# Patient Record
Sex: Male | Born: 1935 | Race: White | Hispanic: No | Smoking: Never smoker
Health system: Southern US, Community
[De-identification: ages and names within clinical notes are randomized; demographics above are authoritative.]

## PROBLEM LIST (undated history)

## (undated) DIAGNOSIS — F0390 Unspecified dementia without behavioral disturbance: Secondary | ICD-10-CM

## (undated) DIAGNOSIS — L409 Psoriasis, unspecified: Secondary | ICD-10-CM

## (undated) DIAGNOSIS — E785 Hyperlipidemia, unspecified: Secondary | ICD-10-CM

## (undated) DIAGNOSIS — I10 Essential (primary) hypertension: Secondary | ICD-10-CM

## (undated) DIAGNOSIS — E559 Vitamin D deficiency, unspecified: Secondary | ICD-10-CM

## (undated) DIAGNOSIS — E538 Deficiency of other specified B group vitamins: Secondary | ICD-10-CM

---

## 2016-09-30 ENCOUNTER — Encounter: Payer: Self-pay | Admitting: Emergency Medicine

## 2016-09-30 ENCOUNTER — Emergency Department: Payer: Medicare Other

## 2016-09-30 ENCOUNTER — Emergency Department
Admission: EM | Admit: 2016-09-30 | Discharge: 2016-09-30 | Disposition: A | Discharge status: Home / Self Care | Payer: Medicare Other | Attending: Emergency Medicine | Admitting: Emergency Medicine

## 2016-09-30 DIAGNOSIS — Y999 Unspecified external cause status: Secondary | ICD-10-CM | POA: Insufficient documentation

## 2016-09-30 DIAGNOSIS — S0990XA Unspecified injury of head, initial encounter: Secondary | ICD-10-CM | POA: Diagnosis present

## 2016-09-30 DIAGNOSIS — Y939 Activity, unspecified: Secondary | ICD-10-CM | POA: Insufficient documentation

## 2016-09-30 DIAGNOSIS — X58XXXA Exposure to other specified factors, initial encounter: Secondary | ICD-10-CM | POA: Diagnosis not present

## 2016-09-30 DIAGNOSIS — Y929 Unspecified place or not applicable: Secondary | ICD-10-CM | POA: Insufficient documentation

## 2016-09-30 DIAGNOSIS — S0101XA Laceration without foreign body of scalp, initial encounter: Secondary | ICD-10-CM | POA: Diagnosis not present

## 2016-09-30 HISTORY — DX: Unspecified dementia, unspecified severity, without behavioral disturbance, psychotic disturbance, mood disturbance, and anxiety: F03.90

## 2016-09-30 LAB — ETHANOL: Alcohol, Ethyl (B): 56 mg/dL — ABNORMAL HIGH (ref <5)

## 2016-09-30 MED ORDER — LIDOCAINE HCL (PF) 1 % IJ SOLN
INTRAMUSCULAR | Status: AC
  Administered 2016-09-30: 5 mL
  Filled 2016-09-30: qty 5

## 2016-09-30 NOTE — ED Notes (Signed)
Pt back from CT

## 2016-09-30 NOTE — ED Provider Notes (Signed)
Time Seen: Approximately 1349  I have reviewed the triage notes  Chief Complaint: Head Laceration   History of Present Illness: Kristopher Evans is a 81 y.o. male *who [facility] for some bleeding noticed over the back of his head. Patient had a fall and not sure exactly what time it occurred etc. Patient has a history of dementia. There is very mobile Patient "" snuck out"" last night to get some alcohol and they found himthis morning walking around the dry blood over the back of his head. Patient's not sure how the injury occurred. He denies any other locations of pain such as neck, thoracic, lumbar spine region   No past medical history on file.  There are no active problems to display for this patient.   No past surgical history on file.  No past surgical history on file.    Allergies:  Patient has no allergy information on record.  Family History: No family history on file.  Social History: Social History  Substance Use Topics  . Smoking status: Not on file  . Smokeless tobacco: Not on file  . Alcohol use Not on file     Review of Systems:   10 point review of systems was performed and was otherwise negative: atient has a history of dementia is able to offer some review of systems andfrom the family at the bedside Constitutional: No fever Eyes: No visual disturbances ENT: No sore throat, ear pain Cardiac: No chest pain Respiratory: No shortness of breath, wheezing, or stridor Abdomen: No abdominal pain, no vomiting, No diarrhea Endocrine: No weight loss, No night sweats Extremities: No peripheral edema, cyanosis Skin: No rashes, easy bruising Neurologic: No focal weakness, trouble with speech or swollowing Urologic: No dysuria, Hematuria, or urinary frequency   Physical Exam:  ED Triage Vitals  Enc Vitals Group     BP 09/30/16 1330 131/73     Pulse Rate 09/30/16 1330 (!) 101     Resp 09/30/16 1330 18     Temp 09/30/16 1330 98 F (36.7 C)      Temp src --      SpO2 09/30/16 1330 100 %     Weight 09/30/16 1331 175 lb 12.8 oz (79.7 kg)     Height 09/30/16 1331 5\' 8"  (1.727 m)     Head Circumference --      Peak Flow --      Pain Score --      Pain Loc --      Pain Edu? --      Excl. in GC? --     General: Awake , Alert , and Oriented times 3; GCS 15 Head: Normal cephalic , patient has a flap-like lesion with sharp edges in the shape of a triangle approximately 5 cm in size Minimal active bleeding and no obvious issection to the galea Eyes: Pupils equal , round, reactive to light Nose/Throat: No nasal drainage, patent upper airway without erythema or exudate.  Neck: Supple, Full range of motion, No anterior adenopathy or palpable thyroid masses Lungs: Clear to ascultation without wheezes , rhonchi, or rales Heart: Regular rate, regular rhythm without murmurs , gallops , or rubs Abdomen: Soft, non tender without rebound, guarding , or rigidity; bowel sounds positive and symmetric in all 4 quadrants. No organomegaly .        Extremities: 2 plus symmetric pulses. No edema, clubbing or cyanosis Neurologic: normal ambulation, Motor symmetric without deficits, sensory intact Skin: warm, dry, no rashes  Labs:   All laboratory work was reviewed including any pertinent negatives or positives listed below:  Labs Reviewed  ETHANOL    Radiology:  "Ct Head Wo Contrast  Result Date: 09/30/2016 CLINICAL DATA:  Posterior head injury. EXAM: CT HEAD WITHOUT CONTRAST TECHNIQUE: Contiguous axial images were obtained from the base of the skull through the vertex without intravenous contrast. COMPARISON:  None. FINDINGS: Brain: Diffusely enlarged ventricles and subarachnoid spaces. Patchy white matter low density in both cerebral hemispheres. 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 No intracranial hemorrhage, mass lesion or CT evidence of acute infarction. Vascular: Bilateral vertebral and cavernous carotid artery  atheromatous calcifications. Skull: Normal. Negative for fracture or focal lesion. Sinuses/Orbits: No acute finding. Other: Small posterior head laceration on the right. IMPRESSION: 1. No skull fracture or intracranial hemorrhage. 2. Atrophy and chronic small vessel white matter ischemic changes. 3. 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 Electronically Signed   By: Beckie SaltsSteven  Reid M.D.   On: 09/30/2016 14:17  "  I personally reviewed the radiologic studies   Procedures: * Patient's scalp was prepped and draped in sterile fashion and anesthetized locally with 1% lidocaine without epinephrine. Patient had 6 individual staples established. Patient tolerated procedure well and his wound was cleaned and dressed by the nursing staff.    ED Course:  Patient's stay here was uneventful and the patient remained hemodynamically stable. We are still awaiting his blood alcohol level. He is at his baseline mental status.the patient has no other signs of trauma and I felt that other laboratory work and x-ray evaluation was not necessary. His scalp laceration has very defined edges that he may have struck was sharp in nature.  Clinical Course      Assessment: * acute closed head injury 5 cm single layer closure non-complicated scalp laceration    Plan: Out patient Patient was advised to return immediately if condition worsens. Patient was advised to follow up with their primary care physician or other specialized physicians involved in their outpatient care. The patient and/or family member/power of attorney had laboratory results reviewed at the bedside. All questions and concerns were addressed and appropriate discharge instructions were distributed by the nursing staff.            Jennye MoccasinBrian S Quigley, MD 09/30/16 [phone]

## 2016-09-30 NOTE — Discharge Instructions (Signed)
Please see the current dressing in place for at least 24 hours. Staple removal in 1 week. After 24 hours, he can take the bandage off and clean with hydrogen peroxide and apply Neosporin.  Patient was advised to return immediately if condition worsens. Patient was advised to follow up with their primary care physician or other specialized physicians involved in their outpatient care. The patient and/or family member/power of attorney had laboratory results reviewed at the bedside. All questions and concerns were addressed and appropriate discharge instructions were distributed by the nursing staff.

## 2016-09-30 NOTE — ED Notes (Signed)
Cleaned wound with NS , dressed with gauze and wrapped with kerlex

## 2016-09-30 NOTE — ED Triage Notes (Signed)
[facility] bleeding in the back of his head due to unknown cause. Pt with dementia and snuck out last night for alcohol ; found this morning walking around with dried blood on the back of his head by staff

## 2016-11-29 DIAGNOSIS — F0280 Dementia in other diseases classified elsewhere without behavioral disturbance: Secondary | ICD-10-CM | POA: Diagnosis not present

## 2016-11-29 DIAGNOSIS — I739 Peripheral vascular disease, unspecified: Secondary | ICD-10-CM | POA: Diagnosis not present

## 2016-11-29 DIAGNOSIS — E782 Mixed hyperlipidemia: Secondary | ICD-10-CM | POA: Diagnosis not present

## 2016-11-29 DIAGNOSIS — F1020 Alcohol dependence, uncomplicated: Secondary | ICD-10-CM | POA: Diagnosis not present

## 2016-11-29 DIAGNOSIS — I10 Essential (primary) hypertension: Secondary | ICD-10-CM | POA: Diagnosis not present

## 2016-11-29 DIAGNOSIS — G300 Alzheimer's disease with early onset: Secondary | ICD-10-CM | POA: Diagnosis not present

## 2016-12-07 DIAGNOSIS — Z111 Encounter for screening for respiratory tuberculosis: Secondary | ICD-10-CM | POA: Diagnosis not present

## 2016-12-14 ENCOUNTER — Emergency Department: Payer: PPO

## 2016-12-14 ENCOUNTER — Emergency Department
Admission: EM | Admit: 2016-12-14 | Discharge: 2016-12-15 | Disposition: A | Discharge status: Home / Self Care | Payer: PPO | Attending: Emergency Medicine | Admitting: Emergency Medicine

## 2016-12-14 DIAGNOSIS — R410 Disorientation, unspecified: Secondary | ICD-10-CM | POA: Diagnosis not present

## 2016-12-14 DIAGNOSIS — G3109 Other frontotemporal dementia: Secondary | ICD-10-CM | POA: Diagnosis not present

## 2016-12-14 DIAGNOSIS — F02818 Dementia in other diseases classified elsewhere, unspecified severity, with other behavioral disturbance: Secondary | ICD-10-CM

## 2016-12-14 DIAGNOSIS — F0281 Dementia in other diseases classified elsewhere with behavioral disturbance: Secondary | ICD-10-CM

## 2016-12-14 DIAGNOSIS — R4182 Altered mental status, unspecified: Secondary | ICD-10-CM | POA: Diagnosis not present

## 2016-12-14 LAB — COMPREHENSIVE METABOLIC PANEL
ALBUMIN: 4 g/dL (ref 3.5–5.0)
ALT: 15 U/L — AB (ref 17–63)
AST: 21 U/L (ref 15–41)
Alkaline Phosphatase: 70 U/L (ref 38–126)
Anion gap: 6 (ref 5–15)
BILIRUBIN TOTAL: 0.7 mg/dL (ref 0.3–1.2)
BUN: 18 mg/dL (ref 6–20)
CHLORIDE: 103 mmol/L (ref 101–111)
CO2: 29 mmol/L (ref 22–32)
CREATININE: 1.23 mg/dL (ref 0.61–1.24)
Calcium: 8.8 mg/dL — ABNORMAL LOW (ref 8.9–10.3)
GFR calc Af Amer: >60 mL/min (ref >60)
GFR calc non Af Amer: 54 mL/min — ABNORMAL LOW (ref >60)
GLUCOSE: 104 mg/dL — AB (ref 65–99)
POTASSIUM: 3.5 mmol/L (ref 3.5–5.1)
Sodium: 138 mmol/L (ref 135–145)
Total Protein: 6.8 g/dL (ref 6.5–8.1)

## 2016-12-14 LAB — URINALYSIS, COMPLETE (UACMP) WITH MICROSCOPIC
BACTERIA UA: NONE SEEN
Bilirubin Urine: NEGATIVE
Glucose, UA: NEGATIVE mg/dL
HGB URINE DIPSTICK: NEGATIVE
Ketones, ur: NEGATIVE mg/dL
Leukocytes, UA: NEGATIVE
Nitrite: NEGATIVE
PROTEIN: NEGATIVE mg/dL
RBC / HPF: NONE SEEN RBC/hpf (ref 0–5)
Specific Gravity, Urine: 1.011 (ref 1.005–1.030)
pH: 7 (ref 5.0–8.0)

## 2016-12-14 LAB — CBC WITH DIFFERENTIAL/PLATELET
Basophils Absolute: 0.1 10*3/uL (ref 0–0.1)
Basophils Relative: 1 %
Eosinophils Absolute: 0.1 10*3/uL (ref 0–0.7)
Eosinophils Relative: 1 %
HEMATOCRIT: 40.4 % (ref 40.0–52.0)
HEMOGLOBIN: 13.6 g/dL (ref 13.0–18.0)
LYMPHS ABS: 3.2 10*3/uL (ref 1.0–3.6)
Lymphocytes Relative: 29 %
MCH: 32.1 pg (ref 26.0–34.0)
MCHC: 33.7 g/dL (ref 32.0–36.0)
MCV: 95.3 fL (ref 80.0–100.0)
MONO ABS: 0.9 10*3/uL (ref 0.2–1.0)
MONOS PCT: 8 %
NEUTROS ABS: 6.7 10*3/uL — AB (ref 1.4–6.5)
NEUTROS PCT: 61 %
Platelets: 271 10*3/uL (ref 150–440)
RBC: 4.24 MIL/uL — ABNORMAL LOW (ref 4.40–5.90)
RDW: 14.1 % (ref 11.5–14.5)
WBC: 11 10*3/uL — ABNORMAL HIGH (ref 3.8–10.6)

## 2016-12-14 LAB — SALICYLATE LEVEL: Salicylate Lvl: <7 mg/dL (ref 2.8–30.0)

## 2016-12-14 LAB — ACETAMINOPHEN LEVEL: Acetaminophen (Tylenol), Serum: <10 ug/mL — ABNORMAL LOW (ref 10–30)

## 2016-12-14 NOTE — ED Notes (Signed)
Pt provided with meal tray. Pt is dry, continues to decline offer for toilet and obtain a urine sample.

## 2016-12-14 NOTE — ED Notes (Signed)
Attempted to reach pt's niece at phone number provided. She does not answer. Called lamont, Interior and spatial designerdirector of [facility]. lamont states pt's niece "want's him evaulated and admitted." lamont to attempt to reach niece and have her call ed and speak with md.

## 2016-12-14 NOTE — BH Assessment (Signed)
Assessment Note  Kristopher Evans is an 82 y.o. male, with a prior history of dementia,  presenting to the ED after being found by EMS wandering around trying to get into vehicles.  [place].  Pt still thinks he lives in [place].  [facility].  Pt is oriented x 1.  Pt is not oriented to place and time.  Pt did not know the year and who the current president is [place].  Pt did not know he was in the hospitalization despite being told several times.  Pt thinks he's in a recreational facility.  Pt denies SI/HI.  Diagnosis: Dementia  Past Medical History:  Past Medical History:  Diagnosis Date  . Dementia     No past surgical history on file.  Family History: No family history on file.  Social History:  reports that he drinks alcohol. His tobacco and drug histories are not on file.  Additional Social History:  Alcohol / Drug Use Pain Medications: See PTA Prescriptions: See PTA Over the Counter: See PTA History of alcohol / drug use?: No history of alcohol / drug abuse  CIWA: CIWA-Ar BP: 129/84 Pulse Rate: 61 COWS:    Allergies:  Allergies  Allergen Reactions  . Penicillins     Home Medications:  (Not in a hospital admission)  OB/GYN Status:  No LMP for male patient.  General Assessment Data Location of Assessment: [facility] ED TTS Assessment: In system Is this a Tele or Face-to-Face Assessment?: Face-to-Face Is this an Initial Assessment or a Re-assessment for this encounter?: Initial Assessment Marital status: Single Maiden name: n/a Is patient pregnant?: No Pregnancy Status: No Living Arrangements: Other (Comment) ([facility]) Can pt return to current living arrangement?: Yes Admission Status: Voluntary Is patient capable of signing voluntary admission?: No Referral Source: Other Insurance type: Medicare     Crisis Care Plan Living Arrangements: Other (Comment)  ([facility]) Legal Guardian: Other: (self) Name of Psychiatrist: unknown Name of Therapist: unknown  Education Status Is patient currently in school?: No Current Grade: n/a Highest grade of school patient has completed: n/a Name of school: n/a Contact person: n/a  Risk to self with the past 6 months Suicidal Ideation: No Has patient been a risk to self within the past 6 months prior to admission? : No Suicidal Intent: No Has patient had any suicidal intent within the past 6 months prior to admission? : No Is patient at risk for suicide?: No Suicidal Plan?: No Has patient had any suicidal plan within the past 6 months prior to admission? : No Access to Means: No What has been your use of drugs/alcohol within the last 12 months?: None reported Previous Attempts/Gestures: No How many times?: 0 Other Self Harm Risks: Pt's altered mental state Triggers for Past Attempts: None known Intentional Self Injurious Behavior: None Family Suicide History: Unknown Recent stressful life event(s): Other (Comment) (Pt was moved from his other assisted living facility) Persecutory voices/beliefs?: No Depression: No Substance abuse history and/or treatment for substance abuse?: No Suicide prevention information given to non-admitted patients: Not applicable  Risk to Others within the past 6 months Homicidal Ideation: No Does patient have any lifetime risk of violence toward others beyond the six months prior to admission? : Unknown Thoughts of Harm to Others: No Current Homicidal Intent: No Current Homicidal Plan: No Access to Homicidal Means: No Identified Victim: none identified History of  harm to others?: No Assessment of Violence: None Noted Violent Behavior Description: none identified Does patient have access to weapons?: No Criminal Charges Pending?: No Does patient have a court date: No Is patient on probation?: No  Psychosis Hallucinations: None noted Delusions:  None noted  Mental Status Report Appearance/Hygiene: In scrubs Eye Contact: Good Motor Activity: Freedom of movement Speech: Incoherent Level of Consciousness: Alert Mood: Pleasant Affect: Appropriate to circumstance Anxiety Level: Minimal Thought Processes: Flight of Ideas Judgement: Unimpaired Orientation: Person Obsessive Compulsive Thoughts/Behaviors: None  Cognitive Functioning Concentration: Fair Memory: Recent Impaired IQ: Average Insight: Good Impulse Control: Good Appetite: Good Weight Loss: 0 Weight Gain: 0 Sleep: No Change Vegetative Symptoms: None  ADLScreening [facility] Assessment Services) Patient's cognitive ability adequate to safely complete daily activities?: Yes Patient able to express need for assistance with ADLs?: Yes Independently performs ADLs?: Yes (appropriate for developmental age)  Prior Inpatient Therapy Prior Inpatient Therapy: No Prior Therapy Dates: na Prior Therapy Facilty/Provider(s): na Reason for Treatment: na  Prior Outpatient Therapy Prior Outpatient Therapy: No Prior Therapy Dates: na Prior Therapy Facilty/Provider(s): na Reason for Treatment: na Does patient have an ACCT team?: No Does patient have Intensive In-House Services?  : No Does patient have [facility] services? : No Does patient have P4CC services?: No  ADL Screening (condition at time of admission) Patient's cognitive ability adequate to safely complete daily activities?: Yes Patient able to express need for assistance with ADLs?: Yes Independently performs ADLs?: Yes (appropriate for developmental age)       Abuse/Neglect Assessment (Assessment to be complete while patient is alone) Physical Abuse: Denies Verbal Abuse: Denies Sexual Abuse: Denies Exploitation of patient/patient's resources: Denies Self-Neglect: Denies Values / Beliefs Cultural Requests During Hospitalization: None Spiritual Requests During Hospitalization: None Consults Spiritual Care  Consult Needed: No Social Work Consult Needed: No Merchant navy officer (For Healthcare) Does Patient Have a Medical Advance Directive?: No (Pt has dementia and not able to answer.) Would patient like information on creating a medical advance directive?:  (Pt has dementia and not able to answer)    Additional Information 1:1 In Past 12 Months?: No CIRT Risk: No Elopement Risk: No Does patient have medical clearance?: No     Disposition:  Disposition Initial Assessment Completed for this Encounter: Yes Disposition of Patient: Other dispositions Other disposition(s): Other (Comment) (Pending Psych MD and Social Work consult)  On Site Evaluation by:   Reviewed with Physician:    [place] 12/14/2016 11:34 PM

## 2016-12-14 NOTE — ED Provider Notes (Signed)
[facility] Emergency Department Provider Note   ____________________________________________   None    (approximate)  I have reviewed the triage vital signs and the nursing notes.   HISTORY  Chief Complaint Altered Mental Status   HPI Kristopher Evans is a 82 y.o. male patient who used to live in [place] apparently traveled here recently is in a health care Hospital. He was found wandering around trying to get into cars outside. EMS picked him up he thought at that time he was in bed in [place]. Patient has been reoriented to the fact that he is in a hospital in [place] several times and still thinks he is in [place]. He does not know the day date the month or the year and does not know who the president is. He does not have any complaints of headache chest pain abdominal pain or any other thing. Review of records shows that he has been here at least since 2016 probably 15 as a diagnosis of Alzheimer's dementia without behavioral disturbance and essential hypertension   Past Medical History:  Diagnosis Date  . Dementia     There are no active problems to display for this patient.   No past surgical history on file.  Prior to Admission medications   Not on File    Allergies Penicillins  No family history on file.  Social History Social History  Substance Use Topics  . Smoking status: Unknown If Ever Smoked  . Smokeless tobacco: Not on file  . Alcohol use Yes    Review of Systems Constitutional: No fever/chills Eyes: No visual changes. ENT: No sore throat. Cardiovascular: Denies chest pain. Respiratory: Denies shortness of breath. Gastrointestinal: No abdominal pain.  No nausea, no vomiting.  No diarrhea.  No constipation. Genitourinary: Negative for dysuria. Musculoskeletal: Negative for back pain. Skin: Negative for rash. Neurological: Negative for headaches, focal weakness or numbness.  10-point ROS otherwise  negative.  ____________________________________________   PHYSICAL EXAM:  VITAL SIGNS: ED Triage Vitals [12/14/16 2049]  Enc Vitals Group     BP (!) 173/99     Pulse Rate 72     Resp 16     Temp 98.6 F (37 C)     Temp Source Oral     SpO2 99 %     Weight 134 lb (60.8 kg)     Height      Head Circumference      Peak Flow      Pain Score      Pain Loc      Pain Edu?      Excl. in GC?     Constitutional: Alert and oriented. Well appearing and in no acute distress. Eyes: Conjunctivae are normal. PERRL. EOMI. Head: Atraumatic. Nose: No congestion/rhinnorhea. Mouth/Throat: Mucous membranes are moist.  Oropharynx non-erythematous. Neck: No stridor.  Cardiovascular: Normal rate, regular rhythm. Grossly normal heart sounds.  Good peripheral circulation. Respiratory: Normal respiratory effort.  No retractions. Lungs CTAB. Gastrointestinal: Soft and nontender. No distention. No abdominal bruits. No CVA tenderness. Musculoskeletal: No lower extremity tenderness nor edema.  No joint effusions. Neurologic:  Normal speech and language. No gross focal neurologic deficits are appreciated. No gait instability. Skin:  Skin is warm, dry and intact. No rash noted. Psychiatric: Mood and affect are normal. Speech and behavior are normal.  ____________________________________________   LABS (all labs ordered are listed, but only abnormal results are displayed)  Labs Reviewed  COMPREHENSIVE METABOLIC PANEL - Abnormal; Notable for the following:  Result Value   Glucose, Bld 104 (*)    Calcium 8.8 (*)    ALT 15 (*)    GFR calc non Af Amer 54 (*)    All other components within normal limits  ACETAMINOPHEN LEVEL - Abnormal; Notable for the following:    Acetaminophen (Tylenol), Serum <10 (*)    All other components within normal limits  CBC WITH DIFFERENTIAL/PLATELET - Abnormal; Notable for the following:    WBC 11.0 (*)    RBC 4.24 (*)    Neutro Abs 6.7 (*)    All other  components within normal limits  SALICYLATE LEVEL  URINALYSIS, COMPLETE (UACMP) WITH MICROSCOPIC  CBG MONITORING, ED   ____________________________________________  EKG  EKG shows normal sinus rhythm rate of 79 normal axis no acute ST-T wave changes there is early R wave progression possibly due to lead placement ____________________________________________  RADIOLOGY  Study Result   CLINICAL DATA:  Confusion.  Hypertension.  EXAM: CT HEAD WITHOUT CONTRAST  TECHNIQUE: Contiguous axial images were obtained from the base of the skull through the vertex without intravenous contrast.  COMPARISON:  Head CT 09/30/2016  FINDINGS: Brain: No evidence of acute infarction, hemorrhage, hydrocephalus, extra-axial collection or mass lesion/mass effect. Stable generalized atrophy. Stable chronic small vessel ischemia. Encephalomalacia in the left frontal and parietal lobes and left cerebellum, unchanged from prior exam. Probable remote lacunar infarcts involving right basal ganglia.  Vascular: Atherosclerosis of skullbase vasculature without hyperdense vessel or abnormal calcification.  Skull: Normal. Negative for fracture or focal lesion.  Sinuses/Orbits: Paranasal sinuses and mastoid air cells are clear. The visualized orbits are unremarkable.  Other: None.  IMPRESSION: 1.  No acute intracranial abnormality. 2. Stable atrophy, chronic, and remote ischemia.   Electronically Signed   By: Rubye OaksMelanie  Ehinger M.D.   On: 12/14/2016 21:31   tudy Result   CLINICAL DATA:  Confusion.  Severe hypertension.  EXAM: PORTABLE CHEST 1 VIEW  COMPARISON:  None.  FINDINGS: Normal heart size and mediastinal contours. Atherosclerosis of the aortic arch. Mild interstitial coarsening which is likely chronic. No focal airspace disease, pleural effusion or pneumothorax. No acute osseous abnormality is seen.  IMPRESSION: Mild interstitial coarsening, likely  chronic.  Aortic atherosclerosis.   Electronically Signed   By: Rubye OaksMelanie  Ehinger M.D.   On: 12/14/2016 21:39     ____________________________________________   PROCEDURES  Procedure(s) performed:   Procedures  Critical Care performed:   ____________________________________________   INITIAL IMPRESSION / ASSESSMENT AND PLAN / ED COURSE  Pertinent labs & imaging results that were available during my care of the patient were reviewed by me and considered in my medical decision making (see chart for details).   The patient's niece, Ms Cristal DeerBoyleston (i believe that's the spelling) wishes to be called at the number below when the patient's disposition is made.  [phone]  Patient was in [place] up until 2 weeks ago ____________________________________________   FINAL CLINICAL IMPRESSION(S) / ED DIAGNOSES  Final diagnoses:  Other frontotemporal dementia with behavioral disturbance      NEW MEDICATIONS STARTED DURING THIS VISIT:  New Prescriptions   No medications on file     Note:  This document was prepared using Dragon voice recognition software and may include unintentional dictation errors.    Arnaldo NatalPaul F Ibeth Fahmy, MD 12/14/16 [phone]

## 2016-12-14 NOTE — ED Notes (Signed)
tts in to see pt.

## 2016-12-14 NOTE — ED Notes (Signed)
Pt has yellow metal necklace, yellow metal watch, grey metal glasses, grey metal bracelet, tan jacket, brown shoes, black pants and grey shirt.

## 2016-12-14 NOTE — ED Notes (Signed)
Pt watching tv after returning from ct scan. Pt denies needs.

## 2016-12-14 NOTE — ED Notes (Signed)
Pt declines need to urinate. Pt has depends in place, but states "i pee on my own". Pt states will call rn when he has to urinate.

## 2016-12-14 NOTE — ED Notes (Signed)
Patient transported to CT 

## 2016-12-14 NOTE — ED Notes (Signed)
Per ems on further clarification, pt was at [facility] an [facility]. Per ems pt "went off" on the propriator thus prompting her to call ems.

## 2016-12-14 NOTE — ED Triage Notes (Signed)
Per ems pt was found wandering attempting to get into vehicles. Pt unsure of where he is, thinks he in in [place]. Pt is alert to self only at this  Time. perrl 2mm and brisk. Pt moving all extremities.

## 2016-12-15 ENCOUNTER — Encounter: Payer: Self-pay | Admitting: Emergency Medicine

## 2016-12-15 MED ORDER — LORAZEPAM 0.5 MG PO TABS: 0.5000 mg | ORAL_TABLET | Freq: Three times a day (TID) | ORAL | 0 refills | Status: AC | PRN

## 2016-12-15 NOTE — Progress Notes (Signed)
LCSW met with patient and completed assessment. Patient has memory issues and unable to remember phone numbers and names.  LCSW contacted family members Katharine Look [phone]) She confirmed that patient is currently in an assisted living facility and has become anxious at night then wanders away. Family requested patient  To be prescribed medication to calm him down/assist with sleep/anxiety for next 3 days until they can bring him to Dr Frazier Richards on Monday.  He has recently transitioned and relocated a month ago and now is a bit disoriented. LCSW agreed to create a helpful series of handouts of SNF, Memory Care facilities list ( short term private pay)[facility] way Resource list, Proofreader,  Senior day program resources, blank manual Fl2 indicated pts family doctor to complete.Dementia Services/[employer] handouts.  They will come and pick up patient in an hour or so. Assessment completed.  [employer] LCSW [phone]

## 2016-12-15 NOTE — ED Notes (Signed)
Pt changed into burgandy scrubs. Pt has retained eyeglasses, all other belongings placed in a labeled bag [facility]. Bag given to noel, rn. Report to noel, rn.

## 2016-12-15 NOTE — ED Provider Notes (Signed)
Clinical Course as of Dec 16 643  Sat Dec 15, 2016  [id number] Reviewed telepsych recommendations.  They did not feel that the patient meets inpatient nor IVC criteria.  They felt that the patient can go home and be supervised in a controlled setting.  However, the psychiatrist was unable to reach the family for collateral.  Social work consult is still pending today and disposition will depend upon the interactions between social work and the family as well as the patient.  [CF]    Clinical Course User Index [CF] Loleta Roseory Braydon Kullman, MD      Loleta Roseory Ruthell Feigenbaum, MD 12/15/16 [phone]

## 2016-12-15 NOTE — ED Notes (Addendum)
DR Hermelinda MedicusAlvarado, [facility]   Per conversation with April, RN, and Jaynie CollinsLamont, Interior and spatial designerdirector at group home, [phone],  pt IS welcome to return to [facility] [address], [phone]

## 2016-12-15 NOTE — Clinical Social Work Note (Signed)
Clinical Social Work Assessment  Patient Details  Name: Kristopher Evans MRN: [id number] Date of Birth: December 21, 1934  Date of referral:  12/15/16               Reason for consult:  Family Concerns, [employer]                Permission sought to share information with:  Family Supports Permission granted to share information::  Yes, Verbal Permission Granted  Name::     Katharine Look [phone] [employer])  Agency::     Relationship::     Contact Information:     Housing/Transportation Living arrangements for the past 2 months:  Apartment (Assisted Living) Source of Information:  Patient, Adult Children Patient Interpreter Needed:  None Criminal Activity/Legal Involvement Pertinent to Current Situation/Hospitalization:  No - Comment as needed Significant Relationships:  Other Family Members Lives with:  Self, Facility Resident Do you feel safe going back to the place where you live?    Need for family participation in patient care:  Yes (Comment)  Care giving concerns: Family would like him to obtain medications to assist with anxiety   Social Worker assessment / plan: LCSW met with patient who is oriented to self x1. He gave verbal consent to speak to his niece but could not remember her name or number. He reports he lives alone in an apartment and is able to manage dressing, walking, showering and eating food. LCSW called Niece and she reported he has recently moved to assisted living and with him walking off and being disoriented they would like him in a Memory care locked unit and medications until he can been seen by  His own doctor. Governor Specking Monday March5th. Safety concerns were reviewed with family. Several resources were compiled and explained to niece for her to assist in meeting this patients current needs. She will pick up patient and return him home and assist him.   Employment status:  Retired Forensic scientist:   Agricultural engineer) PT Recommendations:     Information / Referral to community resources:   Set designer, Dementia services, SNF, Memory Care facilities handout)  Patient/Family's Response to care: Will start the process to have him placed in locked unit  Patient/Family's Understanding of and Emotional Response to Diagnosis, Current Treatment, and Prognosis: patient is not aware he has memory issues. His family has safety concerns and will supervise and place him in locked facility.  Emotional Assessment Appearance:  Appears stated age, Well-Groomed Attitude/Demeanor/Rapport:   (Polite calm) Affect (typically observed):  Appropriate, Unable to Assess Orientation:  Oriented to Self Alcohol / Substance use:  Not Applicable Psych involvement (Current and /or in the community):  No (Comment)  Discharge Needs  Concerns to be addressed:  Care Coordination, Home Safety Concerns Readmission within the last 30 days:  No Current discharge risk:  Lives alone Barriers to Discharge:  No Barriers Identified   Joana Reamer, LCSW 12/15/2016, 8:51 AM

## 2016-12-15 NOTE — ED Notes (Signed)
Pt ambulated to restroom without difficulty

## 2016-12-15 NOTE — ED Provider Notes (Signed)
Patient is a 82 year old male with dementia who is deemed to not meet criteria for inpatient treat him and her IVC. Plan is for social work to follow-up with patient's family for disposition planning.  Physical Exam  BP (!) 148/92 (BP Location: Right Arm)   Pulse 70   Temp 98.3 F (36.8 C) (Oral)   Resp 18   Wt 134 lb (60.8 kg)   SpO2 100%   BMI 20.37 kg/m  ----------------------------------------- 8:51 AM on 12/15/2016 -----------------------------------------   Physical Exam Patient awake and alert at this time. No distress noted. ED Course  Procedures  MDM Social worker, Claudine, able to contact the patient's niece who will be taking the patient home for further care and also to discuss further planning with the patient in a skilled nursing Center or memory care unit. Family is also requesting when necessary Ativan for the patient for sleep.  Feel that this medicine may also be necessary for possible agitation.       Myrna Blazeravid Matthew Schaevitz, MD 12/15/16 [phone]

## 2016-12-15 NOTE — ED Notes (Signed)
Attempted to call pt's niece (?guardian?) no answer

## 2016-12-15 NOTE — NC FL2 (Signed)
  [place] MEDICAID FL2 LEVEL OF CARE SCREENING TOOL     IDENTIFICATION  Patient Name: Kristopher JohnsJohn Reicher Birthdate: 12/30/1934 Sex: male Admission Date (Current Location): 12/14/2016  [facility] [place]   Facility and Address:  [facility], [address], [place], [place] [address]      Provider Number: [id number]  Attending Physician Name and Address:  No att. providers found  Relative Name and Phone Number:       Current Level of Care: (S) Other (Comment) (ALF) Recommended Level of Care: Memory Care Prior Approval Number:    Date Approved/Denied:   PASRR Number: [id number]  Discharge Plan: Other (Comment) (ALF)    Current Diagnoses: There are no active problems to display for this patient.   Orientation RESPIRATION BLADDER Height & Weight     Self  Normal Continent Weight: 134 lb (60.8 kg) Height:     BEHAVIORAL SYMPTOMS/MOOD NEUROLOGICAL BOWEL NUTRITION STATUS      Continent Diet (normal)  AMBULATORY STATUS COMMUNICATION OF NEEDS Skin   Independent Verbally Normal                       Personal Care Assistance Level of Assistance  Bathing, Feeding, Dressing, Total care Bathing Assistance: Limited assistance Feeding assistance: Independent Dressing Assistance: Independent Total Care Assistance: Limited assistance   Functional Limitations Info  Sight, Hearing, Speech Sight Info: Adequate Hearing Info: Adequate Speech Info: Adequate    SPECIAL CARE FACTORS FREQUENCY                       Contractures Contractures Info: Not present    Additional Factors Info  Allergies   Allergies Info: Penicillans           Current Medications (12/15/2016):  This is the current hospital active medication list No current facility-administered medications for this encounter.    Current Outpatient Prescriptions  Medication Sig Dispense Refill  . LORazepam (ATIVAN) 0.5 MG tablet Take 1 tablet (0.5 mg total) by  mouth every 8 (eight) hours as needed for anxiety or sleep. 10 tablet 0     Discharge Medications: Please see discharge summary for a list of discharge medications.  Relevant Imaging Results:  Relevant Lab Results:   Additional Information SSN [id number]  Cheron SchaumannBandi, Khylei Wilms M, [place]

## 2016-12-17 DIAGNOSIS — F0280 Dementia in other diseases classified elsewhere without behavioral disturbance: Secondary | ICD-10-CM | POA: Diagnosis not present

## 2016-12-17 DIAGNOSIS — G300 Alzheimer's disease with early onset: Secondary | ICD-10-CM | POA: Diagnosis not present

## 2016-12-17 DIAGNOSIS — F1020 Alcohol dependence, uncomplicated: Secondary | ICD-10-CM | POA: Diagnosis not present

## 2016-12-31 DIAGNOSIS — F0280 Dementia in other diseases classified elsewhere without behavioral disturbance: Secondary | ICD-10-CM | POA: Diagnosis not present

## 2016-12-31 DIAGNOSIS — F1020 Alcohol dependence, uncomplicated: Secondary | ICD-10-CM | POA: Diagnosis not present

## 2016-12-31 DIAGNOSIS — G300 Alzheimer's disease with early onset: Secondary | ICD-10-CM | POA: Diagnosis not present

## 2017-04-23 DIAGNOSIS — G300 Alzheimer's disease with early onset: Secondary | ICD-10-CM | POA: Diagnosis not present

## 2017-04-23 DIAGNOSIS — F0280 Dementia in other diseases classified elsewhere without behavioral disturbance: Secondary | ICD-10-CM | POA: Diagnosis not present

## 2017-04-23 DIAGNOSIS — F1020 Alcohol dependence, uncomplicated: Secondary | ICD-10-CM | POA: Diagnosis not present

## 2017-04-23 DIAGNOSIS — I739 Peripheral vascular disease, unspecified: Secondary | ICD-10-CM | POA: Diagnosis not present

## 2017-04-23 DIAGNOSIS — I10 Essential (primary) hypertension: Secondary | ICD-10-CM | POA: Diagnosis not present

## 2017-09-03 DIAGNOSIS — G300 Alzheimer's disease with early onset: Secondary | ICD-10-CM | POA: Diagnosis not present

## 2017-09-03 DIAGNOSIS — I10 Essential (primary) hypertension: Secondary | ICD-10-CM | POA: Diagnosis not present

## 2017-09-03 DIAGNOSIS — I739 Peripheral vascular disease, unspecified: Secondary | ICD-10-CM | POA: Diagnosis not present

## 2017-09-03 DIAGNOSIS — Z0000 Encounter for general adult medical examination without abnormal findings: Secondary | ICD-10-CM | POA: Diagnosis not present

## 2017-09-03 DIAGNOSIS — F0280 Dementia in other diseases classified elsewhere without behavioral disturbance: Secondary | ICD-10-CM | POA: Diagnosis not present

## 2017-09-03 DIAGNOSIS — F1020 Alcohol dependence, uncomplicated: Secondary | ICD-10-CM | POA: Diagnosis not present

## 2017-10-22 DIAGNOSIS — K4090 Unilateral inguinal hernia, without obstruction or gangrene, not specified as recurrent: Secondary | ICD-10-CM | POA: Diagnosis not present

## 2017-12-21 DIAGNOSIS — K4090 Unilateral inguinal hernia, without obstruction or gangrene, not specified as recurrent: Secondary | ICD-10-CM | POA: Diagnosis not present

## 2017-12-21 DIAGNOSIS — I10 Essential (primary) hypertension: Secondary | ICD-10-CM | POA: Diagnosis not present

## 2017-12-21 DIAGNOSIS — L409 Psoriasis, unspecified: Secondary | ICD-10-CM | POA: Diagnosis not present

## 2017-12-21 DIAGNOSIS — F0280 Dementia in other diseases classified elsewhere without behavioral disturbance: Secondary | ICD-10-CM | POA: Diagnosis not present

## 2017-12-21 DIAGNOSIS — I739 Peripheral vascular disease, unspecified: Secondary | ICD-10-CM | POA: Diagnosis not present

## 2017-12-21 DIAGNOSIS — Z7982 Long term (current) use of aspirin: Secondary | ICD-10-CM | POA: Diagnosis not present

## 2017-12-21 DIAGNOSIS — F419 Anxiety disorder, unspecified: Secondary | ICD-10-CM | POA: Diagnosis not present

## 2017-12-21 DIAGNOSIS — E782 Mixed hyperlipidemia: Secondary | ICD-10-CM | POA: Diagnosis not present

## 2017-12-21 DIAGNOSIS — Z9181 History of falling: Secondary | ICD-10-CM | POA: Diagnosis not present

## 2017-12-21 DIAGNOSIS — G309 Alzheimer's disease, unspecified: Secondary | ICD-10-CM | POA: Diagnosis not present

## 2018-03-04 DIAGNOSIS — L409 Psoriasis, unspecified: Secondary | ICD-10-CM | POA: Diagnosis not present

## 2018-03-04 DIAGNOSIS — I10 Essential (primary) hypertension: Secondary | ICD-10-CM | POA: Diagnosis not present

## 2018-03-26 ENCOUNTER — Emergency Department
Admission: EM | Admit: 2018-03-26 | Discharge: 2018-03-26 | Disposition: A | Discharge status: Skilled Nursing Facility | Payer: PPO | Attending: Emergency Medicine | Admitting: Emergency Medicine

## 2018-03-26 ENCOUNTER — Other Ambulatory Visit: Payer: Self-pay

## 2018-03-26 DIAGNOSIS — F0391 Unspecified dementia with behavioral disturbance: Secondary | ICD-10-CM | POA: Diagnosis not present

## 2018-03-26 DIAGNOSIS — F918 Other conduct disorders: Secondary | ICD-10-CM | POA: Diagnosis present

## 2018-03-26 DIAGNOSIS — Z7982 Long term (current) use of aspirin: Secondary | ICD-10-CM | POA: Diagnosis not present

## 2018-03-26 DIAGNOSIS — R4182 Altered mental status, unspecified: Secondary | ICD-10-CM | POA: Diagnosis not present

## 2018-03-26 DIAGNOSIS — Z79899 Other long term (current) drug therapy: Secondary | ICD-10-CM | POA: Diagnosis not present

## 2018-03-26 DIAGNOSIS — Z743 Need for continuous supervision: Secondary | ICD-10-CM | POA: Diagnosis not present

## 2018-03-26 DIAGNOSIS — R456 Violent behavior: Secondary | ICD-10-CM | POA: Diagnosis not present

## 2018-03-26 LAB — URINALYSIS, COMPLETE (UACMP) WITH MICROSCOPIC
BACTERIA UA: NONE SEEN
Bilirubin Urine: NEGATIVE
Glucose, UA: NEGATIVE mg/dL
Hgb urine dipstick: NEGATIVE
Ketones, ur: NEGATIVE mg/dL
Leukocytes, UA: NEGATIVE
Nitrite: NEGATIVE
PROTEIN: NEGATIVE mg/dL
SPECIFIC GRAVITY, URINE: 1.011 (ref 1.005–1.030)
SQUAMOUS EPITHELIAL / LPF: NONE SEEN (ref 0–5)
pH: 7 (ref 5.0–8.0)

## 2018-03-26 NOTE — ED Notes (Signed)
Pt attempting to leave. Pt states car is outside and "I been here long enough, I'm ready to go". Pt redirected and given food and drink to encourage the patient to provide a urine sample.

## 2018-03-26 NOTE — ED Notes (Signed)
Meal tray and po fluids provided 

## 2018-03-26 NOTE — ED Notes (Signed)
Report to lorrie, rn.  

## 2018-03-26 NOTE — ED Notes (Signed)
Pt currently up and moving around the room. Pt given a urinal and encouraged to provide urine sample.

## 2018-03-26 NOTE — ED Provider Notes (Signed)
 -----------------------------------------   2:37 PM on 03/26/2018 -----------------------------------------  In the ED patient remains calm and cooperative.  He ate a full lunch tray.  He provided a urine sample on request which does not show any sign of infection.  Patient will be discharged back to his nursing home to for continued care and outpatient follow-up.   Sharman CheekStafford, Gavynn Duvall, MD 03/26/18 [phone]

## 2018-03-26 NOTE — ED Provider Notes (Signed)
[facility] Emergency Department Provider Note    First MD Initiated Contact with Patient 03/26/18 0615     (approximate)  I have reviewed the triage vital signs and the nursing notes.   HISTORY  Chief Complaint agressive behavior   HPI Kristopher Evans is a 83 y.o. male with history of dementia presents to the emergency department from [facility] memory care unit secondary to "aggressive behavior per EMS.  EMS was notified by a med tech at [employer] [facility] of the patient was aggressive towards her and as a result she notified EMS services.  Patient presents to the emergency department calm and cooperative.   Past Medical History:  Diagnosis Date  . Dementia     There are no active problems to display for this patient.   Past surgical history None  Prior to Admission medications   Medication Sig Start Date End Date Taking? Authorizing Provider  aspirin EC 81 MG tablet Take 81 mg by mouth daily.   Yes [provider]  cholecalciferol (VITAMIN D) 1000 units tablet Take 2,000 Units by mouth daily.   Yes [provider]  liver oil-zinc oxide (DESITIN) 40 % ointment Apply 1 application topically as needed for irritation.   Yes [provider]  QUEtiapine (SEROQUEL) 25 MG tablet Take 25 mg by mouth every 8 (eight) hours as needed.   Yes [provider]  vitamin B-12 (CYANOCOBALAMIN) 1000 MCG tablet Take 1,000 mcg by mouth daily.   Yes [provider]    Allergies Penicillins  No family history on file.  Social History Social History   Tobacco Use  . Smoking status: Never Smoker  . Smokeless tobacco: Never Used  Substance Use Topics  . Alcohol use: Yes  . Drug use: Not on file    Review of Systems Constitutional: No fever/chills Eyes: No visual changes. ENT: No sore throat. Cardiovascular: Denies chest pain. Respiratory: Denies shortness of breath. Gastrointestinal: No abdominal pain.  No nausea, no  vomiting.  No diarrhea.  No constipation. Genitourinary: Negative for dysuria. Musculoskeletal: Negative for neck pain.  Negative for back pain. Integumentary: Negative for rash. Neurological: Negative for headaches, focal weakness or numbness.   ____________________________________________   PHYSICAL EXAM:  VITAL SIGNS: ED Triage Vitals  Enc Vitals Group     BP 03/26/18 0435 (!) 172/92     Pulse Rate 03/26/18 0435 86     Resp 03/26/18 0435 16     Temp 03/26/18 0435 98 F (36.7 C)     Temp Source 03/26/18 0435 Oral     SpO2 03/26/18 0435 99 %     Weight 03/26/18 0436 77.1 kg (170 lb)     Height 03/26/18 0436 1.676 m (5\' 6" )     Head Circumference --      Peak Flow --      Pain Score 03/26/18 0436 0     Pain Loc --      Pain Edu? --      Excl. in GC? --     Constitutional: Alert and oriented. Well appearing and in no acute distress. Eyes: Conjunctivae are normal. Head: Atraumatic. Mouth/Throat: Mucous membranes are moist.  Oropharynx non-erythematous. Neck: No stridor.   Cardiovascular: Normal rate, regular rhythm. Good peripheral circulation. Grossly normal heart sounds. Respiratory: Normal respiratory effort.  No retractions. Lungs CTAB. Gastrointestinal: Left lower quadrant tenderness to palpation. No distention.  Musculoskeletal: No lower extremity tenderness nor edema. No gross deformities of extremities. Neurologic:  Normal speech  and language. No gross focal neurologic deficits are appreciated.  Skin:  Skin is warm, dry and intact. No rash noted. Psychiatric: Mood and affect are normal. Speech and behavior are normal.     Procedures   ____________________________________________   INITIAL IMPRESSION / ASSESSMENT AND PLAN / ED COURSE  As part of my medical decision making, I reviewed the following data within the electronic MEDICAL RECORD NUMBER   83 year old male with history of dementia presenting with above-stated history secondary to aggressive behavior  [facility].  Patient calm cooperative during entire emergency department stay.  Social work consult pending  ____________________________________________  FINAL CLINICAL IMPRESSION(S) / ED DIAGNOSES  Final diagnoses:  Dementia with behavioral disturbance, unspecified dementia type     MEDICATIONS GIVEN DURING THIS VISIT:  Medications - No data to display   ED Discharge Orders    None       Note:  This document was prepared using Dragon voice recognition software and may include unintentional dictation errors.    Darci CurrentBrown, Watchung N, MD 03/26/18 [phone]

## 2018-03-26 NOTE — Clinical Social Work Note (Addendum)
CSW received a consult for "Patient with dementia from [facility] memory care unit with aggressive behavior. Patient calm and cooperative in the emergency department." CSW staffed with EDP Dr. Scotty CourtStafford. Patient ready for discharge. [facility]. CSW spoke with [facility] Coord[facility] Geralynn RileLisa Perry [phone] who asked about patient's behavior. CSW expressed that patient has been calm and cooperative since he's been at the ED. Ms. Marina Goodellerry asked if patient was given a urinalysis and if a prescription can be provided for agitation, since this is not the first time patient has demonstrated agitated behavior. CSW asked Ms. Marina GoodellPerry if patient's Primary Care Physician (PCP) has been consulted for possible medication management. Ms. Marina Goodellerry stated she is not sure and would have to check patient's records. CSW staffed with EDP Dr. Scotty CourtStafford, who will order a urinalysis, but recommends patient be seen by his PCP for evaluation of medication intervention for agitation. CSW updated EDRN Lorrie. CSW informed Ms. Marina GoodellPerry of EDP's recommendations. Ms. Marina Goodellerry agreeable. EDRN Lorrie will call report to Geralynn RileLisa Perry at [phone] after urinalysis results for patient discharge. Per Ms. Marina GoodellPerry patient to be transported via EMS. CSW signing off as no further Social Work intervention needed.   Corlis HoveJeneya Jazlynn Nemetz, Theresia MajorsLCSWA, [facility] Clinical Social Worker-Emergency Department [phone]

## 2018-03-26 NOTE — ED Triage Notes (Signed)
Pt from [facility] per ems with aggressive behavior. Per ems pt became aggressive with [facility]. Pt with history of dementia. Pt is alert to self and place "hospital" only. Pt denies pain.

## 2018-04-11 DIAGNOSIS — E782 Mixed hyperlipidemia: Secondary | ICD-10-CM | POA: Diagnosis not present

## 2018-04-11 DIAGNOSIS — F0280 Dementia in other diseases classified elsewhere without behavioral disturbance: Secondary | ICD-10-CM | POA: Diagnosis not present

## 2018-04-11 DIAGNOSIS — Z87891 Personal history of nicotine dependence: Secondary | ICD-10-CM | POA: Diagnosis not present

## 2018-04-11 DIAGNOSIS — G309 Alzheimer's disease, unspecified: Secondary | ICD-10-CM | POA: Diagnosis not present

## 2018-04-11 DIAGNOSIS — I10 Essential (primary) hypertension: Secondary | ICD-10-CM | POA: Diagnosis not present

## 2018-04-11 DIAGNOSIS — I739 Peripheral vascular disease, unspecified: Secondary | ICD-10-CM | POA: Diagnosis not present

## 2018-04-11 DIAGNOSIS — L408 Other psoriasis: Secondary | ICD-10-CM | POA: Diagnosis not present

## 2018-04-11 DIAGNOSIS — Z7982 Long term (current) use of aspirin: Secondary | ICD-10-CM | POA: Diagnosis not present

## 2018-04-26 DIAGNOSIS — I739 Peripheral vascular disease, unspecified: Secondary | ICD-10-CM | POA: Diagnosis not present

## 2018-04-26 DIAGNOSIS — Z87891 Personal history of nicotine dependence: Secondary | ICD-10-CM | POA: Diagnosis not present

## 2018-04-26 DIAGNOSIS — F0280 Dementia in other diseases classified elsewhere without behavioral disturbance: Secondary | ICD-10-CM | POA: Diagnosis not present

## 2018-04-26 DIAGNOSIS — E782 Mixed hyperlipidemia: Secondary | ICD-10-CM | POA: Diagnosis not present

## 2018-04-26 DIAGNOSIS — G309 Alzheimer's disease, unspecified: Secondary | ICD-10-CM | POA: Diagnosis not present

## 2018-04-26 DIAGNOSIS — I10 Essential (primary) hypertension: Secondary | ICD-10-CM | POA: Diagnosis not present

## 2018-04-26 DIAGNOSIS — L408 Other psoriasis: Secondary | ICD-10-CM | POA: Diagnosis not present

## 2018-04-26 DIAGNOSIS — Z7982 Long term (current) use of aspirin: Secondary | ICD-10-CM | POA: Diagnosis not present

## 2018-05-14 DIAGNOSIS — G309 Alzheimer's disease, unspecified: Secondary | ICD-10-CM | POA: Diagnosis not present

## 2018-05-14 DIAGNOSIS — E782 Mixed hyperlipidemia: Secondary | ICD-10-CM | POA: Diagnosis not present

## 2018-05-14 DIAGNOSIS — F419 Anxiety disorder, unspecified: Secondary | ICD-10-CM | POA: Diagnosis not present

## 2018-05-14 DIAGNOSIS — I10 Essential (primary) hypertension: Secondary | ICD-10-CM | POA: Diagnosis not present

## 2018-05-14 DIAGNOSIS — Z9181 History of falling: Secondary | ICD-10-CM | POA: Diagnosis not present

## 2018-05-14 DIAGNOSIS — Z87891 Personal history of nicotine dependence: Secondary | ICD-10-CM | POA: Diagnosis not present

## 2018-05-14 DIAGNOSIS — F1010 Alcohol abuse, uncomplicated: Secondary | ICD-10-CM | POA: Diagnosis not present

## 2018-05-14 DIAGNOSIS — Z7982 Long term (current) use of aspirin: Secondary | ICD-10-CM | POA: Diagnosis not present

## 2018-05-14 DIAGNOSIS — K4090 Unilateral inguinal hernia, without obstruction or gangrene, not specified as recurrent: Secondary | ICD-10-CM | POA: Diagnosis not present

## 2018-05-14 DIAGNOSIS — I739 Peripheral vascular disease, unspecified: Secondary | ICD-10-CM | POA: Diagnosis not present

## 2018-05-14 DIAGNOSIS — F0280 Dementia in other diseases classified elsewhere without behavioral disturbance: Secondary | ICD-10-CM | POA: Diagnosis not present

## 2018-05-14 DIAGNOSIS — L409 Psoriasis, unspecified: Secondary | ICD-10-CM | POA: Diagnosis not present

## 2018-06-24 ENCOUNTER — Emergency Department
Admission: EM | Admit: 2018-06-24 | Discharge: 2018-06-24 | Disposition: A | Discharge status: Home / Self Care | Payer: PPO | Attending: Emergency Medicine | Admitting: Emergency Medicine

## 2018-06-24 ENCOUNTER — Emergency Department: Payer: PPO

## 2018-06-24 DIAGNOSIS — H5710 Ocular pain, unspecified eye: Secondary | ICD-10-CM | POA: Diagnosis not present

## 2018-06-24 DIAGNOSIS — Z79899 Other long term (current) drug therapy: Secondary | ICD-10-CM | POA: Insufficient documentation

## 2018-06-24 DIAGNOSIS — H10402 Unspecified chronic conjunctivitis, left eye: Secondary | ICD-10-CM | POA: Insufficient documentation

## 2018-06-24 DIAGNOSIS — R4182 Altered mental status, unspecified: Secondary | ICD-10-CM | POA: Diagnosis not present

## 2018-06-24 DIAGNOSIS — H10401 Unspecified chronic conjunctivitis, right eye: Secondary | ICD-10-CM | POA: Diagnosis not present

## 2018-06-24 DIAGNOSIS — H5789 Other specified disorders of eye and adnexa: Secondary | ICD-10-CM | POA: Diagnosis present

## 2018-06-24 DIAGNOSIS — Z7982 Long term (current) use of aspirin: Secondary | ICD-10-CM | POA: Diagnosis not present

## 2018-06-24 DIAGNOSIS — F0390 Unspecified dementia without behavioral disturbance: Secondary | ICD-10-CM | POA: Insufficient documentation

## 2018-06-24 DIAGNOSIS — H10403 Unspecified chronic conjunctivitis, bilateral: Secondary | ICD-10-CM

## 2018-06-24 DIAGNOSIS — H1033 Unspecified acute conjunctivitis, bilateral: Secondary | ICD-10-CM | POA: Diagnosis not present

## 2018-06-24 DIAGNOSIS — Z743 Need for continuous supervision: Secondary | ICD-10-CM | POA: Diagnosis not present

## 2018-06-24 DIAGNOSIS — L03213 Periorbital cellulitis: Secondary | ICD-10-CM | POA: Diagnosis not present

## 2018-06-24 DIAGNOSIS — H05011 Cellulitis of right orbit: Secondary | ICD-10-CM | POA: Diagnosis not present

## 2018-06-24 LAB — CBC WITH DIFFERENTIAL/PLATELET
Basophils Absolute: 0.1 10*3/uL (ref 0–0.1)
Basophils Relative: 1 %
Eosinophils Absolute: 0.6 10*3/uL (ref 0–0.7)
Eosinophils Relative: 5 %
HEMATOCRIT: 38.1 % — AB (ref 40.0–52.0)
HEMOGLOBIN: 12.9 g/dL — AB (ref 13.0–18.0)
LYMPHS PCT: 41 %
Lymphs Abs: 4.8 10*3/uL — ABNORMAL HIGH (ref 1.0–3.6)
MCH: 31.7 pg (ref 26.0–34.0)
MCHC: 33.8 g/dL (ref 32.0–36.0)
MCV: 93.8 fL (ref 80.0–100.0)
MONO ABS: 1 10*3/uL (ref 0.2–1.0)
MONOS PCT: 9 %
NEUTROS ABS: 5.1 10*3/uL (ref 1.4–6.5)
NEUTROS PCT: 44 %
Platelets: 288 10*3/uL (ref 150–440)
RBC: 4.07 MIL/uL — ABNORMAL LOW (ref 4.40–5.90)
RDW: 14 % (ref 11.5–14.5)
WBC: 11.6 10*3/uL — ABNORMAL HIGH (ref 3.8–10.6)

## 2018-06-24 LAB — BASIC METABOLIC PANEL
ANION GAP: 6 (ref 5–15)
BUN: 16 mg/dL (ref 8–23)
CHLORIDE: 110 mmol/L (ref 98–111)
CO2: 28 mmol/L (ref 22–32)
Calcium: 8.2 mg/dL — ABNORMAL LOW (ref 8.9–10.3)
Creatinine, Ser: 1.05 mg/dL (ref 0.61–1.24)
GFR calc Af Amer: >60 mL/min (ref >60)
GFR calc non Af Amer: >60 mL/min (ref >60)
GLUCOSE: 92 mg/dL (ref 70–99)
Potassium: 3.7 mmol/L (ref 3.5–5.1)
Sodium: 144 mmol/L (ref 135–145)

## 2018-06-24 MED ORDER — CLINDAMYCIN HCL 150 MG PO CAPS: 450.0000 mg | ORAL_CAPSULE | Freq: Three times a day (TID) | ORAL | 0 refills | Status: AC

## 2018-06-24 MED ORDER — ERYTHROMYCIN 5 MG/GM OP OINT: 1.0000 "application " | TOPICAL_OINTMENT | Freq: Every day | OPHTHALMIC | 1 refills | Status: AC

## 2018-06-24 MED ORDER — MOXIFLOXACIN HCL 0.5 % OP SOLN: OPHTHALMIC | 0 refills | Status: AC

## 2018-06-24 MED ORDER — IOHEXOL 300 MG/ML  SOLN
75.0000 mL | Freq: Once | INTRAMUSCULAR | Status: AC | PRN
  Administered 2018-06-24: 75 mL via INTRAVENOUS

## 2018-06-24 MED ORDER — TETRACAINE HCL 0.5 % OP SOLN
OPHTHALMIC | Status: AC
  Administered 2018-06-24: 08:00:00
  Filled 2018-06-24: qty 4

## 2018-06-24 MED ORDER — FLUORESCEIN SODIUM 1 MG OP STRP
ORAL_STRIP | OPHTHALMIC | Status: AC
  Administered 2018-06-24: 08:00:00
  Filled 2018-06-24: qty 1

## 2018-06-24 MED ORDER — SODIUM CHLORIDE 0.9 % IV BOLUS
1000.0000 mL | Freq: Once | INTRAVENOUS | Status: AC
  Administered 2018-06-24: 1000 mL via INTRAVENOUS

## 2018-06-24 MED ORDER — CLINDAMYCIN HCL 150 MG PO CAPS
450.0000 mg | ORAL_CAPSULE | Freq: Once | ORAL | Status: AC
  Administered 2018-06-24: 450 mg via ORAL
  Filled 2018-06-24: qty 3

## 2018-06-24 NOTE — ED Triage Notes (Signed)
Pt brought in from [facility] via EMS for pink eye. Hx of dementia. Pt took entire course of medication for pink eye and facility sent out today for same.

## 2018-06-24 NOTE — ED Notes (Signed)
Unable to answer triage screening questions due to baseline mental status.

## 2018-06-24 NOTE — ED Notes (Signed)
Geralynn Rile, wellness director at [facility] given report.

## 2018-06-24 NOTE — ED Notes (Signed)
[facility]  CALL[facility]  FOR  TRANSPORT 

## 2018-06-24 NOTE — ED Notes (Signed)
Pt was up wandering around room. Pt exited bed from bottom as both side rails were still up. Pt assisted back into bed and covered up. Pt resting well at this time and denies needs. Will continue to monitor.

## 2018-06-24 NOTE — ED Provider Notes (Signed)
[facility] Emergency Department Provider Note  ____________________________________________   First MD Initiated Contact with Patient 06/24/18 [phone]     (approximate)  I have reviewed the triage vital signs and the nursing notes.   HISTORY  Chief Complaint Conjunctivitis  HPI Kristopher Evans is a 83 y.o. male with a history of dementia who is presenting with bilateral right eye.  He was recently on sulfacetamide drops which was stopped 4 days ago after he completed the course of treatment.  There was mild improvement but not complete resolution after the drops stopped the patient had recurrence of red eyes with bilateral discharge of exudates.  Patient without any complaints.  No fever reported.  History reviewed. No pertinent past medical history.  There are no active problems to display for this patient.     Prior to Admission medications   Medication Sig Start Date End Date Taking? Authorizing Provider  aspirin EC 81 MG tablet Take 81 mg by mouth daily.   Yes [provider]  Cholecalciferol (VITAMIN D) 2000 units CAPS Take 2,000 Units by mouth daily.   Yes [provider]  QUEtiapine (SEROQUEL) 25 MG tablet Take 25 mg by mouth every 8 (eight) hours as needed (for agitation).   Yes [provider]  vitamin B-12 (CYANOCOBALAMIN) 1000 MCG tablet Take 1,000 mcg by mouth daily.   Yes [provider]  Zinc Oxide (DESITIN) 13 % CREA Apply topically 2 (two) times daily as needed (for skin breakdown).   Yes [provider]    Allergies Patient has no known allergies.  No family history on file.  Social History Social History   Tobacco Use  . Smoking status: Not on file  Substance Use Topics  . Alcohol use: Not on file  . Drug use: Not on file    Review of Systems  Constitutional: No fever/chills Eyes: As above ENT: No sore throat. Cardiovascular: Denies chest pain. Respiratory: Denies shortness of  breath. Gastrointestinal: No abdominal pain.  No nausea, no vomiting.  No diarrhea.  No constipation. Genitourinary: Negative for dysuria. Musculoskeletal: Negative for back pain. Skin: Negative for rash. Neurological: Negative for headaches, focal weakness or numbness.   ____________________________________________   PHYSICAL EXAM:  VITAL SIGNS: ED Triage Vitals [06/24/18 0728]  Enc Vitals Group     BP (!) 169/77     Pulse Rate 64     Resp 18     Temp 97.6 F (36.4 C)     Temp Source Oral     SpO2 100 %     Weight      Height      Head Circumference      Peak Flow      Pain Score      Pain Loc      Pain Edu?      Excl. in GC?     Constitutional: Alert and oriented. Well appearing and in no acute distress. Eyes: Bilateral conjunctival erythema with mildly edematous conjunctive on the right side with periorbital swelling and edema on the right side as well.  Edema is mild to moderate.  Patient unable to follow commands to look from side to side.  No proptosis.  Bilateral exudate.  Bilateral corneas stained with fluorescein without any uptake, bilaterally. Head: Atraumatic. Nose: No congestion/rhinnorhea. Mouth/Throat: Mucous membranes are moist.  Neck: No stridor.   Cardiovascular: Normal rate, regular rhythm. Grossly normal heart sounds.   Respiratory: Normal respiratory effort.  No retractions. Lungs CTAB.  Gastrointestinal: Soft and nontender. No distention.  Musculoskeletal: No lower extremity tenderness nor edema.  No joint effusions. Neurologic:  Normal speech and language. No gross focal neurologic deficits are appreciated. Skin:  Skin is warm, dry and intact. No rash noted. Psychiatric: Mood and affect are normal. Speech and behavior are normal.  ____________________________________________   LABS (all labs ordered are listed, but only abnormal results are displayed)  Labs Reviewed  CBC WITH DIFFERENTIAL/PLATELET - Abnormal; Notable for the following  components:      Result Value   WBC 11.6 (*)    RBC 4.07 (*)    Hemoglobin 12.9 (*)    HCT 38.1 (*)    Lymphs Abs 4.8 (*)    All other components within normal limits  BASIC METABOLIC PANEL - Abnormal; Notable for the following components:   Calcium 8.2 (*)    All other components within normal limits   ____________________________________________  EKG   ____________________________________________  RADIOLOGY  No evidence of orbital cellulitis on the contrast CT. ____________________________________________   PROCEDURES  Procedure(s) performed:   Procedures  Critical Care performed:   ____________________________________________   INITIAL IMPRESSION / ASSESSMENT AND PLAN / ED COURSE  Pertinent labs & imaging results that were available during my care of the patient were reviewed by me and considered in my medical decision making (see chart for details).  DDX: Bilateral conjunctivitis, viral enteritis, bacterial contact Vitas, preseptal cellulitis, orbital cellulitis No previous files on the EMR for review.  ----------------------------------------- 10:43 AM on 06/24/2018 -----------------------------------------  Discussed the case with Dr. Inez Pilgrim of ophthalmology who recommends Vigamox drops for 7 days as well as erythromycin ointment for 2 weeks.  Patient will also be started on clindamycin for periorbital cellulitis.  Patient recommended for follow-up with outpatient ophthalmology.  Patient to be discharged back to his skilled nursing facility at this time.  Dr. Inez Pilgrim and I discussed the previous treatment with sulfacetamide. ____________________________________________   FINAL CLINICAL IMPRESSION(S) / ED DIAGNOSES  Bilateral conjunctivitis.  Right-sided periorbital cellulitis.  NEW MEDICATIONS STARTED DURING THIS VISIT:  New Prescriptions   No medications on file     Note:  This document was prepared using Dragon voice recognition software  and may include unintentional dictation errors.     Myrna Blazer, MD 06/24/18 1044

## 2018-06-30 DIAGNOSIS — F0280 Dementia in other diseases classified elsewhere without behavioral disturbance: Secondary | ICD-10-CM | POA: Diagnosis not present

## 2018-06-30 DIAGNOSIS — K4090 Unilateral inguinal hernia, without obstruction or gangrene, not specified as recurrent: Secondary | ICD-10-CM | POA: Diagnosis not present

## 2018-06-30 DIAGNOSIS — F419 Anxiety disorder, unspecified: Secondary | ICD-10-CM | POA: Diagnosis not present

## 2018-06-30 DIAGNOSIS — F1010 Alcohol abuse, uncomplicated: Secondary | ICD-10-CM | POA: Diagnosis not present

## 2018-06-30 DIAGNOSIS — G309 Alzheimer's disease, unspecified: Secondary | ICD-10-CM | POA: Diagnosis not present

## 2018-06-30 DIAGNOSIS — E782 Mixed hyperlipidemia: Secondary | ICD-10-CM | POA: Diagnosis not present

## 2018-06-30 DIAGNOSIS — I739 Peripheral vascular disease, unspecified: Secondary | ICD-10-CM | POA: Diagnosis not present

## 2018-06-30 DIAGNOSIS — L409 Psoriasis, unspecified: Secondary | ICD-10-CM | POA: Diagnosis not present

## 2018-06-30 DIAGNOSIS — I10 Essential (primary) hypertension: Secondary | ICD-10-CM | POA: Diagnosis not present

## 2018-07-02 ENCOUNTER — Encounter: Payer: Self-pay | Admitting: Emergency Medicine

## 2018-08-11 DIAGNOSIS — I739 Peripheral vascular disease, unspecified: Secondary | ICD-10-CM | POA: Diagnosis not present

## 2018-08-11 DIAGNOSIS — I10 Essential (primary) hypertension: Secondary | ICD-10-CM | POA: Diagnosis not present

## 2018-08-11 DIAGNOSIS — E785 Hyperlipidemia, unspecified: Secondary | ICD-10-CM | POA: Diagnosis not present

## 2018-08-11 DIAGNOSIS — Z79899 Other long term (current) drug therapy: Secondary | ICD-10-CM | POA: Diagnosis not present

## 2018-08-11 DIAGNOSIS — F0390 Unspecified dementia without behavioral disturbance: Secondary | ICD-10-CM | POA: Diagnosis not present

## 2018-08-18 DIAGNOSIS — Z79899 Other long term (current) drug therapy: Secondary | ICD-10-CM | POA: Diagnosis not present

## 2018-11-03 DIAGNOSIS — I10 Essential (primary) hypertension: Secondary | ICD-10-CM | POA: Diagnosis not present

## 2018-11-03 DIAGNOSIS — H1131 Conjunctival hemorrhage, right eye: Secondary | ICD-10-CM | POA: Diagnosis not present

## 2018-11-03 DIAGNOSIS — I739 Peripheral vascular disease, unspecified: Secondary | ICD-10-CM | POA: Diagnosis not present

## 2018-11-03 DIAGNOSIS — F0390 Unspecified dementia without behavioral disturbance: Secondary | ICD-10-CM | POA: Diagnosis not present

## 2018-11-03 DIAGNOSIS — Z79899 Other long term (current) drug therapy: Secondary | ICD-10-CM | POA: Diagnosis not present

## 2018-11-10 DIAGNOSIS — I739 Peripheral vascular disease, unspecified: Secondary | ICD-10-CM | POA: Diagnosis not present

## 2018-11-10 DIAGNOSIS — F0390 Unspecified dementia without behavioral disturbance: Secondary | ICD-10-CM | POA: Diagnosis not present

## 2018-11-10 DIAGNOSIS — L409 Psoriasis, unspecified: Secondary | ICD-10-CM | POA: Diagnosis not present

## 2018-11-10 DIAGNOSIS — Z79899 Other long term (current) drug therapy: Secondary | ICD-10-CM | POA: Diagnosis not present

## 2018-11-10 DIAGNOSIS — I10 Essential (primary) hypertension: Secondary | ICD-10-CM | POA: Diagnosis not present

## 2018-12-10 DIAGNOSIS — F0391 Unspecified dementia with behavioral disturbance: Secondary | ICD-10-CM | POA: Diagnosis not present

## 2018-12-10 DIAGNOSIS — I10 Essential (primary) hypertension: Secondary | ICD-10-CM | POA: Diagnosis not present

## 2018-12-10 DIAGNOSIS — E559 Vitamin D deficiency, unspecified: Secondary | ICD-10-CM | POA: Diagnosis not present

## 2018-12-10 DIAGNOSIS — D519 Vitamin B12 deficiency anemia, unspecified: Secondary | ICD-10-CM | POA: Diagnosis not present

## 2018-12-10 DIAGNOSIS — I739 Peripheral vascular disease, unspecified: Secondary | ICD-10-CM | POA: Diagnosis not present

## 2018-12-10 DIAGNOSIS — E785 Hyperlipidemia, unspecified: Secondary | ICD-10-CM | POA: Diagnosis not present

## 2018-12-17 DIAGNOSIS — R1909 Other intra-abdominal and pelvic swelling, mass and lump: Secondary | ICD-10-CM | POA: Diagnosis not present

## 2018-12-17 DIAGNOSIS — F0391 Unspecified dementia with behavioral disturbance: Secondary | ICD-10-CM | POA: Diagnosis not present

## 2018-12-17 DIAGNOSIS — L408 Other psoriasis: Secondary | ICD-10-CM | POA: Diagnosis not present

## 2018-12-17 DIAGNOSIS — N492 Inflammatory disorders of scrotum: Secondary | ICD-10-CM | POA: Diagnosis not present

## 2018-12-31 DIAGNOSIS — H01009 Unspecified blepharitis unspecified eye, unspecified eyelid: Secondary | ICD-10-CM | POA: Diagnosis not present

## 2018-12-31 DIAGNOSIS — F0391 Unspecified dementia with behavioral disturbance: Secondary | ICD-10-CM | POA: Diagnosis not present

## 2018-12-31 DIAGNOSIS — H1033 Unspecified acute conjunctivitis, bilateral: Secondary | ICD-10-CM | POA: Diagnosis not present

## 2019-02-04 DIAGNOSIS — G4709 Other insomnia: Secondary | ICD-10-CM | POA: Diagnosis not present

## 2019-02-04 DIAGNOSIS — F0281 Dementia in other diseases classified elsewhere with behavioral disturbance: Secondary | ICD-10-CM | POA: Diagnosis not present

## 2019-04-08 DIAGNOSIS — L4050 Arthropathic psoriasis, unspecified: Secondary | ICD-10-CM | POA: Diagnosis not present

## 2019-04-08 DIAGNOSIS — E785 Hyperlipidemia, unspecified: Secondary | ICD-10-CM | POA: Diagnosis not present

## 2019-04-08 DIAGNOSIS — E559 Vitamin D deficiency, unspecified: Secondary | ICD-10-CM | POA: Diagnosis not present

## 2019-04-08 DIAGNOSIS — G4700 Insomnia, unspecified: Secondary | ICD-10-CM | POA: Diagnosis not present

## 2019-04-08 DIAGNOSIS — F0391 Unspecified dementia with behavioral disturbance: Secondary | ICD-10-CM | POA: Diagnosis not present

## 2019-04-08 DIAGNOSIS — D519 Vitamin B12 deficiency anemia, unspecified: Secondary | ICD-10-CM | POA: Diagnosis not present

## 2019-04-08 DIAGNOSIS — I10 Essential (primary) hypertension: Secondary | ICD-10-CM | POA: Diagnosis not present

## 2019-04-08 DIAGNOSIS — I739 Peripheral vascular disease, unspecified: Secondary | ICD-10-CM | POA: Diagnosis not present

## 2019-04-08 DIAGNOSIS — L03012 Cellulitis of left finger: Secondary | ICD-10-CM | POA: Diagnosis not present

## 2019-04-15 DIAGNOSIS — D519 Vitamin B12 deficiency anemia, unspecified: Secondary | ICD-10-CM | POA: Diagnosis not present

## 2019-04-15 DIAGNOSIS — I10 Essential (primary) hypertension: Secondary | ICD-10-CM | POA: Diagnosis not present

## 2019-04-15 DIAGNOSIS — L4050 Arthropathic psoriasis, unspecified: Secondary | ICD-10-CM | POA: Diagnosis not present

## 2019-04-15 DIAGNOSIS — L03012 Cellulitis of left finger: Secondary | ICD-10-CM | POA: Diagnosis not present

## 2019-04-15 DIAGNOSIS — E785 Hyperlipidemia, unspecified: Secondary | ICD-10-CM | POA: Diagnosis not present

## 2019-04-15 DIAGNOSIS — F0391 Unspecified dementia with behavioral disturbance: Secondary | ICD-10-CM | POA: Diagnosis not present

## 2019-04-15 DIAGNOSIS — I739 Peripheral vascular disease, unspecified: Secondary | ICD-10-CM | POA: Diagnosis not present

## 2019-04-15 DIAGNOSIS — G4709 Other insomnia: Secondary | ICD-10-CM | POA: Diagnosis not present

## 2019-04-15 DIAGNOSIS — E559 Vitamin D deficiency, unspecified: Secondary | ICD-10-CM | POA: Diagnosis not present

## 2019-04-16 DIAGNOSIS — E785 Hyperlipidemia, unspecified: Secondary | ICD-10-CM | POA: Diagnosis not present

## 2019-04-16 DIAGNOSIS — I10 Essential (primary) hypertension: Secondary | ICD-10-CM | POA: Diagnosis not present

## 2019-04-16 DIAGNOSIS — E559 Vitamin D deficiency, unspecified: Secondary | ICD-10-CM | POA: Diagnosis not present

## 2019-04-16 DIAGNOSIS — D519 Vitamin B12 deficiency anemia, unspecified: Secondary | ICD-10-CM | POA: Diagnosis not present

## 2019-06-03 DIAGNOSIS — I739 Peripheral vascular disease, unspecified: Secondary | ICD-10-CM | POA: Diagnosis not present

## 2019-06-03 DIAGNOSIS — F0391 Unspecified dementia with behavioral disturbance: Secondary | ICD-10-CM | POA: Diagnosis not present

## 2019-06-17 DIAGNOSIS — I10 Essential (primary) hypertension: Secondary | ICD-10-CM | POA: Diagnosis not present

## 2019-06-17 DIAGNOSIS — E785 Hyperlipidemia, unspecified: Secondary | ICD-10-CM | POA: Diagnosis not present

## 2019-06-17 DIAGNOSIS — G4709 Other insomnia: Secondary | ICD-10-CM | POA: Diagnosis not present

## 2019-06-17 DIAGNOSIS — F0391 Unspecified dementia with behavioral disturbance: Secondary | ICD-10-CM | POA: Diagnosis not present

## 2019-06-17 DIAGNOSIS — L03012 Cellulitis of left finger: Secondary | ICD-10-CM | POA: Diagnosis not present

## 2019-06-17 DIAGNOSIS — I739 Peripheral vascular disease, unspecified: Secondary | ICD-10-CM | POA: Diagnosis not present

## 2019-06-17 DIAGNOSIS — L408 Other psoriasis: Secondary | ICD-10-CM | POA: Diagnosis not present

## 2019-06-17 DIAGNOSIS — D519 Vitamin B12 deficiency anemia, unspecified: Secondary | ICD-10-CM | POA: Diagnosis not present

## 2019-06-17 DIAGNOSIS — E559 Vitamin D deficiency, unspecified: Secondary | ICD-10-CM | POA: Diagnosis not present

## 2019-07-16 DIAGNOSIS — B351 Tinea unguium: Secondary | ICD-10-CM | POA: Diagnosis not present

## 2019-07-16 DIAGNOSIS — I739 Peripheral vascular disease, unspecified: Secondary | ICD-10-CM | POA: Diagnosis not present

## 2019-07-16 DIAGNOSIS — Q845 Enlarged and hypertrophic nails: Secondary | ICD-10-CM | POA: Diagnosis not present

## 2019-07-16 DIAGNOSIS — L603 Nail dystrophy: Secondary | ICD-10-CM | POA: Diagnosis not present

## 2019-09-02 ENCOUNTER — Encounter (HOSPITAL_COMMUNITY): Payer: Self-pay | Admitting: Emergency Medicine

## 2019-09-02 ENCOUNTER — Emergency Department (HOSPITAL_COMMUNITY): Payer: PPO

## 2019-09-02 ENCOUNTER — Other Ambulatory Visit: Payer: Self-pay

## 2019-09-02 ENCOUNTER — Inpatient Hospital Stay (HOSPITAL_COMMUNITY)
Admission: EM | Admit: 2019-09-02 | Discharge: 2019-09-15 | DRG: 579 | Disposition: A | Discharge status: Skilled Nursing Facility | Payer: PPO | Source: Skilled Nursing Facility | Attending: Family Medicine | Admitting: Family Medicine

## 2019-09-02 DIAGNOSIS — Z7982 Long term (current) use of aspirin: Secondary | ICD-10-CM

## 2019-09-02 DIAGNOSIS — J984 Other disorders of lung: Secondary | ICD-10-CM | POA: Diagnosis not present

## 2019-09-02 DIAGNOSIS — D649 Anemia, unspecified: Secondary | ICD-10-CM | POA: Diagnosis present

## 2019-09-02 DIAGNOSIS — R627 Adult failure to thrive: Secondary | ICD-10-CM | POA: Diagnosis not present

## 2019-09-02 DIAGNOSIS — J0410 Acute tracheitis without obstruction: Secondary | ICD-10-CM | POA: Diagnosis not present

## 2019-09-02 DIAGNOSIS — B354 Tinea corporis: Secondary | ICD-10-CM | POA: Diagnosis not present

## 2019-09-02 DIAGNOSIS — D72829 Elevated white blood cell count, unspecified: Secondary | ICD-10-CM | POA: Diagnosis not present

## 2019-09-02 DIAGNOSIS — R0989 Other specified symptoms and signs involving the circulatory and respiratory systems: Secondary | ICD-10-CM

## 2019-09-02 DIAGNOSIS — M6281 Muscle weakness (generalized): Secondary | ICD-10-CM | POA: Diagnosis not present

## 2019-09-02 DIAGNOSIS — F0391 Unspecified dementia with behavioral disturbance: Secondary | ICD-10-CM | POA: Diagnosis not present

## 2019-09-02 DIAGNOSIS — F0390 Unspecified dementia without behavioral disturbance: Secondary | ICD-10-CM | POA: Diagnosis present

## 2019-09-02 DIAGNOSIS — Z6820 Body mass index (BMI) 20.0-20.9, adult: Secondary | ICD-10-CM

## 2019-09-02 DIAGNOSIS — G4700 Insomnia, unspecified: Secondary | ICD-10-CM | POA: Diagnosis not present

## 2019-09-02 DIAGNOSIS — J9601 Acute respiratory failure with hypoxia: Secondary | ICD-10-CM | POA: Diagnosis not present

## 2019-09-02 DIAGNOSIS — L0211 Cutaneous abscess of neck: Secondary | ICD-10-CM | POA: Diagnosis not present

## 2019-09-02 DIAGNOSIS — I771 Stricture of artery: Secondary | ICD-10-CM | POA: Diagnosis not present

## 2019-09-02 DIAGNOSIS — R739 Hyperglycemia, unspecified: Secondary | ICD-10-CM | POA: Diagnosis not present

## 2019-09-02 DIAGNOSIS — B9562 Methicillin resistant Staphylococcus aureus infection as the cause of diseases classified elsewhere: Secondary | ICD-10-CM | POA: Diagnosis not present

## 2019-09-02 DIAGNOSIS — E559 Vitamin D deficiency, unspecified: Secondary | ICD-10-CM | POA: Diagnosis not present

## 2019-09-02 DIAGNOSIS — E785 Hyperlipidemia, unspecified: Secondary | ICD-10-CM | POA: Diagnosis not present

## 2019-09-02 DIAGNOSIS — K760 Fatty (change of) liver, not elsewhere classified: Secondary | ICD-10-CM | POA: Diagnosis not present

## 2019-09-02 DIAGNOSIS — I6523 Occlusion and stenosis of bilateral carotid arteries: Secondary | ICD-10-CM | POA: Diagnosis not present

## 2019-09-02 DIAGNOSIS — L0291 Cutaneous abscess, unspecified: Secondary | ICD-10-CM | POA: Diagnosis not present

## 2019-09-02 DIAGNOSIS — L03221 Cellulitis of neck: Secondary | ICD-10-CM | POA: Diagnosis not present

## 2019-09-02 DIAGNOSIS — Z20828 Contact with and (suspected) exposure to other viral communicable diseases: Secondary | ICD-10-CM | POA: Diagnosis not present

## 2019-09-02 DIAGNOSIS — L308 Other specified dermatitis: Secondary | ICD-10-CM | POA: Diagnosis not present

## 2019-09-02 DIAGNOSIS — I739 Peripheral vascular disease, unspecified: Secondary | ICD-10-CM | POA: Diagnosis not present

## 2019-09-02 DIAGNOSIS — L89311 Pressure ulcer of right buttock, stage 1: Secondary | ICD-10-CM | POA: Diagnosis not present

## 2019-09-02 DIAGNOSIS — K4090 Unilateral inguinal hernia, without obstruction or gangrene, not specified as recurrent: Secondary | ICD-10-CM | POA: Diagnosis not present

## 2019-09-02 DIAGNOSIS — R5381 Other malaise: Secondary | ICD-10-CM | POA: Diagnosis not present

## 2019-09-02 DIAGNOSIS — L8990 Pressure ulcer of unspecified site, unspecified stage: Secondary | ICD-10-CM | POA: Insufficient documentation

## 2019-09-02 DIAGNOSIS — Z880 Allergy status to penicillin: Secondary | ICD-10-CM | POA: Diagnosis not present

## 2019-09-02 DIAGNOSIS — I700 Atherosclerosis of aorta: Secondary | ICD-10-CM | POA: Diagnosis present

## 2019-09-02 DIAGNOSIS — J690 Pneumonitis due to inhalation of food and vomit: Secondary | ICD-10-CM | POA: Diagnosis not present

## 2019-09-02 DIAGNOSIS — Z03818 Encounter for observation for suspected exposure to other biological agents ruled out: Secondary | ICD-10-CM | POA: Diagnosis not present

## 2019-09-02 DIAGNOSIS — A4902 Methicillin resistant Staphylococcus aureus infection, unspecified site: Secondary | ICD-10-CM | POA: Diagnosis not present

## 2019-09-02 DIAGNOSIS — D519 Vitamin B12 deficiency anemia, unspecified: Secondary | ICD-10-CM | POA: Diagnosis not present

## 2019-09-02 DIAGNOSIS — E871 Hypo-osmolality and hyponatremia: Secondary | ICD-10-CM | POA: Diagnosis present

## 2019-09-02 DIAGNOSIS — E876 Hypokalemia: Secondary | ICD-10-CM | POA: Diagnosis not present

## 2019-09-02 DIAGNOSIS — R1310 Dysphagia, unspecified: Secondary | ICD-10-CM | POA: Diagnosis not present

## 2019-09-02 DIAGNOSIS — F0150 Vascular dementia without behavioral disturbance: Secondary | ICD-10-CM | POA: Diagnosis not present

## 2019-09-02 DIAGNOSIS — R404 Transient alteration of awareness: Secondary | ICD-10-CM | POA: Diagnosis not present

## 2019-09-02 DIAGNOSIS — I10 Essential (primary) hypertension: Secondary | ICD-10-CM | POA: Diagnosis present

## 2019-09-02 DIAGNOSIS — R7989 Other specified abnormal findings of blood chemistry: Secondary | ICD-10-CM

## 2019-09-02 DIAGNOSIS — L98499 Non-pressure chronic ulcer of skin of other sites with unspecified severity: Secondary | ICD-10-CM | POA: Diagnosis not present

## 2019-09-02 DIAGNOSIS — M2550 Pain in unspecified joint: Secondary | ICD-10-CM | POA: Diagnosis not present

## 2019-09-02 DIAGNOSIS — Z7401 Bed confinement status: Secondary | ICD-10-CM | POA: Diagnosis not present

## 2019-09-02 DIAGNOSIS — I159 Secondary hypertension, unspecified: Secondary | ICD-10-CM | POA: Diagnosis not present

## 2019-09-02 DIAGNOSIS — Z66 Do not resuscitate: Secondary | ICD-10-CM | POA: Diagnosis not present

## 2019-09-02 DIAGNOSIS — R0602 Shortness of breath: Secondary | ICD-10-CM

## 2019-09-02 DIAGNOSIS — R1312 Dysphagia, oropharyngeal phase: Secondary | ICD-10-CM | POA: Diagnosis not present

## 2019-09-02 LAB — BASIC METABOLIC PANEL
Anion gap: 12 (ref 5–15)
BUN: 20 mg/dL (ref 8–23)
CO2: 25 mmol/L (ref 22–32)
Calcium: 8.7 mg/dL — ABNORMAL LOW (ref 8.9–10.3)
Chloride: 103 mmol/L (ref 98–111)
Creatinine, Ser: 1 mg/dL (ref 0.61–1.24)
GFR calc Af Amer: >60 mL/min (ref >60)
GFR calc non Af Amer: >60 mL/min (ref >60)
Glucose, Bld: 106 mg/dL — ABNORMAL HIGH (ref 70–99)
Potassium: 3.9 mmol/L (ref 3.5–5.1)
Sodium: 140 mmol/L (ref 135–145)

## 2019-09-02 LAB — CBC WITH DIFFERENTIAL/PLATELET
Abs Immature Granulocytes: 0.09 10*3/uL — ABNORMAL HIGH (ref 0.00–0.07)
Basophils Absolute: 0.1 10*3/uL (ref 0.0–0.1)
Basophils Relative: 0 %
Eosinophils Absolute: 0.2 10*3/uL (ref 0.0–0.5)
Eosinophils Relative: 1 %
HCT: 44 % (ref 39.0–52.0)
Hemoglobin: 13.7 g/dL (ref 13.0–17.0)
Immature Granulocytes: 1 %
Lymphocytes Relative: 25 %
Lymphs Abs: 4.7 10*3/uL — ABNORMAL HIGH (ref 0.7–4.0)
MCH: 30.6 pg (ref 26.0–34.0)
MCHC: 31.1 g/dL (ref 30.0–36.0)
MCV: 98.2 fL (ref 80.0–100.0)
Monocytes Absolute: 1.4 10*3/uL — ABNORMAL HIGH (ref 0.1–1.0)
Monocytes Relative: 8 %
Neutro Abs: 12.2 10*3/uL — ABNORMAL HIGH (ref 1.7–7.7)
Neutrophils Relative %: 65 %
Platelets: 319 10*3/uL (ref 150–400)
RBC: 4.48 MIL/uL (ref 4.22–5.81)
RDW: 14.5 % (ref 11.5–15.5)
WBC: 18.7 10*3/uL — ABNORMAL HIGH (ref 4.0–10.5)
nRBC: 0 % (ref 0.0–0.2)

## 2019-09-02 MED ORDER — HALOPERIDOL LACTATE 5 MG/ML IJ SOLN
INTRAMUSCULAR | Status: AC
  Filled 2019-09-02: qty 1

## 2019-09-02 MED ORDER — VITAMIN B-12 1000 MCG PO TABS
1000.0000 ug | ORAL_TABLET | Freq: Every day | ORAL | Status: DC
  Administered 2019-09-03 – 2019-09-14 (×10): 1000 ug via ORAL
  Filled 2019-09-02 (×10): qty 1

## 2019-09-02 MED ORDER — ONDANSETRON HCL 4 MG/2ML IJ SOLN: 4.0000 mg | Freq: Four times a day (QID) | INTRAMUSCULAR | Status: DC | PRN

## 2019-09-02 MED ORDER — ACETAMINOPHEN 325 MG PO TABS
650.0000 mg | ORAL_TABLET | Freq: Four times a day (QID) | ORAL | Status: DC | PRN
  Administered 2019-09-04 – 2019-09-05 (×2): 650 mg via ORAL
  Filled 2019-09-02 (×3): qty 2

## 2019-09-02 MED ORDER — ZINC OXIDE 40 % EX OINT
1.0000 "application " | TOPICAL_OINTMENT | CUTANEOUS | Status: DC | PRN
  Filled 2019-09-02: qty 57

## 2019-09-02 MED ORDER — SODIUM CHLORIDE 0.9 % IV SOLN
INTRAVENOUS | Status: DC
  Administered 2019-09-02 – 2019-09-05 (×3): via INTRAVENOUS

## 2019-09-02 MED ORDER — TRAZODONE HCL 50 MG PO TABS
50.0000 mg | ORAL_TABLET | Freq: Every day | ORAL | Status: DC
  Administered 2019-09-02 – 2019-09-06 (×5): 50 mg via ORAL
  Filled 2019-09-02 (×5): qty 1

## 2019-09-02 MED ORDER — VITAMIN D 25 MCG (1000 UNIT) PO TABS
2000.0000 [IU] | ORAL_TABLET | Freq: Every day | ORAL | Status: DC
  Administered 2019-09-03 – 2019-09-14 (×10): 2000 [IU] via ORAL
  Filled 2019-09-02 (×11): qty 2

## 2019-09-02 MED ORDER — ONDANSETRON HCL 4 MG PO TABS: 4.0000 mg | ORAL_TABLET | Freq: Four times a day (QID) | ORAL | Status: DC | PRN

## 2019-09-02 MED ORDER — SODIUM CHLORIDE 0.9 % IV SOLN
2.0000 g | Freq: Once | INTRAVENOUS | Status: AC
  Administered 2019-09-03: 2 g via INTRAVENOUS
  Filled 2019-09-02: qty 2

## 2019-09-02 MED ORDER — IOHEXOL 300 MG/ML  SOLN
75.0000 mL | Freq: Once | INTRAMUSCULAR | Status: AC | PRN
  Administered 2019-09-02: 75 mL via INTRAVENOUS

## 2019-09-02 MED ORDER — ENOXAPARIN SODIUM 40 MG/0.4ML ~~LOC~~ SOLN
40.0000 mg | Freq: Every day | SUBCUTANEOUS | Status: DC
  Administered 2019-09-02 – 2019-09-14 (×13): 40 mg via SUBCUTANEOUS
  Filled 2019-09-02 (×13): qty 0.4

## 2019-09-02 MED ORDER — ASPIRIN EC 81 MG PO TBEC
81.0000 mg | DELAYED_RELEASE_TABLET | Freq: Every day | ORAL | Status: DC
  Administered 2019-09-03 – 2019-09-14 (×10): 81 mg via ORAL
  Filled 2019-09-02 (×10): qty 1

## 2019-09-02 MED ORDER — TRAMADOL HCL 50 MG PO TABS
50.0000 mg | ORAL_TABLET | Freq: Four times a day (QID) | ORAL | Status: DC | PRN
  Administered 2019-09-04 – 2019-09-13 (×9): 50 mg via ORAL
  Filled 2019-09-02 (×9): qty 1

## 2019-09-02 MED ORDER — HALOPERIDOL LACTATE 5 MG/ML IJ SOLN
2.5000 mg | Freq: Once | INTRAMUSCULAR | Status: AC
  Administered 2019-09-02: 2.5 mg via INTRAVENOUS

## 2019-09-02 MED ORDER — ACETAMINOPHEN 650 MG RE SUPP: 650.0000 mg | Freq: Four times a day (QID) | RECTAL | Status: DC | PRN

## 2019-09-02 MED ORDER — VANCOMYCIN HCL IN DEXTROSE 1-5 GM/200ML-% IV SOLN
1000.0000 mg | Freq: Once | INTRAVENOUS | Status: AC
  Administered 2019-09-02: 1000 mg via INTRAVENOUS
  Filled 2019-09-02: qty 200

## 2019-09-02 MED ORDER — SODIUM CHLORIDE (PF) 0.9 % IJ SOLN
INTRAMUSCULAR | Status: AC
  Administered 2019-09-02: 19:00:00
  Filled 2019-09-02: qty 50

## 2019-09-02 MED ORDER — LIDOCAINE HCL (PF) 1 % IJ SOLN
5.0000 mL | Freq: Once | INTRAMUSCULAR | Status: AC
  Administered 2019-09-02: 17:00:00 5 mL via INTRADERMAL
  Filled 2019-09-02: qty 30

## 2019-09-02 NOTE — ED Provider Notes (Signed)
[facility]-EMERGENCY DEPT Provider Note   CSN: [id number] Arrival date & time: 09/02/19  1458     History   Chief Complaint Chief Complaint  Patient presents with  . Abscess    HPI Kristopher Evans is a 84 y.o. male.  Presents emerged department with chief complaint of left neck abscess.  Time of my interview, patient has no acute complaints.  Patient is unable to provide history due to his dementia.  Facility reported that they noticed an abscess near his left ear for the past 3 days and started draining today.  Level 5 history caveat limited due to patient's dementia.     HPI  Past Medical History:  Diagnosis Date  . Dementia (HCC)     There are no active problems to display for this patient.   History reviewed. No pertinent surgical history.      Home Medications    Prior to Admission medications   Medication Sig Start Date End Date Taking? Authorizing Provider  aspirin EC 81 MG tablet Take 81 mg by mouth daily.   Yes [provider]  Cholecalciferol (VITAMIN D) 2000 units CAPS Take 2,000 Units by mouth daily.   Yes [provider]  liver oil-zinc oxide (DESITIN) 40 % ointment Apply 1 application topically as needed for irritation.   Yes [provider]  traZODone (DESYREL) 50 MG tablet Take 50 mg by mouth at bedtime.   Yes [provider]  triamcinolone cream (KENALOG) 0.1 % Apply 1 application topically 3 (three) times daily.   Yes [provider]  vitamin B-12 (CYANOCOBALAMIN) 1000 MCG tablet Take 1,000 mcg by mouth daily.   Yes [provider]  moxifloxacin (VIGAMOX) 0.5 % ophthalmic solution 1gtt to eyes, bilaterally, q2hours while awake for 2 days, THEN q6hrs for 5 days. Patient not taking: Reported on 09/02/2019 06/24/18   Myrna Blazer, MD    Family History No family history on file.  Social History Social History   Tobacco Use  . Smoking status: Never Smoker   Substance Use Topics  . Alcohol use: Yes  . Drug use: Not on file     Allergies   Penicillins   Review of Systems Review of Systems  Unable to perform ROS: Dementia  Musculoskeletal: Positive for neck pain.     Physical Exam Updated Vital Signs BP (!) 162/81   Pulse 79   Temp 98.2 F (36.8 C) (Oral)   Resp 16   SpO2 100%   Physical Exam Vitals signs and nursing note reviewed.  Constitutional:      Appearance: He is well-developed.  HENT:     Head: Normocephalic and atraumatic.  Eyes:     Conjunctiva/sclera: Conjunctivae normal.  Neck:     Musculoskeletal: Neck supple.     Comments: 4 cm diameter area of induration over left lateral neck, just inferior to left ear, small purulent active drainage noted, mild overlying erythema, ear unaffected, no swelling or tenderness over mastoid process Cardiovascular:     Rate and Rhythm: Normal rate and regular rhythm.     Heart sounds: No murmur.  Pulmonary:     Effort: Pulmonary effort is normal. No respiratory distress.     Breath sounds: Normal breath sounds.  Abdominal:     Palpations: Abdomen is soft.     Tenderness: There is no abdominal tenderness.  Musculoskeletal: Normal range of motion.        General: No swelling or tenderness.  Skin:  General: Skin is warm and dry.  Neurological:     Mental Status: He is alert.      ED Treatments / Results  Labs (all labs ordered are listed, but only abnormal results are displayed) Labs Reviewed  CBC WITH DIFFERENTIAL/PLATELET  BASIC METABOLIC PANEL    EKG None  Radiology No results found.  Procedures .Marland KitchenIncision and Drainage  Date/Time: 09/02/2019 11:30 PM Performed by: Lucrezia Starch, MD Authorized by: Lucrezia Starch, MD   Consent:    Consent obtained:  Verbal   Consent given by:  Patient   Alternatives discussed:  No treatment Location:    Type:  Abscess   Size:  3   Location:  Neck   Neck location: left lateral. Pre-procedure details:     Skin preparation:  Betadine Anesthesia (see MAR for exact dosages):    Anesthesia method:  Local infiltration   Local anesthetic:  Lidocaine 1% w/o epi Procedure type:    Complexity:  Simple Procedure details:    Incision types:  Single straight   Incision depth:  Dermal   Scalpel blade:  11   Drainage:  Purulent   Drainage amount:  Moderate   Wound treatment:  Wound left open   Packing materials:  None Post-procedure details:    Patient tolerance of procedure:  Tolerated well, no immediate complications   (including critical care time)  Medications Ordered in ED Medications  lidocaine (PF) (XYLOCAINE) 1 % injection 5 mL (5 mLs Intradermal Given by Other 09/02/19 1642)     Initial Impression / Assessment and Plan / ED Course  I have reviewed the triage vital signs and the nursing notes.  Pertinent labs & imaging results that were available during my care of the patient were reviewed by me and considered in my medical decision making (see chart for details).  Clinical Course as of Sep 02 2327  Wed Sep 02, 2019  1655 Complete initial assessment, fairly impressive left lateral neck abscess, will check labs and CT scan to evaluate for deeper soft tissue infection, otherwise will plan bedside I&D   [RD]  2032 Attempted bedside I and D, small purulence obtained, but do not feel I was able to drain entirety of abscess, will discuss with ENT and likely admit hospitalist, will start abx and obtain cultures   [RD]    Clinical Course User Index [RD] Lucrezia Starch, MD       84 year old male presents to ER with left neck abscess.  Noted overlying cellulitis.  Labs concerning for leukocytosis.  CT scan demonstrated 3 cm diameter abscess, no deeper tissue involvement.  Attempted bedside I&D, had very modest purulent drainage.  Given did not have significant drainage and patient's overlying cellulitis, discussed case with ENT.  Recommends trial of IV antibiotics, hospitalist  admission.  If not improving, they can be consulted to evaluate for formal I&D.  Discussed case with Dr. Jonelle Sidle who will admit patient.  Started vanco.  Final Clinical Impressions(s) / ED Diagnoses   Final diagnoses:  Neck abscess  Cellulitis of neck    ED Discharge Orders    None       Lucrezia Starch, MD 09/02/19 [phone]

## 2019-09-02 NOTE — ED Notes (Signed)
Patient transported to CT 

## 2019-09-02 NOTE — ED Notes (Signed)
When pt rolled for EDP to attempt draining the abscess, it was noted the pt had urinated on himself. When cleaning the pt discovered pt had a BM.  Pt and bedding were cleaned with assistance of Joy, EMT.  Condom cath was placed on the pt, as well as a brief.

## 2019-09-02 NOTE — ED Notes (Signed)
ED Provider at bedside. 

## 2019-09-02 NOTE — ED Triage Notes (Signed)
Per EMS, patient from [facility], c/o abscess behind left ear x3 days and started draining today. A&Ox1 to baseline. Hx dementia. Ambulatory.

## 2019-09-02 NOTE — ED Notes (Signed)
Pt returned from CT °

## 2019-09-02 NOTE — ED Notes (Signed)
ED TO INPATIENT HANDOFF REPORT  Name/Age/Gender Kristopher Evans 84 y.o. male  Code Status Advance Directive Documentation     Most Recent Value  Type of Advance Directive  Out of facility DNR (pink MOST or yellow form)  Pre-existing out of facility DNR order (yellow form or pink MOST form)  -  "MOST" Form in Place?  -      Home/SNF/Other SNF  Chief Complaint Abscess on neck  Level of Care/Admitting Diagnosis ED Disposition    ED Disposition Condition Comment   Admit  Hospital Area: [facility] [[id number]]  Level of Care: Med-Surg [16]  Covid Evaluation: Asymptomatic Screening Protocol (No Symptoms)  Diagnosis: Neck abscess [027253]  Admitting Physician: Rometta Emery [[id number]]  Attending Physician: Rometta Emery [[id number]]  Estimated length of stay: past midnight tomorrow  Certification:: I certify this patient will need inpatient services for at least 2 midnights  PT Class (Do Not Modify): Inpatient [101]  PT Acc Code (Do Not Modify): Private [1]       Medical History Past Medical History:  Diagnosis Date  . Dementia (HCC)     Allergies Allergies  Allergen Reactions  . Penicillins     Not listed on MAR    IV Location/Drains/Wounds Patient Lines/Drains/Airways Status   Active Line/Drains/Airways    Name:   Placement date:   Placement time:   Site:   Days:   Peripheral IV 09/02/19 Right Antecubital   09/02/19    1641    Antecubital   less than 1          Labs/Imaging Results for orders placed or performed during the hospital encounter of 09/02/19 (from the past 48 hour(s))  CBC with Differential     Status: Abnormal   Collection Time: 09/02/19  4:42 PM  Result Value Ref Range   WBC 18.7 (H) 4.0 - 10.5 K/uL   RBC 4.48 4.22 - 5.81 MIL/uL   Hemoglobin 13.7 13.0 - 17.0 g/dL   HCT 66.4 40.3 - 47.4 %   MCV 98.2 80.0 - 100.0 fL   MCH 30.6 26.0 - 34.0 pg   MCHC 31.1 30.0 - 36.0 g/dL   RDW 25.9 56.3 - 87.5 %   Platelets 319 150 - 400  K/uL   nRBC 0.0 0.0 - 0.2 %   Neutrophils Relative % 65 %   Neutro Abs 12.2 (H) 1.7 - 7.7 K/uL   Lymphocytes Relative 25 %   Lymphs Abs 4.7 (H) 0.7 - 4.0 K/uL   Monocytes Relative 8 %   Monocytes Absolute 1.4 (H) 0.1 - 1.0 K/uL   Eosinophils Relative 1 %   Eosinophils Absolute 0.2 0.0 - 0.5 K/uL   Basophils Relative 0 %   Basophils Absolute 0.1 0.0 - 0.1 K/uL   Immature Granulocytes 1 %   Abs Immature Granulocytes 0.09 (H) 0.00 - 0.07 K/uL    Comment: Performed at [facility], [address] [place], [place] [address]  Basic metabolic panel     Status: Abnormal   Collection Time: 09/02/19  4:42 PM  Result Value Ref Range   Sodium 140 135 - 145 mmol/L   Potassium 3.9 3.5 - 5.1 mmol/L   Chloride 103 98 - 111 mmol/L   CO2 25 22 - 32 mmol/L   Glucose, Bld 106 (H) 70 - 99 mg/dL   BUN 20 8 - 23 mg/dL   Creatinine, Ser 9.51 0.61 - 1.24 mg/dL   Calcium 8.7 (L) 8.9 - 10.3  mg/dL   GFR calc non Af Amer >60 >60 mL/min   GFR calc Af Amer >60 >60 mL/min   Anion gap 12 5 - 15    Comment: Performed at [facility] [address], [place], [place] [address]   Ct Soft Tissue Neck W Contrast  Result Date: 09/02/2019 CLINICAL DATA:  Abscess behind the left ear with drainage. Left lateral neck swelling. EXAM: CT NECK WITH CONTRAST TECHNIQUE: Multidetector CT imaging of the neck was performed using the standard protocol following the bolus administration of intravenous contrast. CONTRAST:  51mL OMNIPAQUE IOHEXOL 300 MG/ML  SOLN COMPARISON:  None. FINDINGS: Pharynx and larynx: No evidence of mass or swelling within limitations of motion artifact. Patent airway. No retropharyngeal fluid collection. Salivary glands: No inflammation, mass, or stone. Thyroid: Unremarkable. Lymph nodes: Mildly enlarged left supraclavicular and level V lymph nodes measuring 9-11 mm in short axis, likely reactive. Vascular: Aortic and mild carotid artery atherosclerosis. Limited  intracranial: Unremarkable. Visualized orbits: Bilateral cataract extraction. Mastoids and visualized paranasal sinuses: Clear. Skeleton: Advanced disc space narrowing at C5-6, C6-7, and C7-T1. Severe neural foraminal stenosis due to uncovertebral spurring on the right at C5-6 and on the left at C6-7. Upper chest: Clear lung apices. Other: 2.5 x 2.7 x 3.0 cm rim enhancing fluid collection in the left posterolateral neck centered in the subcutaneous/cutaneous tissues with overlying skin bulging, skin thickening, and subcutaneous inflammation. Inflammation extends into the underlying left posterior neck musculature without evidence of a separate deep cervical abscess. IMPRESSION: 1. 3 cm left neck abscess and surrounding cellulitis. 2. Mildly enlarged left neck lymph nodes, likely reactive. 3. Aortic Atherosclerosis (ICD10-I70.0). Electronically Signed   By: Logan Bores M.D.   On: 09/02/2019 19:22    Pending Labs Unresulted Labs (From admission, onward)    Start     Ordered   09/02/19 2039  SARS CORONAVIRUS 2 (TAT 6-24 HRS) Nasopharyngeal Nasopharyngeal Swab  (Asymptomatic/Tier 2 Patients Labs)  Once,   STAT    Question Answer Comment  Is this test for diagnosis or screening Screening   Symptomatic for COVID-19 as defined by CDC No   Hospitalized for COVID-19 No   Admitted to ICU for COVID-19 No   Previously tested for COVID-19 No   Resident in a congregate (group) care setting No   Employed in healthcare setting No      09/02/19 2038   09/02/19 2031  Blood culture (routine x 2)  BLOOD CULTURE X 2,   STAT     09/02/19 2030   Signed and Held  CBC  (enoxaparin (LOVENOX)    CrCl >/= 30 ml/min)  Once,   R    Comments: Baseline for enoxaparin therapy IF NOT ALREADY DRAWN.  Notify MD if PLT < 100 K.    Signed and Held   Signed and Held  Creatinine, serum  (enoxaparin (LOVENOX)    CrCl >/= 30 ml/min)  Once,   R    Comments: Baseline for enoxaparin therapy IF NOT ALREADY DRAWN.    Signed and Held    Signed and Held  Creatinine, serum  (enoxaparin (LOVENOX)    CrCl >/= 30 ml/min)  Weekly,   R    Comments: while on enoxaparin therapy    Signed and Held   Signed and Held  Comprehensive metabolic panel  Tomorrow morning,   R     Signed and Held   Signed and Held  CBC  Tomorrow morning,   R  Signed and Held          Vitals/Pain Today's Vitals   09/02/19 1656 09/02/19 1804 09/02/19 1950 09/02/19 2129  BP: (!) 174/80  (!) 158/67 (!) 196/84  Pulse: 87 85 83 82  Resp: 18  16 16   Temp:      TempSrc:      SpO2: 100% 100% 100% 98%  PainSc:        Isolation Precautions No active isolations  Medications Medications  traZODone (DESYREL) tablet 50 mg (50 mg Oral Given 09/02/19 2111)  vancomycin (VANCOCIN) IVPB 1000 mg/200 mL premix (has no administration in time range)  lidocaine (PF) (XYLOCAINE) 1 % injection 5 mL (5 mLs Intradermal Given by Other 09/02/19 1642)  sodium chloride (PF) 0.9 % injection (  Given 09/02/19 1929)  iohexol (OMNIPAQUE) 300 MG/ML solution 75 mL (75 mLs Intravenous Contrast Given 09/02/19 1850)    Mobility walks

## 2019-09-02 NOTE — ED Notes (Signed)
Pt removed oxygen sensor from finger.

## 2019-09-02 NOTE — H&P (Signed)
History and Physical   Kristopher Evans:[id number] DOB: 06/18/1935 DOA: 09/02/2019  Referring MD/NP/PA: Dr. Lenna Gilfordysktra  PCP: Patient, No Pcp Per   Outpatient Specialists: None  Patient coming from: Skilled nursing facility  Chief Complaint: Left neck swelling  HPI: Kristopher Evans is a 84 y.o. male with medical history significant of dementia who lives in a skilled facility brought in due to left neck swelling and mild fever.  This has been noted to be present for the last 3 days.  It is behind the left ear.  Patient was seen in the ER and evaluated.  Appears to have an abscess.  Incision and drainage was attempted in the ER.  Initial attempt was not very successful.  ENT was consulted and recommends admission with IV antibiotics.  Dr. Annalee GentaShoemaker is willing to see the patient if not better for any further surgical intervention.  Patient is confused not able to give any adequate history due to his dementia.  History is therefore obtained mainly from nursing home records..  ED Course: Temperature is 99.2 blood pressure 196/84 pulse 108 respiratory of 18 oxygen sat 98% room air.  White count is 18.7 otherwise the rest of CBC and chemistry are within normal.  COVID-19 screen is currently pending.  CT neck showed 3 cm left neck abscess and surrounding cellulitis.  Also mildly enlarged left neck lymph nodes likely reactive.  I&D was done and patient is being admitted for further work-up  Review of Systems: As per HPI otherwise 10 point review of systems negative.    Past Medical History:  Diagnosis Date   Dementia (HCC)     History reviewed. No pertinent surgical history.   reports that he has never smoked. He does not have any smokeless tobacco history on file. He reports current alcohol use. No history on file for drug.  Allergies  Allergen Reactions   Penicillins     Not listed on MAR    No family history on file.   Prior to Admission medications   Medication Sig Start Date End Date  Taking? Authorizing Provider  aspirin EC 81 MG tablet Take 81 mg by mouth daily.   Yes [provider]  Cholecalciferol (VITAMIN D) 2000 units CAPS Take 2,000 Units by mouth daily.   Yes [provider]  liver oil-zinc oxide (DESITIN) 40 % ointment Apply 1 application topically as needed for irritation.   Yes [provider]  traZODone (DESYREL) 50 MG tablet Take 50 mg by mouth at bedtime.   Yes [provider]  triamcinolone cream (KENALOG) 0.1 % Apply 1 application topically 3 (three) times daily.   Yes [provider]  vitamin B-12 (CYANOCOBALAMIN) 1000 MCG tablet Take 1,000 mcg by mouth daily.   Yes [provider]  moxifloxacin (VIGAMOX) 0.5 % ophthalmic solution 1gtt to eyes, bilaterally, q2hours while awake for 2 days, THEN q6hrs for 5 days. Patient not taking: Reported on 09/02/2019 06/24/18   Myrna BlazerSchaevitz, David Matthew, MD    Physical Exam: Vitals:   09/02/19 1515 09/02/19 1656 09/02/19 1804 09/02/19 1950  BP: (!) 162/81 (!) 174/80  (!) 158/67  Pulse: 79 87 85 83  Resp: 16 18  16   Temp: 98.2 F (36.8 C)     TempSrc: Oral     SpO2: 100% 100% 100% 100%      Constitutional: Confused, agitated Vitals:   09/02/19 1515 09/02/19 1656 09/02/19 1804 09/02/19 1950  BP: (!) 162/81 (!) 174/80  (!) 158/67  Pulse: 79  87 85 83  Resp: 16 18  16   Temp: 98.2 F (36.8 C)     TempSrc: Oral     SpO2: 100% 100% 100% 100%   Eyes: PERRL, lids and conjunctivae normal ENMT: Mucous membranes are moist. Posterior pharynx clear of any exudate or lesions.Normal dentition.  Neck: Swollen area on the left upper neck area behind the left ear fluctuant about 4 x 4 cm, normal, supple, no masses, no thyromegaly Respiratory: clear to auscultation bilaterally, no wheezing, no crackles. Normal respiratory effort. No accessory muscle use.  Cardiovascular: Regular rate and rhythm, no murmurs / rubs / gallops. No extremity edema. 2+ pedal pulses. No carotid  bruits.  Abdomen: no tenderness, no masses palpated. No hepatosplenomegaly. Bowel sounds positive.  Musculoskeletal: no clubbing / cyanosis. No joint deformity upper and lower extremities. Good ROM, no contractures. Normal muscle tone.  Skin: no rashes, lesions, ulcers. No induration Neurologic: CN 2-12 grossly intact. Sensation intact, DTR normal. Strength 5/5 in all 4.  Psychiatric: Confused and agitated    Labs on Admission: I have personally reviewed following labs and imaging studies  CBC: Recent Labs  Lab 09/02/19 1642  WBC 18.7*  NEUTROABS 12.2*  HGB 13.7  HCT 44.0  MCV 98.2  PLT 174   Basic Metabolic Panel: Recent Labs  Lab 09/02/19 1642  NA 140  K 3.9  CL 103  CO2 25  GLUCOSE 106*  BUN 20  CREATININE 1.00  CALCIUM 8.7*   GFR: CrCl cannot be calculated (Unknown ideal weight.). Liver Function Tests: No results for input(s): AST, ALT, ALKPHOS, BILITOT, PROT, ALBUMIN in the last 168 hours. No results for input(s): LIPASE, AMYLASE in the last 168 hours. No results for input(s): AMMONIA in the last 168 hours. Coagulation Profile: No results for input(s): INR, PROTIME in the last 168 hours. Cardiac Enzymes: No results for input(s): CKTOTAL, CKMB, CKMBINDEX, TROPONINI in the last 168 hours. BNP (last 3 results) No results for input(s): PROBNP in the last 8760 hours. HbA1C: No results for input(s): HGBA1C in the last 72 hours. CBG: No results for input(s): GLUCAP in the last 168 hours. Lipid Profile: No results for input(s): CHOL, HDL, LDLCALC, TRIG, CHOLHDL, LDLDIRECT in the last 72 hours. Thyroid Function Tests: No results for input(s): TSH, T4TOTAL, FREET4, T3FREE, THYROIDAB in the last 72 hours. Anemia Panel: No results for input(s): VITAMINB12, FOLATE, FERRITIN, TIBC, IRON, RETICCTPCT in the last 72 hours. Urine analysis:    Component Value Date/Time   COLORURINE YELLOW (A) 03/26/2018 1312   APPEARANCEUR CLEAR (A) 03/26/2018 1312   LABSPEC 1.011  03/26/2018 1312   PHURINE 7.0 03/26/2018 1312   GLUCOSEU NEGATIVE 03/26/2018 1312   HGBUR NEGATIVE 03/26/2018 1312   BILIRUBINUR NEGATIVE 03/26/2018 1312   KETONESUR NEGATIVE 03/26/2018 1312   PROTEINUR NEGATIVE 03/26/2018 1312   NITRITE NEGATIVE 03/26/2018 1312   LEUKOCYTESUR NEGATIVE 03/26/2018 1312   Sepsis Labs: @LABRCNTIP (procalcitonin:4,lacticidven:4) )No results found for this or any previous visit (from the past 240 hour(s)).   Radiological Exams on Admission: Ct Soft Tissue Neck W Contrast  Result Date: 09/02/2019 CLINICAL DATA:  Abscess behind the left ear with drainage. Left lateral neck swelling. EXAM: CT NECK WITH CONTRAST TECHNIQUE: Multidetector CT imaging of the neck was performed using the standard protocol following the bolus administration of intravenous contrast. CONTRAST:  35mL OMNIPAQUE IOHEXOL 300 MG/ML  SOLN COMPARISON:  None. FINDINGS: Pharynx and larynx: No evidence of mass or swelling within limitations of motion artifact. Patent airway. No retropharyngeal fluid collection. Salivary  glands: No inflammation, mass, or stone. Thyroid: Unremarkable. Lymph nodes: Mildly enlarged left supraclavicular and level V lymph nodes measuring 9-11 mm in short axis, likely reactive. Vascular: Aortic and mild carotid artery atherosclerosis. Limited intracranial: Unremarkable. Visualized orbits: Bilateral cataract extraction. Mastoids and visualized paranasal sinuses: Clear. Skeleton: Advanced disc space narrowing at C5-6, C6-7, and C7-T1. Severe neural foraminal stenosis due to uncovertebral spurring on the right at C5-6 and on the left at C6-7. Upper chest: Clear lung apices. Other: 2.5 x 2.7 x 3.0 cm rim enhancing fluid collection in the left posterolateral neck centered in the subcutaneous/cutaneous tissues with overlying skin bulging, skin thickening, and subcutaneous inflammation. Inflammation extends into the underlying left posterior neck musculature without evidence of a separate  deep cervical abscess. IMPRESSION: 1. 3 cm left neck abscess and surrounding cellulitis. 2. Mildly enlarged left neck lymph nodes, likely reactive. 3. Aortic Atherosclerosis (ICD10-I70.0). Electronically Signed   By: Sebastian Ache M.D.   On: 09/02/2019 19:22      Assessment/Plan Principal Problem:   Neck abscess Active Problems:   Dementia (HCC)   HTN (hypertension)   Leucocytosis     #1 left neck abscess: Status post I&D.  Admit the patient.  IV Vanco and cefepime.  Cultures have been obtained both blood cultures and mild wound cultures.  Follow-up closely.  If not better reconsult Dr. Annalee Genta for further evaluation  #2 dementia: Patient has agitation.  At this point has been given trazodone but appears to have more reaction to it.  We will monitor with a sitter  #3 hypertension: Blood pressure is markedly elevated but no has no documented history of hypertension.  Will monitor and initiate treatment if indicated.  #4 leukocytosis: Secondary to abscess.  Monitor closely.   DVT prophylaxis: Lovenox Code Status: Full code Family Communication: No family at bedside Disposition Plan: Back to skilled facility Consults called: Dr. Annalee Genta consulted from the ER Admission status: Inpatient  Severity of Illness: The appropriate patient status for this patient is INPATIENT. Inpatient status is judged to be reasonable and necessary in order to provide the required intensity of service to ensure the patient's safety. The patient's presenting symptoms, physical exam findings, and initial radiographic and laboratory data in the context of their chronic comorbidities is felt to place them at high risk for further clinical deterioration. Furthermore, it is not anticipated that the patient will be medically stable for discharge from the hospital within 2 midnights of admission. The following factors support the patient status of inpatient.   " The patient's presenting symptoms include left neck  swelling. " The worrisome physical exam findings include swollen left side of the neck fluctuant. " The initial radiographic and laboratory data are worrisome because of 3 cm abscess. " The chronic co-morbidities include dementia.   * I certify that at the point of admission it is my clinical judgment that the patient will require inpatient hospital care spanning beyond 2 midnights from the point of admission due to high intensity of service, high risk for further deterioration and high frequency of surveillance required.Lonia Blood MD [facility] Pager [phone]  If 7PM-7AM, please contact night-coverage [email or web address] Password [facility]  09/02/2019, 8:57 PM

## 2019-09-02 NOTE — ED Notes (Signed)
Requested supplies at bedside 

## 2019-09-02 NOTE — ED Notes (Signed)
Sitter at bedside.

## 2019-09-03 ENCOUNTER — Other Ambulatory Visit: Payer: Self-pay

## 2019-09-03 DIAGNOSIS — F0391 Unspecified dementia with behavioral disturbance: Secondary | ICD-10-CM

## 2019-09-03 DIAGNOSIS — L03221 Cellulitis of neck: Secondary | ICD-10-CM

## 2019-09-03 DIAGNOSIS — I159 Secondary hypertension, unspecified: Secondary | ICD-10-CM

## 2019-09-03 DIAGNOSIS — D72829 Elevated white blood cell count, unspecified: Secondary | ICD-10-CM

## 2019-09-03 LAB — CBC
HCT: 40.6 % (ref 39.0–52.0)
Hemoglobin: 13 g/dL (ref 13.0–17.0)
MCH: 30.4 pg (ref 26.0–34.0)
MCHC: 32 g/dL (ref 30.0–36.0)
MCV: 95.1 fL (ref 80.0–100.0)
Platelets: 271 10*3/uL (ref 150–400)
RBC: 4.27 MIL/uL (ref 4.22–5.81)
RDW: 14.2 % (ref 11.5–15.5)
WBC: 18.2 10*3/uL — ABNORMAL HIGH (ref 4.0–10.5)
nRBC: 0 % (ref 0.0–0.2)

## 2019-09-03 LAB — COMPREHENSIVE METABOLIC PANEL
ALT: 25 U/L (ref 0–44)
AST: 33 U/L (ref 15–41)
Albumin: 3.2 g/dL — ABNORMAL LOW (ref 3.5–5.0)
Alkaline Phosphatase: 99 U/L (ref 38–126)
Anion gap: 11 (ref 5–15)
BUN: 16 mg/dL (ref 8–23)
CO2: 24 mmol/L (ref 22–32)
Calcium: 8.2 mg/dL — ABNORMAL LOW (ref 8.9–10.3)
Chloride: 99 mmol/L (ref 98–111)
Creatinine, Ser: 0.85 mg/dL (ref 0.61–1.24)
GFR calc Af Amer: >60 mL/min (ref >60)
GFR calc non Af Amer: >60 mL/min (ref >60)
Glucose, Bld: 125 mg/dL — ABNORMAL HIGH (ref 70–99)
Potassium: 3.6 mmol/L (ref 3.5–5.1)
Sodium: 134 mmol/L — ABNORMAL LOW (ref 135–145)
Total Bilirubin: 1.2 mg/dL (ref 0.3–1.2)
Total Protein: 6.6 g/dL (ref 6.5–8.1)

## 2019-09-03 LAB — MRSA PCR SCREENING: MRSA by PCR: POSITIVE — AB

## 2019-09-03 LAB — SARS CORONAVIRUS 2 (TAT 6-24 HRS): SARS Coronavirus 2: NEGATIVE

## 2019-09-03 MED ORDER — MUPIROCIN 2 % EX OINT
1.0000 "application " | TOPICAL_OINTMENT | Freq: Two times a day (BID) | CUTANEOUS | Status: AC
  Administered 2019-09-03 – 2019-09-07 (×9): 1 via NASAL
  Filled 2019-09-03 (×2): qty 22

## 2019-09-03 MED ORDER — CHLORHEXIDINE GLUCONATE CLOTH 2 % EX PADS
6.0000 | MEDICATED_PAD | Freq: Every day | CUTANEOUS | Status: AC
  Administered 2019-09-03 – 2019-09-06 (×3): 6 via TOPICAL

## 2019-09-03 MED ORDER — SODIUM CHLORIDE 0.9 % IV SOLN
2.0000 g | Freq: Two times a day (BID) | INTRAVENOUS | Status: DC
  Administered 2019-09-03: 2 g via INTRAVENOUS
  Filled 2019-09-03 (×2): qty 2

## 2019-09-03 MED ORDER — SODIUM CHLORIDE 0.9 % IV SOLN
2.0000 g | INTRAVENOUS | Status: DC
  Administered 2019-09-03 – 2019-09-05 (×3): 2 g via INTRAVENOUS
  Filled 2019-09-03 (×3): qty 2
  Filled 2019-09-03: qty 20

## 2019-09-03 MED ORDER — VANCOMYCIN HCL 10 G IV SOLR
1250.0000 mg | INTRAVENOUS | Status: DC
  Administered 2019-09-03: 1250 mg via INTRAVENOUS
  Filled 2019-09-03: qty 1250

## 2019-09-03 MED ORDER — HYDRALAZINE HCL 25 MG PO TABS
25.0000 mg | ORAL_TABLET | Freq: Four times a day (QID) | ORAL | Status: DC | PRN
  Administered 2019-09-04 – 2019-09-11 (×7): 25 mg via ORAL
  Filled 2019-09-03 (×8): qty 1

## 2019-09-03 NOTE — Progress Notes (Signed)
PROGRESS NOTE    Clyda HurdleJohn F Wombles  WUJ:[id number] DOB: 02/09/1935 DOA: 09/02/2019 PCP: Patient, No Pcp Per   Brief Narrative:  HPI per Dr. Earlie LouMohammad Garba on 09/02/2019 Clyda HurdleJohn F Stegner is a 84 y.o. male with medical history significant of dementia who lives in a skilled facility brought in due to left neck swelling and mild fever.  This has been noted to be present for the last 3 days.  It is behind the left ear.  Patient was seen in the ER and evaluated.  Appears to have an abscess.  Incision and drainage was attempted in the ER.  Initial attempt was not very successful.  ENT was consulted and recommends admission with IV antibiotics.  Dr. Annalee GentaShoemaker is willing to see the patient if not better for any further surgical intervention.  Patient is confused not able to give any adequate history due to his dementia.  History is therefore obtained mainly from nursing home records..  ED Course: Temperature is 99.2 blood pressure 196/84 pulse 108 respiratory of 18 oxygen sat 98% room air.  White count is 18.7 otherwise the rest of CBC and chemistry are within normal.  COVID-19 screen is currently pending.  CT neck showed 3 cm left neck abscess and surrounding cellulitis.  Also mildly enlarged left neck lymph nodes likely reactive.  I&D was done and patient is being admitted for further work-up  **Interim History Continue to have some induration and minimal drainage from his abscess site.  Will call ENT for further evaluation recommendations.  We will continue IV fluids at this time and patient remains severely demented and confused.  Assessment & Plan:   Principal Problem:   Neck abscess Active Problems:   Dementia (HCC)   HTN (hypertension)   Leucocytosis  Left Neck Abscess and Cellulitis -CT Neck showed "3 cm left neck abscess and surrounding cellulitis.  Mildly enlarged left neck lymph nodes, likely reactive. Aortic Atherosclerosis." -Status post I&D by EDP with minimal drainage -Admitted the  patient.   -Started on IV Vancomycin and IV Cefepime but will continue Vancomycin and De-escalate Cefepime to IV Ceftriaxone.   -Cultures have been obtained both blood cultures and mild wound cultures but I do not see that wound Cx's have been sent  -There is area still indurated and had minimal purulent drainage we will consult ENT I spoke with Dr. Jenne PaneBates and will formally see the patient -Leukocytosis is still elevated patient WBC went from 18.7 18.2 -Of note patient is MRSA positive via PCR and SARS-CoV-2 was negative -C/w Pain control with Tramadol 50 mg po q6hprn and Supportive Care with Zofran 4 mg po/IV q6hprn   Dementia -Patient has agitation this morning he was wearing soft mitten restraints.   -Given trazodone but appears to have more reaction to it -Given Haldol 2.5 mg IV yesterday  -Continue to monitor and may need a sitter  Hypertension -Blood pressure is markedly elevated but no has no documented history of hypertension.   -Likely in the setting of Infection -Will monitor and initiate treatment with prn 25 mg po q6hprn Hydralazine for SBP>160 or DBP>90 -BP this AM was 156/98   Leukocytosis -Secondary to abscess -Mild and is trending down  -WBC went from 18.7 -> 18.2 -C/w Abx as Above -Continue to Monitor and Trend and Repeat CBC in AM   Hyponatremia -Mild as Na+ is 134 -C/w NS at 75 mL/hr for now -Continue to Monitor and Trend -Repeat CMP in AM   Hyperglycemia -Patient's Blood Sugar on Admission  was elevated at 106 and repeat this AM was 125 -Check HbA1c in the AM  -Continue to Monitor Blood Sugars Carefully and if necessary will place on Sensitive Novolog SSI  DVT prophylaxis: Enoxaparin 40 mg sq q24h Code Status: FULL CODE Family Communication: No family at bedside  Disposition Plan: Anticipate D/C Back to SNF when improved and Abscess and Cellulitis is resolved   Consultants:   ENT Dr. Jenne Pane    Procedures: I and D Done by EDP    Antimicrobials:    Anti-infectives (From admission, onward)   Start     Dose/Rate Route Frequency Ordered Stop   09/03/19 2200  cefTRIAXone (ROCEPHIN) 2 g in sodium chloride 0.9 % 100 mL IVPB     2 g 200 mL/hr over 30 Minutes Intravenous Every 24 hours 09/03/19 1125     09/03/19 1800  vancomycin (VANCOCIN) 1,250 mg in sodium chloride 0.9 % 250 mL IVPB     1,250 mg 166.7 mL/hr over 90 Minutes Intravenous Every 24 hours 09/03/19 0546     09/03/19 1000  ceFEPIme (MAXIPIME) 2 g in sodium chloride 0.9 % 100 mL IVPB  Status:  Discontinued     2 g 200 mL/hr over 30 Minutes Intravenous Every 12 hours 09/03/19 0546 09/03/19 1125   09/02/19 2145  ceFEPIme (MAXIPIME) 2 g in sodium chloride 0.9 % 100 mL IVPB     2 g 200 mL/hr over 30 Minutes Intravenous  Once 09/02/19 2143 09/03/19 0239   09/02/19 2130  vancomycin (VANCOCIN) IVPB 1000 mg/200 mL premix     1,000 mg 200 mL/hr over 60 Minutes Intravenous  Once 09/02/19 2122 09/03/19 0004     Subjective: Seen and examined at bedside and patient was wearing his soft mitten restraints and was pleasantly confused and demented.  No nausea or vomiting.  Had some minimal drainage.  No chest pain, lightheadedness or dizziness.  ENT will be called to evaluate for further possible I&D.  No other concerns or complaints at this time  Objective: Vitals:   09/02/19 2129 09/02/19 2216 09/03/19 0528 09/03/19 0609  BP: (!) 196/84 (!) 188/90  (!) 156/98  Pulse: 82 (!) 108  95  Resp: Temp:  99.2 F (37.3 C)  98.3 F (36.8 C)  TempSrc:  Oral  Oral  SpO2: 98% 100%  97%  Weight:   58.9 kg   Height:    (1.676 m)     Intake/Output Summary (Last 24 hours) at 09/03/2019 1237 Last data filed at 09/03/2019 1000 Gross per 24 hour  Intake 843.51 ml  Output 1300 ml  Net -456.49 ml   Filed Weights   09/03/19 0528  Weight: 58.9 kg   Examination: Physical Exam:  Constitutional: Thin Caucasian elderly pleasantly demented Caucasian male in NAD and appears calm and  comfortable Eyes: Lids and conjunctivae normal, sclerae anicteric  ENMT: External Ears, Nose appear normal. Grossly normal hearing.  Neck: Has a large abscess and cellulitic area on the left side of his neck with some induration Respiratory: Diminished to auscultation bilaterally, no wheezing, rales, rhonchi or crackles. Normal respiratory effort and patient is not tachypenic. No accessory muscle use.   Cardiovascular: RRR, no murmurs / rubs / gallops. S1 and S2 auscultated. No extremity edema.  Abdomen: Soft, non-tender, non-distended. Has a large inguinal hernia on the Right. Bowel sounds positive x4.  GU: Deferred. Musculoskeletal: No clubbing / cyanosis of digits/nails. No joint deformity upper and lower extremities; Wearing safety mittens  Skin: Cellulitic and indurated area on the Left neck with a quarter sized lesion that is raised with minimal purulent drainage. Has plaque like lesions on Right Leg and around the umbilicus. No induration; Warm and dry.  Neurologic: CN 2-12 grossly intact with no focal deficits. Romberg sign and cerebellar reflexes not assessed.  Psychiatric: Impaired judgment and insight. Alert and oriented x 3. Calm mood and appropriate affect.   Data Reviewed: I have personally reviewed following labs and imaging studies  CBC: Recent Labs  Lab 09/02/19 1642 09/03/19 0346  WBC 18.7* 18.2*  NEUTROABS 12.2*  --   HGB 13.7 13.0  HCT 44.0 40.6  MCV 98.2 95.1  PLT 319 271   Basic Metabolic Panel: Recent Labs  Lab 09/02/19 1642 09/03/19 0346  NA 140 134*  K 3.9 3.6  CL 103 99  CO2 25 24  GLUCOSE 106* 125*  BUN 20 16  CREATININE 1.00 0.85  CALCIUM 8.7* 8.2*   GFR: Estimated Creatinine Clearance: 55.8 mL/min (by C-G formula based on SCr of 0.85 mg/dL). Liver Function Tests: Recent Labs  Lab 09/03/19 0346  AST 33  ALT 25  ALKPHOS 99  BILITOT 1.2  PROT 6.6  ALBUMIN 3.2*   No results for input(s): LIPASE, AMYLASE in the last 168 hours. No results  for input(s): AMMONIA in the last 168 hours. Coagulation Profile: No results for input(s): INR, PROTIME in the last 168 hours. Cardiac Enzymes: No results for input(s): CKTOTAL, CKMB, CKMBINDEX, TROPONINI in the last 168 hours. BNP (last 3 results) No results for input(s): PROBNP in the last 8760 hours. HbA1C: No results for input(s): HGBA1C in the last 72 hours. CBG: No results for input(s): GLUCAP in the last 168 hours. Lipid Profile: No results for input(s): CHOL, HDL, LDLCALC, TRIG, CHOLHDL, LDLDIRECT in the last 72 hours. Thyroid Function Tests: No results for input(s): TSH, T4TOTAL, FREET4, T3FREE, THYROIDAB in the last 72 hours. Anemia Panel: No results for input(s): VITAMINB12, FOLATE, FERRITIN, TIBC, IRON, RETICCTPCT in the last 72 hours. Sepsis Labs: No results for input(s): PROCALCITON, LATICACIDVEN in the last 168 hours.  Recent Results (from the past 240 hour(s))  Blood culture (routine x 2)     Status: None (Preliminary result)   Collection Time: 09/02/19  9:13 PM   Specimen: BLOOD  Result Value Ref Range Status   Specimen Description   Final    BLOOD LEFT ANTECUBITAL Performed at [facility], [address] [place], [place] [address]    Special Requests   Final    BOTTLES DRAWN AEROBIC AND ANAEROBIC Blood Culture results may not be optimal due to an inadequate volume of blood received in culture bottles Performed at [facility], [address] [place], [place] [address]    Culture   Final    NO GROWTH < 12 HOURS Performed at [facility], [address], [place], [place] [address]    Report Status PENDING  Incomplete  Blood culture (routine x 2)     Status: None (Preliminary result)   Collection Time: 09/02/19  9:13 PM   Specimen: BLOOD  Result Value Ref Range Status   Specimen Description   Final    BLOOD RIGHT ANTECUBITAL Performed at [facility], [address], [place], [place]  [address]    Special Requests   Final    BOTTLES DRAWN AEROBIC AND ANAEROBIC Blood Culture adequate volume Performed at [facility], [address] [place], [place]  [address]    Culture   Final    NO GROWTH < 12 HOURS Performed at [facility], [address], [place], [place] [address]    Report Status PENDING  Incomplete  SARS CORONAVIRUS 2 (TAT 6-24 HRS) Nasopharyngeal Nasopharyngeal Swab     Status: None   Collection Time: 09/02/19  9:13 PM   Specimen: Nasopharyngeal Swab  Result Value Ref Range Status   SARS Coronavirus 2 NEGATIVE NEGATIVE Final    Comment: (NOTE) SARS-CoV-2 target nucleic acids are NOT DETECTED. The SARS-CoV-2 RNA is generally detectable in upper and lower respiratory specimens during the acute phase of infection. Negative results do not preclude SARS-CoV-2 infection, do not rule out co-infections with other pathogens, and should not be used as the sole basis for treatment or other patient management decisions. Negative results must be combined with clinical observations, patient history, and epidemiological information. The expected result is Negative. Fact Sheet for Patients: HairSlick.no Fact Sheet for Healthcare Providers: quierodirigir.com This test is not yet approved or cleared by the [place] FDA and  has been authorized for detection and/or diagnosis of SARS-CoV-2 by FDA under an Emergency Use Authorization (EUA). This EUA will remain  in effect (meaning this test can be used) for the duration of the COVID-19 declaration under Section 56 4(b)(1) of the Act, 21 U.S.C. section 360bbb-3(b)(1), unless the authorization is terminated or revoked sooner. Performed at [facility], [address], [place], [place] [address]   MRSA PCR Screening     Status: Abnormal   Collection Time: 09/02/19 10:57 PM   Specimen: Nasal Mucosa; Nasopharyngeal  Result Value Ref  Range Status   MRSA by PCR POSITIVE (A) NEGATIVE Final    Comment:        The GeneXpert MRSA Assay (FDA approved for NASAL specimens only), is one component of a comprehensive MRSA colonization surveillance program. It is not intended to diagnose MRSA infection nor to guide or monitor treatment for MRSA infections. CRITICAL RESULT CALLED TO, READ BACK BY AND VERIFIED WITH: RN [place] AT 4235 09/03/19 CRUICKSHANK A Performed at [facility], [address], [place], [place] [address]     Radiology Studies: Ct Soft Tissue Neck W Contrast  Result Date: 09/02/2019 CLINICAL DATA:  Abscess behind the left ear with drainage. Left lateral neck swelling. EXAM: CT NECK WITH CONTRAST TECHNIQUE: Multidetector CT imaging of the neck was performed using the standard protocol following the bolus administration of intravenous contrast. CONTRAST:  11mL OMNIPAQUE IOHEXOL 300 MG/ML  SOLN COMPARISON:  None. FINDINGS: Pharynx and larynx: No evidence of mass or swelling within limitations of motion artifact. Patent airway. No retropharyngeal fluid collection. Salivary glands: No inflammation, mass, or stone. Thyroid: Unremarkable. Lymph nodes: Mildly enlarged left supraclavicular and level V lymph nodes measuring 9-11 mm in short axis, likely reactive. Vascular: Aortic and mild carotid artery atherosclerosis. Limited intracranial: Unremarkable. Visualized orbits: Bilateral cataract extraction. Mastoids and visualized paranasal sinuses: Clear. Skeleton: Advanced disc space narrowing at C5-6, C6-7, and C7-T1. Severe neural foraminal stenosis due to uncovertebral spurring on the right at C5-6 and on the left at C6-7. Upper chest: Clear lung apices. Other: 2.5 x 2.7 x 3.0 cm rim enhancing fluid collection in the left posterolateral neck centered in the subcutaneous/cutaneous tissues with overlying skin bulging, skin thickening, and subcutaneous inflammation. Inflammation extends into the underlying  left posterior neck musculature without evidence of a separate deep cervical abscess. IMPRESSION: 1. 3 cm left neck abscess and surrounding  cellulitis. 2. Mildly enlarged left neck lymph nodes, likely reactive. 3. Aortic Atherosclerosis (ICD10-I70.0). Electronically Signed   By: Logan Bores M.D.   On: 09/02/2019 19:22   Scheduled Meds:  aspirin EC  81 mg Oral Daily   Chlorhexidine Gluconate Cloth  6 each Topical Q0600   cholecalciferol  2,000 Units Oral Daily   enoxaparin (LOVENOX) injection  40 mg Subcutaneous QHS   mupirocin ointment  1 application Nasal BID   traZODone  50 mg Oral QHS   vitamin B-12  1,000 mcg Oral Daily   Continuous Infusions:  sodium chloride 75 mL/hr at 09/02/19 2240   cefTRIAXone (ROCEPHIN)  IV     vancomycin      LOS: 1 day   Kerney Elbe, DO [facility] PAGER is on AMION  If 7PM-7AM, please contact night-coverage [email or web address]

## 2019-09-03 NOTE — Consult Note (Signed)
ENT CONSULT:  Reason for Consult: Left neck abscess Referring Physician: Hospitalist Svc  Kristopher Evans is an 84 y.o. male.  HPI: Patient admitted to [facility] from the emergency department with a 3-day history of erythema and swelling involving the left posterior lateral neck.  No history of trauma or prior infection.  The patient was evaluated in the emergency department and a CT scan was performed which showed a cystic mass involving the skin and deep subcutaneous tissue in the left posterior lateral neck consistent with a soft tissue abscess.  The emergency room physician performed a bedside incision and drainage for a moderate amount of purulent material.  Patient had elevated white blood cell count and low-grade fever.  Past Medical History:  Diagnosis Date  . Dementia (HCC)     History reviewed. No pertinent surgical history.  No family history on file.  Social History:  reports that he has never smoked. He does not have any smokeless tobacco history on file. He reports current alcohol use. No history on file for drug.  Allergies:  Allergies  Allergen Reactions  . Penicillins     Not listed on MAR    Medications: I have reviewed the patient's current medications.  Results for orders placed or performed during the hospital encounter of 09/02/19 (from the past 48 hour(s))  CBC with Differential     Status: Abnormal   Collection Time: 09/02/19  4:42 PM  Result Value Ref Range   WBC 18.7 (H) 4.0 - 10.5 K/uL   RBC 4.48 4.22 - 5.81 MIL/uL   Hemoglobin 13.7 13.0 - 17.0 g/dL   HCT 82.9 56.2 - 13.0 %   MCV 98.2 80.0 - 100.0 fL   MCH 30.6 26.0 - 34.0 pg   MCHC 31.1 30.0 - 36.0 g/dL   RDW 86.5 78.4 - 69.6 %   Platelets 319 150 - 400 K/uL   nRBC 0.0 0.0 - 0.2 %   Neutrophils Relative % 65 %   Neutro Abs 12.2 (H) 1.7 - 7.7 K/uL   Lymphocytes Relative 25 %   Lymphs Abs 4.7 (H) 0.7 - 4.0 K/uL   Monocytes Relative 8 %   Monocytes Absolute 1.4 (H) 0.1 - 1.0 K/uL   Eosinophils Relative 1 %   Eosinophils Absolute 0.2 0.0 - 0.5 K/uL   Basophils Relative 0 %   Basophils Absolute 0.1 0.0 - 0.1 K/uL   Immature Granulocytes 1 %   Abs Immature Granulocytes 0.09 (H) 0.00 - 0.07 K/uL    Comment: Performed at [facility], [address] [place], [place] [address]  Basic metabolic panel     Status: Abnormal   Collection Time: 09/02/19  4:42 PM  Result Value Ref Range   Sodium 140 135 - 145 mmol/L   Potassium 3.9 3.5 - 5.1 mmol/L   Chloride 103 98 - 111 mmol/L   CO2 25 22 - 32 mmol/L   Glucose, Bld 106 (H) 70 - 99 mg/dL   BUN 20 8 - 23 mg/dL   Creatinine, Ser 4.13 0.61 - 1.24 mg/dL   Calcium 8.7 (L) 8.9 - 10.3 mg/dL   GFR calc non Af Amer >60 >60 mL/min   GFR calc Af Amer >60 >60 mL/min   Anion gap 12 5 - 15    Comment: Performed at [facility], [address], [place], [place] [address]  Blood culture (routine x 2)     Status: None (Preliminary result)   Collection Time: 09/02/19  9:13 PM   Specimen: BLOOD  Result Value Ref Range   Specimen Description      BLOOD LEFT ANTECUBITAL Performed at [facility], [place] [address], [place], [place] [address]    Special Requests      BOTTLES DRAWN AEROBIC AND ANAEROBIC Blood Culture results may not be optimal due to an inadequate volume of blood received in culture bottles Performed at [facility], [place] [address] [place], [place] [address]    Culture      NO GROWTH < 12 HOURS Performed at [place] [address], [place], [place] [address]    Report Status PENDING   Blood culture (routine x 2)     Status: None (Preliminary result)   Collection Time: 09/02/19  9:13 PM   Specimen: BLOOD  Result Value Ref Range   Specimen Description      BLOOD RIGHT ANTECUBITAL Performed at [facility], [place] [address], [place], [place] [address]    Special Requests      BOTTLES DRAWN AEROBIC AND ANAEROBIC  Blood Culture adequate volume Performed at [place] [address] [place], [place] [address]    Culture      NO GROWTH < 12 HOURS Performed at [place] [address], [place], [place] [address]    Report Status PENDING   SARS CORONAVIRUS 2 (TAT 6-24 HRS) Nasopharyngeal Nasopharyngeal Swab     Status: None   Collection Time: 09/02/19  9:13 PM   Specimen: Nasopharyngeal Swab  Result Value Ref Range   SARS Coronavirus 2 NEGATIVE NEGATIVE    Comment: (NOTE) SARS-CoV-2 target nucleic acids are NOT DETECTED. The SARS-CoV-2 RNA is generally detectable in upper and lower respiratory specimens during the acute phase of infection. Negative results do not preclude SARS-CoV-2 infection, do not rule out co-infections with other pathogens, and should not be used as the sole basis for treatment or other patient management decisions. Negative results must be combined with clinical observations, patient history, and epidemiological information. The expected result is Negative. Fact Sheet for Patients: SugarRoll.be Fact Sheet for Healthcare Providers: https://www.woods-mathews.com/ This test is not yet approved or cleared by the [place] FDA and  has been authorized for detection and/or diagnosis of SARS-CoV-2 by FDA under an Emergency Use Authorization (EUA). This EUA will remain  in effect (meaning this test can be used) for the duration of the COVID-19 declaration under Section 56 4(b)(1) of the Act, 21 U.S.C. section 360bbb-3(b)(1), unless the authorization is terminated or revoked sooner. Performed at [facility], [place] [address], [place], [place] [address]   MRSA PCR Screening     Status: Abnormal   Collection Time: 09/02/19 10:57 PM   Specimen: Nasal Mucosa; Nasopharyngeal  Result Value Ref Range   MRSA by PCR POSITIVE (A) NEGATIVE    Comment:        The GeneXpert MRSA Assay (FDA approved for  NASAL specimens only), is one component of a comprehensive MRSA colonization surveillance program. It is not intended to diagnose MRSA infection nor to guide or monitor treatment for MRSA infections. CRITICAL RESULT CALLED TO, READ BACK BY AND VERIFIED WITH: RN V[place] AT 9892 09/03/19 CRUICKSHANK A Performed at [facility], [place] [address], [place], [place] [address]   Comprehensive metabolic panel     Status: Abnormal   Collection Time: 09/03/19  3:46 AM  Result Value Ref Range   Sodium 134 (L) 135 -  145 mmol/L   Potassium 3.6 3.5 - 5.1 mmol/L   Chloride 99 98 - 111 mmol/L   CO2 24 22 - 32 mmol/L   Glucose, Bld 125 (H) 70 - 99 mg/dL   BUN 16 8 - 23 mg/dL   Creatinine, Ser 4.090.85 0.61 - 1.24 mg/dL   Calcium 8.2 (L) 8.9 - 10.3 mg/dL   Total Protein 6.6 6.5 - 8.1 g/dL   Albumin 3.2 (L) 3.5 - 5.0 g/dL   AST 33 15 - 41 U/L   ALT 25 0 - 44 U/L   Alkaline Phosphatase 99 38 - 126 U/L   Total Bilirubin 1.2 0.3 - 1.2 mg/dL   GFR calc non Af Amer >60 >60 mL/min   GFR calc Af Amer >60 >60 mL/min   Anion gap 11 5 - 15    Comment: Performed at [facility], [address] [place], [place] [address]  CBC     Status: Abnormal   Collection Time: 09/03/19  3:46 AM  Result Value Ref Range   WBC 18.2 (H) 4.0 - 10.5 K/uL   RBC 4.27 4.22 - 5.81 MIL/uL   Hemoglobin 13.0 13.0 - 17.0 g/dL   HCT 47.840.6 29.539.0 - 62.152.0 %   MCV 95.1 80.0 - 100.0 fL   MCH 30.4 26.0 - 34.0 pg   MCHC 32.0 30.0 - 36.0 g/dL   RDW 30.814.2 65.711.5 - 84.615.5 %   Platelets 271 150 - 400 K/uL   nRBC 0.0 0.0 - 0.2 %    Comment: Performed at [facility], [address] [place], [place] [address]    Ct Soft Tissue Neck W Contrast  Result Date: 09/02/2019 CLINICAL DATA:  Abscess behind the left ear with drainage. Left lateral neck swelling. EXAM: CT NECK WITH CONTRAST TECHNIQUE: Multidetector CT imaging of the neck was performed using the standard protocol following the bolus  administration of intravenous contrast. CONTRAST:  75mL OMNIPAQUE IOHEXOL 300 MG/ML  SOLN COMPARISON:  None. FINDINGS: Pharynx and larynx: No evidence of mass or swelling within limitations of motion artifact. Patent airway. No retropharyngeal fluid collection. Salivary glands: No inflammation, mass, or stone. Thyroid: Unremarkable. Lymph nodes: Mildly enlarged left supraclavicular and level V lymph nodes measuring 9-11 mm in short axis, likely reactive. Vascular: Aortic and mild carotid artery atherosclerosis. Limited intracranial: Unremarkable. Visualized orbits: Bilateral cataract extraction. Mastoids and visualized paranasal sinuses: Clear. Skeleton: Advanced disc space narrowing at C5-6, C6-7, and C7-T1. Severe neural foraminal stenosis due to uncovertebral spurring on the right at C5-6 and on the left at C6-7. Upper chest: Clear lung apices. Other: 2.5 x 2.7 x 3.0 cm rim enhancing fluid collection in the left posterolateral neck centered in the subcutaneous/cutaneous tissues with overlying skin bulging, skin thickening, and subcutaneous inflammation. Inflammation extends into the underlying left posterior neck musculature without evidence of a separate deep cervical abscess. IMPRESSION: 1. 3 cm left neck abscess and surrounding cellulitis. 2. Mildly enlarged left neck lymph nodes, likely reactive. 3. Aortic Atherosclerosis (ICD10-I70.0). Electronically Signed   By: Sebastian AcheAllen  Grady M.D.   On: 09/02/2019 19:22    ROS:ROS 12 systems reviewed and negative except as stated in HPI   Blood pressure (!) 156/98, pulse 95, temperature 98.3 F (36.8 C), temperature source Oral, resp. rate 16, height 5\' 6"  (1.676 m), weight 58.9 kg, SpO2 97 %.  PHYSICAL EXAM: General appearance - Patient is arousable and cooperative. Mental status - confused Mouth - mucous membranes moist, pharynx normal without lesions and Edentulous,  no mucosal mass or lesion Neck -no palpable adenopathy.  The patient has a left posterior  lateral tender swelling with surrounding erythema consistent with his history of recent infection.  Studies Reviewed: CT scan of the neck as above.  Patient has a soft tissue swelling involving the skin and deep subcutaneous tissue in the left posterior lateral neck  Assessment/Plan: The patient was admitted to [facility] for intravenous antibiotic therapy for a presumed left neck abscess.  He has cellulitic changes in the skin surrounding the area and is moderately tender.  CT scan showed a 2 x 3 cm soft tissue mass that extended to the deep subcutaneous region.  Given these findings I recommended incision and drainage under anesthesia.  Surgical procedure scheduled for 09/04/2019 under general anesthesia at 10:30 AM.  Continue current medical therapy.  Message left for his niece/guardian regarding the scheduled procedure.  Osborn Coho 09/03/2019, 12:57 PM

## 2019-09-03 NOTE — Progress Notes (Signed)
Spoke with patients niece, "Lovey Newcomer" on the phone today to get verbal consent for surgery. Sandy did grant permission for surgery and stated that her uncle Mr Coiner is a flight risk and is in a secured memory care facility. Permission for surgery was obtained.

## 2019-09-03 NOTE — Progress Notes (Signed)
Upon assessment patient is calm, no need for restraints at the moment.

## 2019-09-03 NOTE — Progress Notes (Signed)
Patient has dementia, patient became combative, continued to attempt to get out of bed and hitting and kicking. Contacted MD for order for something other than trazodone that was given in ED. MD ordered haldol and restraints. Patient is stable and being monitored. Could not complete admission screening due to patient's mental status. Dawson Bills, RN

## 2019-09-03 NOTE — TOC Initial Note (Signed)
Transition of Care [facility]) - Initial/Assessment Note    Patient Details  Name: Kristopher Evans MRN: [id number] Date of Birth: [birth year]  [facility]) CM/SW Contact:    Nelwyn Salisbury, LCSW Phone Number: [phone] 09/03/2019, 10:02 AM  Clinical Narrative:      Pt admitted with cellulitis from [facility] memory care ALF, where he has resided for almost one year.  Spoke with staff at [facility], they are aware of pt's admission. Report his niece is his main family contact (below). At baseline he is not oriented but to self, forgetful. Ambulates and eats independently, needs supervision/assistance with bathing/dressing.      TOC team will follow to coordinate at DC. Anticipate return to memory care ALF.      Expected Discharge Plan: Assisted Living Barriers to Discharge: Other (comment)(medical stability)   Patient Goals and CMS Choice Patient states their goals for this hospitalization and ongoing recovery are:: did not participate, not oriented      Expected Discharge Plan and Services Expected Discharge Plan: Assisted Living In-house Referral: Clinical Social Work Discharge Planning Services: CM Consult   Living arrangements for the past 2 months: Assisted Living Facility                                      Prior Living Arrangements/Services Living arrangements for the past 2 months: Assisted Living Facility Lives with:: Facility Resident Patient language and need for interpreter reviewed:: Yes Do you feel safe going back to the place where you live?: Yes      Need for Family Participation in Patient Care: Yes (Comment)(pt with dementia) Care giver support system in place?: Yes (comment)(memory care resident)   Criminal Activity/Legal Involvement Pertinent to Current Situation/Hospitalization: No - Comment as needed  Activities of Daily Living Home Assistive Devices/Equipment: Other (Comment)(SNF) ADL Screening (condition at time of  admission) Patient's cognitive ability adequate to safely complete daily activities?: No Is the patient deaf or have difficulty hearing?: No Does the patient have difficulty seeing, even when wearing glasses/contacts?: No Does the patient have difficulty concentrating, remembering, or making decisions?: Yes Patient able to express need for assistance with ADLs?: No Does the patient have difficulty dressing or bathing?: Yes Independently performs ADLs?: No Communication: Needs assistance Is this a change from baseline?: Pre-admission baseline Dressing (OT): Needs assistance Is this a change from baseline?: Pre-admission baseline Grooming: Needs assistance Is this a change from baseline?: Pre-admission baseline Feeding: Needs assistance Is this a change from baseline?: Pre-admission baseline Bathing: Needs assistance Is this a change from baseline?: Pre-admission baseline Toileting: Needs assistance Is this a change from baseline?: Pre-admission baseline In/Out Bed: Needs assistance Is this a change from baseline?: Pre-admission baseline Walks in Home: Needs assistance Is this a change from baseline?: Pre-admission baseline Does the patient have difficulty walking or climbing stairs?: Yes Weakness of Legs: None Weakness of Arms/Hands: None  Permission Sought/Granted Permission sought to share information with : Facility Medical sales representative, Family Supports Permission granted to share information with : (pt not oriented)  Share Information with NAME: [phone] niece Graylon Good  Permission granted to share info w AGENCY: [facility] memory care [phone]        Emotional Assessment Appearance:: Appears stated age     Orientation: : Oriented to Self Alcohol / Substance Use: Not Applicable Psych Involvement: No (comment)  Admission diagnosis:  Abscess on neck Patient Active Problem  List   Diagnosis Date Noted  . Neck abscess 09/02/2019  . Dementia ([place])  09/02/2019  . HTN (hypertension) 09/02/2019  . Leucocytosis 09/02/2019   PCP:  Patient, No Pcp Per Pharmacy:  No Pharmacies Listed    Social Determinants of Health (SDOH) Interventions    Readmission Risk Interventions No flowsheet data found.

## 2019-09-03 NOTE — Progress Notes (Signed)
Pharmacy Antibiotic Note  ZAIDEN Evans is a 84 y.o. male admitted on 09/02/2019 with cellulitis.  Pharmacy has been consulted for Vancomycin, cefepime dosing.  Plan:  Vancomycin 1gm iv x1, then Vancomycin 1250 mg IV Q 24 hrs. Goal AUC 400-550. Expected AUC: 542 SCr used: 0.85  Cefepime 2gm iv x1, then 2gm iv q12hr   Height: 5\' 6"  (167.6 cm) Weight: 129 lb 13.6 oz (58.9 kg) IBW/kg (Calculated) : 63.8  Temp (24hrs), Avg:98.7 F (37.1 C), Min:98.2 F (36.8 C), Max:99.2 F (37.3 C)  Recent Labs  Lab 09/02/19 1642 09/03/19 0346  WBC 18.7* 18.2*  CREATININE 1.00 0.85    Estimated Creatinine Clearance: 55.8 mL/min (by C-G formula based on SCr of 0.85 mg/dL).    Allergies  Allergen Reactions  . Penicillins     Not listed on MAR    Antimicrobials this admission: Vancomycin 09/03/2019 >> Cefepime 09/03/2019 >>   Dose adjustments this admission: -  Microbiology results: -  Thank you for allowing pharmacy to be a part of this patient's care.  Nani Skillern Crowford 09/03/2019 5:47 AM

## 2019-09-04 ENCOUNTER — Encounter (HOSPITAL_COMMUNITY)
Admission: EM | Disposition: A | Discharge status: Skilled Nursing Facility | Payer: Self-pay | Source: Skilled Nursing Facility | Attending: Internal Medicine

## 2019-09-04 ENCOUNTER — Inpatient Hospital Stay (HOSPITAL_COMMUNITY): Payer: PPO | Admitting: Anesthesiology

## 2019-09-04 DIAGNOSIS — E876 Hypokalemia: Secondary | ICD-10-CM

## 2019-09-04 DIAGNOSIS — D649 Anemia, unspecified: Secondary | ICD-10-CM

## 2019-09-04 DIAGNOSIS — L8990 Pressure ulcer of unspecified site, unspecified stage: Secondary | ICD-10-CM | POA: Insufficient documentation

## 2019-09-04 HISTORY — PX: MINOR IRRIGATION AND DEBRIDEMENT OF WOUND: SHX6239

## 2019-09-04 LAB — CBC WITH DIFFERENTIAL/PLATELET
Abs Immature Granulocytes: 0.18 10*3/uL — ABNORMAL HIGH (ref 0.00–0.07)
Basophils Absolute: 0.1 10*3/uL (ref 0.0–0.1)
Basophils Relative: 1 %
Eosinophils Absolute: 0.1 10*3/uL (ref 0.0–0.5)
Eosinophils Relative: 0 %
HCT: 40.4 % (ref 39.0–52.0)
Hemoglobin: 12.9 g/dL — ABNORMAL LOW (ref 13.0–17.0)
Immature Granulocytes: 1 %
Lymphocytes Relative: 15 %
Lymphs Abs: 2.7 10*3/uL (ref 0.7–4.0)
MCH: 30.4 pg (ref 26.0–34.0)
MCHC: 31.9 g/dL (ref 30.0–36.0)
MCV: 95.1 fL (ref 80.0–100.0)
Monocytes Absolute: 1.4 10*3/uL — ABNORMAL HIGH (ref 0.1–1.0)
Monocytes Relative: 8 %
Neutro Abs: 13.5 10*3/uL — ABNORMAL HIGH (ref 1.7–7.7)
Neutrophils Relative %: 75 %
Platelets: 286 10*3/uL (ref 150–400)
RBC: 4.25 MIL/uL (ref 4.22–5.81)
RDW: 14.3 % (ref 11.5–15.5)
WBC: 17.9 10*3/uL — ABNORMAL HIGH (ref 4.0–10.5)
nRBC: 0 % (ref 0.0–0.2)

## 2019-09-04 LAB — MAGNESIUM: Magnesium: 2 mg/dL (ref 1.7–2.4)

## 2019-09-04 LAB — COMPREHENSIVE METABOLIC PANEL
ALT: 23 U/L (ref 0–44)
AST: 29 U/L (ref 15–41)
Albumin: 3 g/dL — ABNORMAL LOW (ref 3.5–5.0)
Alkaline Phosphatase: 89 U/L (ref 38–126)
Anion gap: 11 (ref 5–15)
BUN: 14 mg/dL (ref 8–23)
CO2: 23 mmol/L (ref 22–32)
Calcium: 8 mg/dL — ABNORMAL LOW (ref 8.9–10.3)
Chloride: 105 mmol/L (ref 98–111)
Creatinine, Ser: 0.92 mg/dL (ref 0.61–1.24)
GFR calc Af Amer: >60 mL/min (ref >60)
GFR calc non Af Amer: >60 mL/min (ref >60)
Glucose, Bld: 114 mg/dL — ABNORMAL HIGH (ref 70–99)
Potassium: 3.3 mmol/L — ABNORMAL LOW (ref 3.5–5.1)
Sodium: 139 mmol/L (ref 135–145)
Total Bilirubin: 0.9 mg/dL (ref 0.3–1.2)
Total Protein: 6.5 g/dL (ref 6.5–8.1)

## 2019-09-04 LAB — PHOSPHORUS: Phosphorus: 2.7 mg/dL (ref 2.5–4.6)

## 2019-09-04 SURGERY — MINOR IRRIGATION AND DEBRIDEMENT OF WOUND
Anesthesia: General | Site: Neck | Laterality: Left

## 2019-09-04 MED ORDER — PROPOFOL 10 MG/ML IV BOLUS
INTRAVENOUS | Status: AC
  Filled 2019-09-04: qty 20

## 2019-09-04 MED ORDER — LIDOCAINE-EPINEPHRINE 1 %-1:100000 IJ SOLN
INTRAMUSCULAR | Status: DC | PRN
  Administered 2019-09-04: 2 mL

## 2019-09-04 MED ORDER — FENTANYL CITRATE (PF) 100 MCG/2ML IJ SOLN: 25.0000 ug | INTRAMUSCULAR | Status: DC | PRN

## 2019-09-04 MED ORDER — PHENYLEPHRINE HCL (PRESSORS) 10 MG/ML IV SOLN
INTRAVENOUS | Status: DC | PRN
  Administered 2019-09-04: 80 ug via INTRAVENOUS
  Administered 2019-09-04: 120 ug via INTRAVENOUS
  Administered 2019-09-04 (×2): 40 ug via INTRAVENOUS

## 2019-09-04 MED ORDER — LIDOCAINE-EPINEPHRINE (PF) 1 %-1:200000 IJ SOLN
INTRAMUSCULAR | Status: AC
  Filled 2019-09-04: qty 30

## 2019-09-04 MED ORDER — ONDANSETRON HCL 4 MG/2ML IJ SOLN
INTRAMUSCULAR | Status: AC
  Filled 2019-09-04: qty 2

## 2019-09-04 MED ORDER — VANCOMYCIN HCL IN DEXTROSE 1-5 GM/200ML-% IV SOLN
1000.0000 mg | INTRAVENOUS | Status: DC
  Administered 2019-09-04 – 2019-09-08 (×5): 1000 mg via INTRAVENOUS
  Filled 2019-09-04 (×5): qty 200

## 2019-09-04 MED ORDER — LIDOCAINE 2% (20 MG/ML) 5 ML SYRINGE
INTRAMUSCULAR | Status: AC
  Filled 2019-09-04: qty 5

## 2019-09-04 MED ORDER — LABETALOL HCL 5 MG/ML IV SOLN
INTRAVENOUS | Status: AC
  Filled 2019-09-04: qty 4

## 2019-09-04 MED ORDER — POTASSIUM CHLORIDE 10 MEQ/100ML IV SOLN: 10.0000 meq | INTRAVENOUS | Status: AC

## 2019-09-04 MED ORDER — BACITRACIN ZINC 500 UNIT/GM EX OINT
TOPICAL_OINTMENT | CUTANEOUS | Status: AC
  Filled 2019-09-04: qty 28.35

## 2019-09-04 MED ORDER — LIDOCAINE HCL (CARDIAC) PF 100 MG/5ML IV SOSY
PREFILLED_SYRINGE | INTRAVENOUS | Status: DC | PRN
  Administered 2019-09-04: 40 mg via INTRAVENOUS

## 2019-09-04 MED ORDER — LABETALOL HCL 5 MG/ML IV SOLN
5.0000 mg | Freq: Once | INTRAVENOUS | Status: AC
  Administered 2019-09-04: 5 mg via INTRAVENOUS

## 2019-09-04 MED ORDER — POTASSIUM CHLORIDE 10 MEQ/100ML IV SOLN
10.0000 meq | INTRAVENOUS | Status: AC
  Administered 2019-09-04 (×4): 10 meq via INTRAVENOUS
  Filled 2019-09-04 (×2): qty 100

## 2019-09-04 MED ORDER — 0.9 % SODIUM CHLORIDE (POUR BTL) OPTIME
TOPICAL | Status: DC | PRN
  Administered 2019-09-04: 1000 mL

## 2019-09-04 MED ORDER — LACTATED RINGERS IV SOLN
INTRAVENOUS | Status: DC | PRN
  Administered 2019-09-04: 11:00:00 via INTRAVENOUS

## 2019-09-04 MED ORDER — PROPOFOL 500 MG/50ML IV EMUL
INTRAVENOUS | Status: DC | PRN
  Administered 2019-09-04: 100 mg via INTRAVENOUS

## 2019-09-04 MED ORDER — PHENYLEPHRINE 40 MCG/ML (10ML) SYRINGE FOR IV PUSH (FOR BLOOD PRESSURE SUPPORT)
PREFILLED_SYRINGE | INTRAVENOUS | Status: AC
  Filled 2019-09-04: qty 10

## 2019-09-04 SURGICAL SUPPLY — 28 items
ATTRACTOMAT 16X20 MAGNETIC DRP (DRAPES) ×3 IMPLANT
BNDG GAUZE ELAST 4 BULKY (GAUZE/BANDAGES/DRESSINGS) ×2 IMPLANT
CLEANER TIP ELECTROSURG 2X2 (MISCELLANEOUS) ×1 IMPLANT
CORD BIPOLAR FORCEPS 12FT (ELECTRODE) ×3 IMPLANT
COVER SURGICAL LIGHT HANDLE (MISCELLANEOUS) ×3 IMPLANT
COVER WAND RF STERILE (DRAPES) IMPLANT
ELECT COATED BLADE 2.86 ST (ELECTRODE) ×3 IMPLANT
ELECT REM PT RETURN 15FT ADLT (MISCELLANEOUS) ×3 IMPLANT
GAUZE SPONGE 4X4 12PLY STRL (GAUZE/BANDAGES/DRESSINGS) ×2 IMPLANT
GLOVE BIOGEL M 7.0 STRL (GLOVE) ×3 IMPLANT
KIT BASIN OR (CUSTOM PROCEDURE TRAY) ×3 IMPLANT
KIT TURNOVER KIT A (KITS) ×2 IMPLANT
NEEDLE HYPO 22GX1.5 SAFETY (NEEDLE) ×2 IMPLANT
NS IRRIG 1000ML POUR BTL (IV SOLUTION) ×1 IMPLANT
PACK EENT SPLIT (PACKS) ×3 IMPLANT
PAD TELFA 2X3 NADH STRL (GAUZE/BANDAGES/DRESSINGS) ×2 IMPLANT
PENCIL SMOKE EVACUATOR (MISCELLANEOUS) ×2 IMPLANT
STAPLER VISISTAT 35W (STAPLE) ×1 IMPLANT
SUT ETHILON 3 0 PS 1 (SUTURE) ×2 IMPLANT
SUT ETHILON 5 0 PS 2 18 (SUTURE) IMPLANT
SUT VIC AB 3-0 PS2 18 (SUTURE)
SUT VIC AB 3-0 PS2 18XBRD (SUTURE) IMPLANT
SWAB COLLECTION DEVICE MRSA (MISCELLANEOUS) ×2 IMPLANT
SWAB CULTURE ESWAB REG 1ML (MISCELLANEOUS) ×2 IMPLANT
SYR CONTROL 10ML LL (SYRINGE) ×2 IMPLANT
TAPE CLOTH SURG 4X10 WHT LF (GAUZE/BANDAGES/DRESSINGS) ×2 IMPLANT
TOWEL OR 17X26 10 PK STRL BLUE (TOWEL DISPOSABLE) ×2 IMPLANT
WATER STERILE IRR 1000ML POUR (IV SOLUTION) ×1 IMPLANT

## 2019-09-04 NOTE — Anesthesia Preprocedure Evaluation (Signed)
Anesthesia Evaluation  Patient identified by MRN, date of birth, ID band Patient awake    Reviewed: Allergy & Precautions, NPO status , Patient's Chart, lab work & pertinent test results  Airway Mallampati: II  TM Distance: >3 FB Neck ROM: Full    Dental no notable dental hx.    Pulmonary neg pulmonary ROS,    Pulmonary exam normal breath sounds clear to auscultation       Cardiovascular hypertension, Normal cardiovascular exam Rhythm:Regular Rate:Normal     Neuro/Psych PSYCHIATRIC DISORDERS dementianegative neurological ROS     GI/Hepatic negative GI ROS, Neg liver ROS,   Endo/Other  negative endocrine ROS  Renal/GU negative Renal ROS  negative genitourinary   Musculoskeletal negative musculoskeletal ROS (+)   Abdominal   Peds negative pediatric ROS (+)  Hematology negative hematology ROS (+)   Anesthesia Other Findings Neck abscess  Reproductive/Obstetrics negative OB ROS                             Anesthesia Physical Anesthesia Plan  ASA: III  Anesthesia Plan: General   Post-op Pain Management:    Induction: Intravenous  PONV Risk Score and Plan: 2 and Ondansetron and Treatment may vary due to age or medical condition  Airway Management Planned: Oral ETT  Additional Equipment: None  Intra-op Plan:   Post-operative Plan: Extubation in OR  Informed Consent: I have reviewed the patients History and Physical, chart, labs and discussed the procedure including the risks, benefits and alternatives for the proposed anesthesia with the patient or authorized representative who has indicated his/her understanding and acceptance.     Dental advisory given and Consent reviewed with POA  Plan Discussed with: CRNA  Anesthesia Plan Comments:         Anesthesia Quick Evaluation

## 2019-09-04 NOTE — Progress Notes (Signed)
Pharmacy Antibiotic Note  Kristopher Evans is a 84 y.o. male admitted on 09/02/2019 with cellulitis of the neck.  Pharmacy was consulted for Vancomycin, cefepime dosing.  Plan: Vancomycin 1gm x1, then 1gm q24, AUC 538, using TBW, using SCr of 1 Rocephin 2gm q24  Height: 5\' 6"  (167.6 cm) Weight: 129 lb 13.6 oz (58.9 kg) IBW/kg (Calculated) : 63.8  Temp (24hrs), Avg:98.2 F (36.8 C), Min:97.9 F (36.6 C), Max:98.4 F (36.9 C)  Recent Labs  Lab 09/02/19 1642 09/03/19 0346 09/04/19 0300  WBC 18.7* 18.2* 17.9*  CREATININE 1.00 0.85 0.92    Estimated Creatinine Clearance: 51.6 mL/min (by C-G formula based on SCr of 0.92 mg/dL).    Allergies  Allergen Reactions  . Penicillins     Not listed on MAR   Antimicrobials this admission: 11/18 Cefepime >> 11/19 11/18 Vancomycin  >> 11/19 Rocephin >>  Dose adjustments this admission: Vanc 1gm x1, then 1250mg  q24, AUC 542, SCr 0.85  Microbiology results: 11/18 BCx: ngtd 11/18 MRSA PCR: positive 11/20 AbscessCx: collected  Thank you for allowing pharmacy to be a part of this patient's care.  Minda Ditto PharmD 09/04/2019 12:00 PM

## 2019-09-04 NOTE — Anesthesia Postprocedure Evaluation (Signed)
Anesthesia Post Note  Patient: Kristopher Evans  Procedure(s) Performed: IRRIGATION AND DEBRIDEMENT OF NECK ABCESS (Left Neck)     Patient location during evaluation: PACU Anesthesia Type: General Level of consciousness: awake and alert, oriented and patient cooperative Pain management: pain level controlled Vital Signs Assessment: post-procedure vital signs reviewed and stable Respiratory status: spontaneous breathing, nonlabored ventilation and respiratory function stable Cardiovascular status: blood pressure returned to baseline and stable Postop Assessment: no apparent nausea or vomiting Anesthetic complications: no Comments: Hypertensive at baseline; given labetalol 5mg  x 1 dose in PACU     Last Vitals:  Vitals:   09/04/19 1230 09/04/19 1242  BP: (!) 148/91 (!) 178/79  Pulse: 73 79  Resp: 19 12  Temp: 36.8 C 37.3 C  SpO2: 93% 100%    Last Pain:  Vitals:   09/04/19 1242  TempSrc: Oral  PainSc:                  Pervis Hocking

## 2019-09-04 NOTE — Care Management Important Message (Signed)
Important Message  Patient Details IM Letter given to Sharren Bridge SW to present to the Patient Name: Kristopher Evans MRN: [id number] Date of Birth: 1935/01/12   Medicare Important Message Given:  Yes     Kerin Salen 09/04/2019, 10:14 AM

## 2019-09-04 NOTE — Progress Notes (Signed)
   ENT Progress Note: Procedure(s): IRRIGATION AND DEBRIDEMENT OF NECK ABCESS   Subjective: No change  Objective: Vital signs in last 24 hours: Temp:  [97.9 F (36.6 C)-98.2 F (36.8 C)] 98.2 F (36.8 C) (11/19 2155) Pulse Rate:  [70-93] 72 (11/20 0631) Resp:  [18] 18 (11/20 0631) BP: (139-165)/(80-94) 161/82 (11/20 0631) SpO2:  [97 %-98 %] 97 % (11/20 0631) Weight change:     Intake/Output from previous day: 11/19 0701 - 11/20 0700 In: 2621.6 [P.O.:660; I.V.:1574.8; IV Piggyback:386.7] Out: 2400 [Urine:2400] Intake/Output this shift: No intake/output data recorded.  Labs: Recent Labs    09/03/19 0346 09/04/19 0300  WBC 18.2* 17.9*  HGB 13.0 12.9*  HCT 40.6 40.4  PLT 271 286   Recent Labs    09/03/19 0346 09/04/19 0300  NA 134* 139  K 3.6 3.3*  CL 99 105  CO2 24 23  GLUCOSE 125* 114*  BUN 16 14  CALCIUM 8.2* 8.0*    Studies/Results: Ct Soft Tissue Neck W Contrast  Result Date: 09/02/2019 CLINICAL DATA:  Abscess behind the left ear with drainage. Left lateral neck swelling. EXAM: CT NECK WITH CONTRAST TECHNIQUE: Multidetector CT imaging of the neck was performed using the standard protocol following the bolus administration of intravenous contrast. CONTRAST:  58mL OMNIPAQUE IOHEXOL 300 MG/ML  SOLN COMPARISON:  None. FINDINGS: Pharynx and larynx: No evidence of mass or swelling within limitations of motion artifact. Patent airway. No retropharyngeal fluid collection. Salivary glands: No inflammation, mass, or stone. Thyroid: Unremarkable. Lymph nodes: Mildly enlarged left supraclavicular and level V lymph nodes measuring 9-11 mm in short axis, likely reactive. Vascular: Aortic and mild carotid artery atherosclerosis. Limited intracranial: Unremarkable. Visualized orbits: Bilateral cataract extraction. Mastoids and visualized paranasal sinuses: Clear. Skeleton: Advanced disc space narrowing at C5-6, C6-7, and C7-T1. Severe neural foraminal stenosis due to  uncovertebral spurring on the right at C5-6 and on the left at C6-7. Upper chest: Clear lung apices. Other: 2.5 x 2.7 x 3.0 cm rim enhancing fluid collection in the left posterolateral neck centered in the subcutaneous/cutaneous tissues with overlying skin bulging, skin thickening, and subcutaneous inflammation. Inflammation extends into the underlying left posterior neck musculature without evidence of a separate deep cervical abscess. IMPRESSION: 1. 3 cm left neck abscess and surrounding cellulitis. 2. Mildly enlarged left neck lymph nodes, likely reactive. 3. Aortic Atherosclerosis (ICD10-I70.0). Electronically Signed   By: Logan Bores M.D.   On: 09/02/2019 19:22     PHYSICAL EXAM: Erythema and swelling Left post neck   Assessment/Plan: Pt for I&D left neck    Kristopher Evans 09/04/2019, 10:20 AM

## 2019-09-04 NOTE — Op Note (Signed)
Operative Note: INCISION & DRAINAGE NECK ABSCESS  Patient: Kristopher Evans  Medical record number: [id number]  Date:09/04/2019  Pre-operative Indications: Left neck abscess  Postoperative Indications: Same  Surgical Procedure: Incision and Drainage of left neck abscess  Anesthesia: GET  Surgeon: Delsa Bern, M.D.  Assist: None  Complications: None  EBL: None   Brief History: The patient is a 84 y.o. male with a history of swelling and pain in the left lateral neck, patient evaluated in the was a long hospital emergency department and admitted for intravenous antibiotic therapy.  CT scan of the neck showed 2 x 3 cm soft tissue abscess involving the left posterior lateral neck.  Given the patient's history and findings, I recommended incision and drainage of abscess under general anesthesia, risks and benefits were discussed in detail with the patient and their family. They understand and agree with our plan for surgery which is scheduled at [facility] on an elective basis.  Surgical Procedure: The patient is brought to the operating room on 09/04/2019 and placed in supine position on the operating table.  LMA general endotracheal anesthesia was established without difficulty. When the patient was adequately anesthetized, surgical timeout was performed and correct identification of the patient and the surgical procedure. The patient was positioned and prepped and draped in sterile fashion.  Patient's preoperative imaging was reviewed.  The patient was injected with 2 cc of 1% lidocaine 1-100,000 dilution epinephrine in the skin and soft tissue overlying the palpable abscess.  After allowing adequate time for vasoconstriction and hemostasis #15 scalpel was used to make an incision in the skin and underlying deep subcutaneous tissue.  This is carried through the soft tissue into the abscess cavity.  A significant amount of purulent material was expressed.  Cultures and  sensitivity for aerobic and anaerobic bacteria were obtained and sent to the laboratory.  The abscess cavity was then thoroughly irrigated with sterile saline solution.  The skin margin was erythematous and inflamed, biopsies from the superficial aspect of the wound as well as the deeper area were sent to pathology for gross microscopic evaluation to rule out any malignancy.  A Penrose drain was inserted to the depth of the abscess cavity and sutured to the skin with interrupted 3-0 Ethilon suture.  The patient's wound was then dressed with Telfa gauze, 4 x 4 gauze and a 4 inch Kerlix wrap.  An orogastric tube was passed and stomach contents were aspirated. Patient was awakened from anesthetic and transferred from the operating room to the recovery room in stable condition. There were no complications and blood loss was minimal.   Delsa Bern, M.D. [facility] 09/04/2019

## 2019-09-04 NOTE — Anesthesia Procedure Notes (Signed)
Procedure Name: LMA Insertion Date/Time: 09/04/2019 10:44 AM Performed by: [place] Saulo Anthis C, CRNA Pre-anesthesia Checklist: Patient identified, Emergency Drugs available, Suction available and Patient being monitored Patient Re-evaluated:Patient Re-evaluated prior to induction Oxygen Delivery Method: Circle system utilized Preoxygenation: Pre-oxygenation with 100% oxygen Induction Type: IV induction Ventilation: Mask ventilation without difficulty LMA: LMA inserted LMA Size: 4.0 Number of attempts: 1 Airway Equipment and Method: Bite block Placement Confirmation: positive ETCO2 Tube secured with: Tape Dental Injury: Teeth and Oropharynx as per pre-operative assessment

## 2019-09-04 NOTE — Progress Notes (Addendum)
PROGRESS NOTE    Kristopher Evans  ZOX:[id number] DOB: 1934-12-23 DOA: 09/02/2019 PCP: Patient, No Pcp Per   Brief Narrative:  HPI per [name] on 09/02/2019 Kristopher Evans is a 84 y.o. male with medical history significant of dementia who lives in a skilled facility brought in due to left neck swelling and mild fever.  This has been noted to be present for the last 3 days.  It is behind the left ear.  Patient was seen in the ER and evaluated.  Appears to have an abscess.  Incision and drainage was attempted in the ER.  Initial attempt was not very successful.  ENT was consulted and recommends admission with IV antibiotics.  Dr. Annalee Genta is willing to see the patient if not better for any further surgical intervention.  Patient is confused not able to give any adequate history due to his dementia.  History is therefore obtained mainly from nursing home records..  ED Course: Temperature is 99.2 blood pressure 196/84 pulse 108 respiratory of 18 oxygen sat 98% room air.  White count is 18.7 otherwise the rest of CBC and chemistry are within normal.  COVID-19 screen is currently pending.  CT neck showed 3 cm left neck abscess and surrounding cellulitis.  Also mildly enlarged left neck lymph nodes likely reactive.  I&D was done and patient is being admitted for further work-up  **Interim History Continude to have some induration and minimal drainage from his abscess site.  Consulted ENT for further evaluation recommendations and they recommending I and D in the OR which was done this AM.  We will continue IV fluids at this time and patient remains severely demented and confused.  Assessment & Plan:   Principal Problem:   Neck abscess Active Problems:   Dementia (HCC)   HTN (hypertension)   Leucocytosis   Pressure injury of skin  Left Neck Abscess and Cellulitis -CT Neck showed "3 cm left neck abscess and surrounding cellulitis.  Mildly enlarged left neck lymph nodes, likely reactive.  Aortic Atherosclerosis." -Status post I&D by EDP with minimal drainage -Admitted the patient.   -Started on IV Vancomycin and IV Cefepime but will continue Vancomycin and De-escalate Cefepime to IV Ceftriaxone and continue for now pending Sensitivities .   -Cultures have been obtained both blood cultures and mild wound cultures but I do not see that wound Cx's have been sent  -There is area still indurated and had minimal purulent drainage so ENT was consulted and they recommending I and D in the OR and this was done today  -Leukocytosis is still elevated patient WBC went from 18.7 -> 18.2 -> 17.9 -Of note patient is MRSA positive via PCR and SARS-CoV-2 was negative -C/w Pain control with Tramadol 50 mg po q6hprn and Supportive Care with Zofran 4 mg po/IV q6hprn   -Patient is in PACU now after I and D; See [name] operative note  Dementia -Patient has agitation yesterday morning he was wearing soft mitten restraints. Severely confused today  -Given Trazodone but appears to have more reaction to it -Given Haldol 2.5 mg IV the day before yesterday  -Continue to monitor and may need a sitter  Hypertension -Blood pressure is markedly elevated but no has no documented history of hypertension.   -Likely in the setting of Infection -Will monitor and initiate treatment with prn 25 mg po q6hprn Hydralazine for SBP>160 or DBP>90 -BP this AM was 146/87  Leukocytosis -Secondary to abscess -Mild and is trending down slightly -  WBC went from 18.7 -> 18.2 -> 17.9 -C/w Abx as Above -Continue to Monitor and Trend and Repeat CBC in AM   Hyponatremia -Mild as Na+ was 134 and now improved to 139 -C/w NS at 75 mL/hr for now -Continue to Monitor and Trend -Repeat CMP in AM   Hyperglycemia -Patient's Blood Sugar on Admission was elevated at 106 and repeat this AM was 125 -Check HbA1c in the AM  -Continue to Monitor Blood Sugars Carefully and if necessary will place on Sensitive Novolog SSI   Hypokalemia -Patient's potassium this morning was 3.3 -Replete with IV potassium chloride 40 mEq as patient was n.p.o. for an I&D -Continue to monitor and replete as necessary -Repeat CMP in a.m.  Normocytic Anemia -Likely dilutional drop -Patient hemoglobin/hematocrit went from 13.7/44.0 on admission is now 12.9/40.4 -Check anemia panel in the a.m. -Continue to monitor for signs and symptoms of bleeding; currently no overt bleeding noted -Repeat CBC in the a.m.  DVT prophylaxis: Enoxaparin 40 mg sq q24h Code Status: FULL CODE Family Communication: No family at bedside  Disposition Plan: Anticipate D/C Back to SNF when improved and Abscess and Cellulitis is resolved   Consultants:   ENT Dr. Annalee Genta   Procedures: I and D Done by EDP    Antimicrobials:  Anti-infectives (From admission, onward)   Start     Dose/Rate Route Frequency Ordered Stop   09/03/19 2200  [MAR Hold]  cefTRIAXone (ROCEPHIN) 2 g in sodium chloride 0.9 % 100 mL IVPB     (MAR Hold since Fri 09/04/2019 at 1021.Hold Reason: Transfer to a Procedural area.)   2 g 200 mL/hr over 30 Minutes Intravenous Every 24 hours 09/03/19 1125     09/03/19 1800  [MAR Hold]  vancomycin (VANCOCIN) 1,250 mg in sodium chloride 0.9 % 250 mL IVPB     (MAR Hold since Fri 09/04/2019 at 1021.Hold Reason: Transfer to a Procedural area.)   1,250 mg 166.7 mL/hr over 90 Minutes Intravenous Every 24 hours 09/03/19 0546     09/03/19 1000  ceFEPIme (MAXIPIME) 2 g in sodium chloride 0.9 % 100 mL IVPB  Status:  Discontinued     2 g 200 mL/hr over 30 Minutes Intravenous Every 12 hours 09/03/19 0546 09/03/19 1125   09/02/19 2145  ceFEPIme (MAXIPIME) 2 g in sodium chloride 0.9 % 100 mL IVPB     2 g 200 mL/hr over 30 Minutes Intravenous  Once 09/02/19 2143 09/03/19 0239   09/02/19 2130  vancomycin (VANCOCIN) IVPB 1000 mg/200 mL premix     1,000 mg 200 mL/hr over 60 Minutes Intravenous  Once 09/02/19 2122 09/03/19 0004     Subjective: Seen  and examined at bedside prior to his incision and drainage in the OR and he remained confused as was muttering.  Unable to provide a subjective history to me.  Not wearing his soft mitten restraints today.  No other concerns or complaints at this time and nursing states no acute changes.  Objective: Vitals:   09/03/19 2155 09/04/19 0631 09/04/19 1127 09/04/19 1130  BP: (!) 165/80 (!) 161/82 134/86 (!) 146/87  Pulse: 93 72 86 80  Resp: Temp: 98.2 F (36.8 C)  98.4 F (36.9 C)   TempSrc: Oral     SpO2: 98% 97% 100% 100%  Weight:      Height:        Intake/Output Summary (Last 24 hours) at 09/04/2019 1140 Last data filed at 09/04/2019 1131 Gross per 24 hour  Intake 2854.69 ml  Output 2405 ml  Net 449.69 ml   Filed Weights   09/03/19 0528  Weight: 58.9 kg   Examination: Physical Exam:  Constitutional: Thin elderly Caucasian male who is demented and in no acute distress appears calm today and is muttering and incomprehensible Eyes: Lids and conjunctivae normal, sclerae anicteric  ENMT: External Ears, Nose appear normal. Grossly normal hearing.  Neck: Has a large abscess and cellulitic area on the left side of his neck with some induration and some mild purulent drainage Respiratory: Slightly diminished to auscultation bilaterally, no wheezing, rales, rhonchi or crackles. Normal respiratory effort and patient is not tachypenic. No accessory muscle use.  Unlabored breathing Cardiovascular: RRR, no murmurs / rubs / gallops. S1 and S2 auscultated. No extremity edema.  Abdomen: Soft, non-tender, non-distended.  Has a large inguinal hernia on the right side that does not seem incarcerated; bowel sounds positive.  GU: Deferred. Musculoskeletal: No clubbing / cyanosis of digits/nails. No joint deformity upper and lower extremities. Skin: Has a cellulitic and indurated on the left side of his neck with a quarter sized lesion that is raised and has mild purulence.  Has  plaque-like lesions on his right leg and around his umbilicus.  Did not turn to see if he is any sacral decubitus but nursing reported two small coccyx pressure wounds one is a Stage 1 and the other is a Stage 2 Neurologic: CN 2-12 grossly intact with no focal deficits. Romberg sign and cerebellar reflexes not assessed.  Psychiatric: Impaired judgment and insight.  He is not alert and oriented x 3.   Data Reviewed: I have personally reviewed following labs and imaging studies  CBC: Recent Labs  Lab 09/02/19 1642 09/03/19 0346 09/04/19 0300  WBC 18.7* 18.2* 17.9*  NEUTROABS 12.2*  --  13.5*  HGB 13.7 13.0 12.9*  HCT 44.0 40.6 40.4  MCV 98.2 95.1 95.1  PLT 319 271 286   Basic Metabolic Panel: Recent Labs  Lab 09/02/19 1642 09/03/19 0346 09/04/19 0300  NA 140 134* 139  K 3.9 3.6 3.3*  CL 103 99 105  CO2 25 24 23   GLUCOSE 106* 125* 114*  BUN 20 16 14   CREATININE 1.00 0.85 0.92  CALCIUM 8.7* 8.2* 8.0*  MG  --   --  2.0  PHOS  --   --  2.7   GFR: Estimated Creatinine Clearance: 51.6 mL/min (by C-G formula based on SCr of 0.92 mg/dL). Liver Function Tests: Recent Labs  Lab 09/03/19 0346 09/04/19 0300  AST 33 29  ALT 25 23  ALKPHOS 99 89  BILITOT 1.2 0.9  PROT 6.6 6.5  ALBUMIN 3.2* 3.0*   No results for input(s): LIPASE, AMYLASE in the last 168 hours. No results for input(s): AMMONIA in the last 168 hours. Coagulation Profile: No results for input(s): INR, PROTIME in the last 168 hours. Cardiac Enzymes: No results for input(s): CKTOTAL, CKMB, CKMBINDEX, TROPONINI in the last 168 hours. BNP (last 3 results) No results for input(s): PROBNP in the last 8760 hours. HbA1C: No results for input(s): HGBA1C in the last 72 hours. CBG: No results for input(s): GLUCAP in the last 168 hours. Lipid Profile: No results for input(s): CHOL, HDL, LDLCALC, TRIG, CHOLHDL, LDLDIRECT in the last 72 hours. Thyroid Function Tests: No results for input(s): TSH, T4TOTAL, FREET4,  T3FREE, THYROIDAB in the last 72 hours. Anemia Panel: No results for input(s): VITAMINB12, FOLATE, FERRITIN, TIBC, IRON, RETICCTPCT in the last 72 hours. Sepsis Labs: No results for  input(s): PROCALCITON, LATICACIDVEN in the last 168 hours.  Recent Results (from the past 240 hour(s))  Blood culture (routine x 2)     Status: None (Preliminary result)   Collection Time: 09/02/19  9:13 PM   Specimen: BLOOD  Result Value Ref Range Status   Specimen Description   Final    BLOOD LEFT ANTECUBITAL Performed at [facility], [address], [place], [place] [address]    Special Requests   Final    BOTTLES DRAWN AEROBIC AND ANAEROBIC Blood Culture results may not be optimal due to an inadequate volume of blood received in culture bottles Performed at [facility], [address], [place], [place] [address]    Culture   Final    NO GROWTH 2 DAYS Performed at [facility], [address], [place], [place] [address]    Report Status PENDING  Incomplete  Blood culture (routine x 2)     Status: None (Preliminary result)   Collection Time: 09/02/19  9:13 PM   Specimen: BLOOD  Result Value Ref Range Status   Specimen Description   Final    BLOOD RIGHT ANTECUBITAL Performed at [facility], [address] [place], [place] [address]    Special Requests   Final    BOTTLES DRAWN AEROBIC AND ANAEROBIC Blood Culture adequate volume Performed at [facility], [address] [place], [place] [address]    Culture   Final    NO GROWTH 2 DAYS Performed at [facility], [address], [place], [place] [address]    Report Status PENDING  Incomplete  SARS CORONAVIRUS 2 (TAT 6-24 HRS) Nasopharyngeal Nasopharyngeal Swab     Status: None   Collection Time: 09/02/19  9:13 PM   Specimen: Nasopharyngeal Swab  Result Value Ref Range Status   SARS Coronavirus 2 NEGATIVE NEGATIVE Final    Comment: (NOTE) SARS-CoV-2  target nucleic acids are NOT DETECTED. The SARS-CoV-2 RNA is generally detectable in upper and lower respiratory specimens during the acute phase of infection. Negative results do not preclude SARS-CoV-2 infection, do not rule out co-infections with other pathogens, and should not be used as the sole basis for treatment or other patient management decisions. Negative results must be combined with clinical observations, patient history, and epidemiological information. The expected result is Negative. Fact Sheet for Patients: HairSlick.no Fact Sheet for Healthcare Providers: quierodirigir.com This test is not yet approved or cleared by the [place] FDA and  has been authorized for detection and/or diagnosis of SARS-CoV-2 by FDA under an Emergency Use Authorization (EUA). This EUA will remain  in effect (meaning this test can be used) for the duration of the COVID-19 declaration under Section 56 4(b)(1) of the Act, 21 U.S.C. section 360bbb-3(b)(1), unless the authorization is terminated or revoked sooner. Performed at [facility], [address], [place], [place] [address]   MRSA PCR Screening     Status: Abnormal   Collection Time: 09/02/19 10:57 PM   Specimen: Nasal Mucosa; Nasopharyngeal  Result Value Ref Range Status   MRSA by PCR POSITIVE (A) NEGATIVE Final    Comment:        The GeneXpert MRSA Assay (FDA approved for NASAL specimens only), is one component of a comprehensive MRSA colonization surveillance program. It is not intended to diagnose MRSA infection nor to guide or monitor treatment for MRSA infections. CRITICAL RESULT CALLED TO, READ BACK BY AND VERIFIED WITH: RN V[place] AT  3154 09/03/19 CRUICKSHANK A Performed at [facility], [place] [address], [place],  [address]     Radiology Studies: Ct Soft Tissue Neck W Contrast  Result Date: 09/02/2019 CLINICAL DATA:  Abscess  behind the left ear with drainage. Left lateral neck swelling. EXAM: CT NECK WITH CONTRAST TECHNIQUE: Multidetector CT imaging of the neck was performed using the standard protocol following the bolus administration of intravenous contrast. CONTRAST:  56mL OMNIPAQUE IOHEXOL 300 MG/ML  SOLN COMPARISON:  None. FINDINGS: Pharynx and larynx: No evidence of mass or swelling within limitations of motion artifact. Patent airway. No retropharyngeal fluid collection. Salivary glands: No inflammation, mass, or stone. Thyroid: Unremarkable. Lymph nodes: Mildly enlarged left supraclavicular and level V lymph nodes measuring 9-11 mm in short axis, likely reactive. Vascular: Aortic and mild carotid artery atherosclerosis. Limited intracranial: Unremarkable. Visualized orbits: Bilateral cataract extraction. Mastoids and visualized paranasal sinuses: Clear. Skeleton: Advanced disc space narrowing at C5-6, C6-7, and C7-T1. Severe neural foraminal stenosis due to uncovertebral spurring on the right at C5-6 and on the left at C6-7. Upper chest: Clear lung apices. Other: 2.5 x 2.7 x 3.0 cm rim enhancing fluid collection in the left posterolateral neck centered in the subcutaneous/cutaneous tissues with overlying skin bulging, skin thickening, and subcutaneous inflammation. Inflammation extends into the underlying left posterior neck musculature without evidence of a separate deep cervical abscess. IMPRESSION: 1. 3 cm left neck abscess and surrounding cellulitis. 2. Mildly enlarged left neck lymph nodes, likely reactive. 3. Aortic Atherosclerosis (ICD10-I70.0). Electronically Signed   By: Logan Bores M.D.   On: 09/02/2019 19:22   Scheduled Meds: . [MAR Hold] aspirin EC  81 mg Oral Daily  . [MAR Hold] Chlorhexidine Gluconate Cloth  6 each Topical Q0600  . [MAR Hold] cholecalciferol  2,000 Units Oral Daily  . [MAR Hold] enoxaparin (LOVENOX) injection  40 mg Subcutaneous QHS  . [MAR Hold] mupirocin ointment  1 application Nasal BID   . [MAR Hold] traZODone  50 mg Oral QHS  . [MAR Hold] vitamin B-12  1,000 mcg Oral Daily   Continuous Infusions: . sodium chloride 75 mL/hr at 09/03/19 1448  . [MAR Hold] cefTRIAXone (ROCEPHIN)  IV 2 g (09/03/19 2147)  . [MAR Hold] potassium chloride    . [MAR Hold] vancomycin 1,250 mg (09/03/19 1729)    LOS: 2 days   Kerney Elbe, DO [facility] PAGER is on [place]  If 7PM-7AM, please contact night-coverage [email or web address]

## 2019-09-04 NOTE — Transfer of Care (Signed)
Immediate Anesthesia Transfer of Care Note  Patient: Kristopher Evans  Procedure(s) Performed: IRRIGATION AND DEBRIDEMENT OF NECK ABCESS (Left Neck)  Patient Location: PACU  Anesthesia Type:General  Level of Consciousness: drowsy and patient cooperative  Airway & Oxygen Therapy: Patient Spontanous Breathing and Patient connected to face mask oxygen  Post-op Assessment: Report given to RN, Post -op Vital signs reviewed and stable and Patient moving all extremities  Post vital signs: Reviewed and stable  Last Vitals:  Vitals Value Taken Time  BP 146/87 09/04/19 1130  Temp    Pulse 84 09/04/19 1130  Resp 16 09/04/19 1130  SpO2 100 % 09/04/19 1130  Vitals shown include unvalidated device data.  Last Pain:  Vitals:   09/04/19 0850  TempSrc:   PainSc: Asleep      Patients Stated Pain Goal: Other (Comment) (20/25/42 7062)  Complications: No apparent anesthesia complications

## 2019-09-05 ENCOUNTER — Inpatient Hospital Stay (HOSPITAL_COMMUNITY): Payer: PPO

## 2019-09-05 DIAGNOSIS — J9601 Acute respiratory failure with hypoxia: Secondary | ICD-10-CM

## 2019-09-05 LAB — RETICULOCYTES
Immature Retic Fract: 13.6 % (ref 2.3–15.9)
RBC.: 4.64 MIL/uL (ref 4.22–5.81)
Retic Count, Absolute: 59.9 10*3/uL (ref 19.0–186.0)
Retic Ct Pct: 1.3 % (ref 0.4–3.1)

## 2019-09-05 LAB — COMPREHENSIVE METABOLIC PANEL
ALT: 22 U/L (ref 0–44)
AST: 26 U/L (ref 15–41)
Albumin: 2.9 g/dL — ABNORMAL LOW (ref 3.5–5.0)
Alkaline Phosphatase: 99 U/L (ref 38–126)
Anion gap: 9 (ref 5–15)
BUN: 13 mg/dL (ref 8–23)
CO2: 26 mmol/L (ref 22–32)
Calcium: 8.5 mg/dL — ABNORMAL LOW (ref 8.9–10.3)
Chloride: 104 mmol/L (ref 98–111)
Creatinine, Ser: 0.85 mg/dL (ref 0.61–1.24)
GFR calc Af Amer: >60 mL/min (ref >60)
GFR calc non Af Amer: >60 mL/min (ref >60)
Glucose, Bld: 109 mg/dL — ABNORMAL HIGH (ref 70–99)
Potassium: 3.9 mmol/L (ref 3.5–5.1)
Sodium: 139 mmol/L (ref 135–145)
Total Bilirubin: 0.6 mg/dL (ref 0.3–1.2)
Total Protein: 6.7 g/dL (ref 6.5–8.1)

## 2019-09-05 LAB — CBC WITH DIFFERENTIAL/PLATELET
Abs Immature Granulocytes: 0.26 10*3/uL — ABNORMAL HIGH (ref 0.00–0.07)
Basophils Absolute: 0.2 10*3/uL — ABNORMAL HIGH (ref 0.0–0.1)
Basophils Relative: 1 %
Eosinophils Absolute: 0.1 10*3/uL (ref 0.0–0.5)
Eosinophils Relative: 1 %
HCT: 44.5 % (ref 39.0–52.0)
Hemoglobin: 13.9 g/dL (ref 13.0–17.0)
Immature Granulocytes: 1 %
Lymphocytes Relative: 28 %
Lymphs Abs: 5.8 10*3/uL — ABNORMAL HIGH (ref 0.7–4.0)
MCH: 30 pg (ref 26.0–34.0)
MCHC: 31.2 g/dL (ref 30.0–36.0)
MCV: 95.9 fL (ref 80.0–100.0)
Monocytes Absolute: 1.5 10*3/uL — ABNORMAL HIGH (ref 0.1–1.0)
Monocytes Relative: 7 %
Neutro Abs: 12.8 10*3/uL — ABNORMAL HIGH (ref 1.7–7.7)
Neutrophils Relative %: 62 %
Platelets: 321 10*3/uL (ref 150–400)
RBC: 4.64 MIL/uL (ref 4.22–5.81)
RDW: 14.3 % (ref 11.5–15.5)
WBC: 20.6 10*3/uL — ABNORMAL HIGH (ref 4.0–10.5)
nRBC: 0 % (ref 0.0–0.2)

## 2019-09-05 LAB — HEMOGLOBIN A1C
Hgb A1c MFr Bld: 5.2 % (ref 4.8–5.6)
Mean Plasma Glucose: 102.54 mg/dL

## 2019-09-05 LAB — IRON AND TIBC
Iron: 33 ug/dL — ABNORMAL LOW (ref 45–182)
Saturation Ratios: 18 % (ref 17.9–39.5)
TIBC: 180 ug/dL — ABNORMAL LOW (ref 250–450)
UIBC: 147 ug/dL

## 2019-09-05 LAB — VITAMIN B12: Vitamin B-12: 730 pg/mL (ref 180–914)

## 2019-09-05 LAB — MAGNESIUM: Magnesium: 2.1 mg/dL (ref 1.7–2.4)

## 2019-09-05 LAB — FERRITIN: Ferritin: 191 ng/mL (ref 24–336)

## 2019-09-05 LAB — FOLATE: Folate: 9.5 ng/mL (ref >5.9)

## 2019-09-05 LAB — PHOSPHORUS: Phosphorus: 2.2 mg/dL — ABNORMAL LOW (ref 2.5–4.6)

## 2019-09-05 MED ORDER — K PHOS MONO-SOD PHOS DI & MONO 155-852-130 MG PO TABS
500.0000 mg | ORAL_TABLET | Freq: Two times a day (BID) | ORAL | Status: AC
  Administered 2019-09-05 (×2): 500 mg via ORAL
  Filled 2019-09-05 (×2): qty 2

## 2019-09-05 MED ORDER — FUROSEMIDE 10 MG/ML IJ SOLN
40.0000 mg | Freq: Once | INTRAMUSCULAR | Status: AC
  Administered 2019-09-05: 40 mg via INTRAVENOUS
  Filled 2019-09-05: qty 4

## 2019-09-05 MED ORDER — LABETALOL HCL 5 MG/ML IV SOLN
10.0000 mg | INTRAVENOUS | Status: DC | PRN
  Administered 2019-09-05 – 2019-09-07 (×2): 10 mg via INTRAVENOUS
  Filled 2019-09-05 (×4): qty 4

## 2019-09-05 MED ORDER — POLYSACCHARIDE IRON COMPLEX 150 MG PO CAPS
150.0000 mg | ORAL_CAPSULE | Freq: Every day | ORAL | Status: DC
  Administered 2019-09-05 – 2019-09-10 (×6): 150 mg via ORAL
  Filled 2019-09-05 (×6): qty 1

## 2019-09-05 MED ORDER — GUAIFENESIN ER 600 MG PO TB12
600.0000 mg | ORAL_TABLET | Freq: Two times a day (BID) | ORAL | Status: DC | PRN
  Administered 2019-09-05: 600 mg via ORAL
  Filled 2019-09-05: qty 1

## 2019-09-05 NOTE — Progress Notes (Signed)
PROGRESS NOTE    Kristopher Evans  WUJ:[id number] DOB: 06/13/1935 DOA: 09/02/2019 PCP: Patient, No Pcp Per   Brief Narrative:  HPI per Dr. Earlie LouMohammad Garba on 09/02/2019 Kristopher Evans is a 84 y.o. male with medical history significant of dementia who lives in a skilled facility brought in due to left neck swelling and mild fever.  This has been noted to be present for the last 3 days.  It is behind the left ear.  Patient was seen in the ER and evaluated.  Appears to have an abscess.  Incision and drainage was attempted in the ER.  Initial attempt was not very successful.  ENT was consulted and recommends admission with IV antibiotics.  Dr. Annalee GentaShoemaker is willing to see the patient if not better for any further surgical intervention.  Patient is confused not able to give any adequate history due to his dementia.  History is therefore obtained mainly from nursing home records..  ED Course: Temperature is 99.2 blood pressure 196/84 pulse 108 respiratory of 18 oxygen sat 98% room air.  White count is 18.7 otherwise the rest of CBC and chemistry are within normal.  COVID-19 screen is currently pending.  CT neck showed 3 cm left neck abscess and surrounding cellulitis.  Also mildly enlarged left neck lymph nodes likely reactive.  I&D was done and patient is being admitted for further work-up  **Interim History Continued to have some induration and minimal drainage from his abscess site so Consulted ENT for further evaluation recommendations and they recommended I and D in the OR which was done yesterday AM.  We will discontinue IV fluids at this time and obtain CXR as patient is now on 2 Liters and sounds rhonchus. Patient remains severely demented and was somnolent on my examination  Assessment & Plan:   Principal Problem:   Neck abscess Active Problems:   Dementia (HCC)   HTN (hypertension)   Leucocytosis   Pressure injury of skin   Hypokalemia   Normocytic anemia   Acute respiratory failure with  hypoxia (HCC)   Hypophosphatemia  Left Neck Abscess and Cellulitis s/p I&D in the OR POD 1 by Dr. Annalee GentaShoemaker  -CT Neck showed "3 cm left neck abscess and surrounding cellulitis.  Mildly enlarged left neck lymph nodes, likely reactive. Aortic Atherosclerosis." -Status post I&D by EDP with minimal drainage so was taken to the OR -Admitted the patient.   -Started on IV Vancomycin and IV Cefepime but will continue Vancomycin and De-escalate Cefepime to IV Ceftriaxone and continue for now pending Sensitivities .   -Cultures have been obtained; Blood Cx x2 show NGTD at 3 Days and Wound Gram Stain shows Rare Gram + Cocci with Sensitivities pending  -Neck area was still indurated and had minimal purulent drainage so ENT was consulted and they recommending I and D in the OR and this was done yesterday -Leukocytosis is still elevated patient WBC went from 18.7 -> 18.2 -> 17.9 -> 20.6 -Of note patient is MRSA positive via PCR and SARS-CoV-2 was negative -C/w Pain control with Tramadol 50 mg po q6hprn and Supportive Care with Zofran 4 mg po/IV q6hprn   -See Dr. Thurmon FairShoemaker's operative note  -Postoperatively he is now on 2 Liters -Will stop IVF   Acute Respiratory Failure with Hypoxia -Postoperatively -Now on 2 Liters with Saturation at 96 -Stop IVF; Strict I's and O's and Daily Weights; I's and O's are inaccurate -Will give 40 mg of IV Lasix -Check CXR -Get SLP evaluation to ensure  patient is not asprirating -Aspiration Precautions   Dementia -Remains confused and today was somnolent -Given Trazodone but appears to have more reaction to it -Given Haldol 2.5 mg IV during this Admission  -Continue to monitor and may need a sitter   Hypertension -Blood pressure is markedly elevated but no has no documented history of hypertension.   -Likely in the setting of Infection -Will monitor and initiate treatment with prn 25 mg po q6hprn Hydralazine for SBP>160 or DBP>90 -BP this AM was 174/96   Leukocytosis -Secondary to abscess -Mild and was trending down slightly but had an acute bump -WBC went from 18.7 -> 18.2 -> 17.9 -> 20.6 -C/w Abx as Above until Cx results  -Continue to Monitor and Trend and Repeat CBC in AM   Hyponatremia -Mild as Na+ was 134 and now improved to 139 -Discontinued NS at 75 mL/hr for now -Continue to Monitor and Trend -Repeat CMP in AM   Hyperglycemia -Patient's Blood Sugar on Admission was elevated at 106 and repeat this AM was 125 -Checkrf HbA1c and was 5.2 -Continue to Monitor Blood Sugars Carefully and if necessary will place on Sensitive Novolog SSI  Hypokalemia -Patient's potassium this morning was 3.9 -Continue to monitor and replete as necessary -Repeat CMP in a.m.  Normocytic Anemia -Likely dilutional drop -Patient hemoglobin/hematocrit went from 13.7/44.0 on admission is now 12.9/40.4 -Checked Anemia Panel and showed an Iron Level of 33, UIBC of 147, TIBC of 180, Saturation Ratios of 18, Ferritin Level of 191, Folate Level of 9.5 and Vitamin B12 Level of 730 -Will start Iron Polysaccharides 150 mg po Daily  -Continue to monitor for signs and symptoms of bleeding; currently no overt bleeding noted -Repeat CBC in the a.m.  Hypophosphatemia -Patient's Phos Level this AM was 2.2 -Replete with po K Phos Neutral 500 mg po BID x2 -Continue to Monitor and Replete as necessary  -Repeat Phos Level in the AM  DVT prophylaxis: Enoxaparin 40 mg sq q24h Code Status: FULL CODE Family Communication: No family at bedside  Disposition Plan: Anticipate D/C Back to SNF when improved and Abscess and Cellulitis is improving   Consultants:   ENT Dr. Annalee Genta   Procedures: I and D Done by EDP; Incision and Drainage of left neck abscess done by Dr. Annalee Genta on 09/04/2019   Antimicrobials:  Anti-infectives (From admission, onward)   Start     Dose/Rate Route Frequency Ordered Stop   09/04/19 1800  vancomycin (VANCOCIN) IVPB 1000 mg/200 mL  premix     1,000 mg 200 mL/hr over 60 Minutes Intravenous Every 24 hours 09/04/19 1159     09/03/19 2200  cefTRIAXone (ROCEPHIN) 2 g in sodium chloride 0.9 % 100 mL IVPB     2 g 200 mL/hr over 30 Minutes Intravenous Every 24 hours 09/03/19 1125     09/03/19 1800  vancomycin (VANCOCIN) 1,250 mg in sodium chloride 0.9 % 250 mL IVPB  Status:  Discontinued     1,250 mg 166.7 mL/hr over 90 Minutes Intravenous Every 24 hours 09/03/19 0546 09/04/19 1159   09/03/19 1000  ceFEPIme (MAXIPIME) 2 g in sodium chloride 0.9 % 100 mL IVPB  Status:  Discontinued     2 g 200 mL/hr over 30 Minutes Intravenous Every 12 hours 09/03/19 0546 09/03/19 1125   09/02/19 2145  ceFEPIme (MAXIPIME) 2 g in sodium chloride 0.9 % 100 mL IVPB     2 g 200 mL/hr over 30 Minutes Intravenous  Once 09/02/19 2143 09/03/19 0239   09/02/19  2130  vancomycin (VANCOCIN) IVPB 1000 mg/200 mL premix     1,000 mg 200 mL/hr over 60 Minutes Intravenous  Once 09/02/19 2122 09/03/19 0004     Subjective: Seen and examined at bedside and he was somnolent and drowsy and did not wake up during the encounter to physical and verbal stimuli. He was resting and now on Supplemental O2 with 2 Liters. Still wearing soft mitten restraints.   Objective: Vitals:   09/04/19 2118 09/04/19 2237 09/05/19 0618 09/05/19 0922  BP: (!) 180/166 (!) 152/72 (!) 199/92 (!) 174/96  Pulse: 85 85 92 92  Resp: 18  18 18   Temp: 98.5 F (36.9 C)  98.2 F (36.8 C)   TempSrc: Oral  Axillary   SpO2: 94%   97%  Weight:      Height:        Intake/Output Summary (Last 24 hours) at 09/05/2019 1052 Last data filed at 09/05/2019 0940 Gross per 24 hour  Intake 2014.02 ml  Output 2105 ml  Net -90.98 ml   Filed Weights   09/03/19 0528  Weight: 58.9 kg   Examination: Physical Exam:  Constitutional: Thin elderly Caucasian male who is drowsy and appears in NAD and appears calm and comfortable as he is sleeping an did not wake up for me during the exam Eyes:   Lids and conjunctivae normal, sclerae anicteric  ENMT: External Ears, Nose appear normal. Grossly normal hearing.  Neck: Neck was bandaged and did not remove to see Respiratory: Diminished to auscultation bilaterally with coarse breath sounds and bilateral scattered rhonchi. no wheezing, rales. Normal respiratory effort and patient is not tachypenic. No accessory muscle use but is mouth breathing and wearing 2 Liters of Supplemental O2 via [place] Cardiovascular: RRR, no murmurs / rubs / gallops. S1 and S2 auscultated. Trace extremity edema;  Abdomen: Soft, non-tender, non-distended. Has a plaque like lesion around his umbilicus and Bowel sounds positive. Has a large Right inguinal hernia GU: Deferred. Musculoskeletal: No clubbing / cyanosis of digits/nails. No joint deformity upper and lower extremities. Skin:  Has a plaque like lesion on Right Leg and umbilicus. Neck is wrapped.. No induration; Warm and dry.  Neurologic: CN 2-12 grossly intact with no focal deficits. Romberg sign and cerebellar reflexes not assessed.  Psychiatric: Impaired judgment and insight. Not Alert and oriented x 3. Somnolent and drowsy and does not awake from sleep.   Data Reviewed: I have personally reviewed following labs and imaging studies  CBC: Recent Labs  Lab 09/02/19 1642 09/03/19 0346 09/04/19 0300 09/05/19 0322  WBC 18.7* 18.2* 17.9* 20.6*  NEUTROABS 12.2*  --  13.5* 12.8*  HGB 13.7 13.0 12.9* 13.9  HCT 44.0 40.6 40.4 44.5  MCV 98.2 95.1 95.1 95.9  PLT 319 271 286 948   Basic Metabolic Panel: Recent Labs  Lab 09/02/19 1642 09/03/19 0346 09/04/19 0300 09/05/19 0322  NA 140 134* 139 139  K 3.9 3.6 3.3* 3.9  CL 103 99 105 104  CO2 25 24 23 26   GLUCOSE 106* 125* 114* 109*  BUN 20 16 14 13   CREATININE 1.00 0.85 0.92 0.85  CALCIUM 8.7* 8.2* 8.0* 8.5*  MG  --   --  2.0 2.1  PHOS  --   --  2.7 2.2*   GFR: Estimated Creatinine Clearance: 55.8 mL/min (by C-G formula based on SCr of 0.85 mg/dL).  Liver Function Tests: Recent Labs  Lab 09/03/19 0346 09/04/19 0300 09/05/19 0322  AST 33 29 26  ALT 25 23 22  ALKPHOS 99 89 99  BILITOT 1.2 0.9 0.6  PROT 6.6 6.5 6.7  ALBUMIN 3.2* 3.0* 2.9*   No results for input(s): LIPASE, AMYLASE in the last 168 hours. No results for input(s): AMMONIA in the last 168 hours. Coagulation Profile: No results for input(s): INR, PROTIME in the last 168 hours. Cardiac Enzymes: No results for input(s): CKTOTAL, CKMB, CKMBINDEX, TROPONINI in the last 168 hours. BNP (last 3 results) No results for input(s): PROBNP in the last 8760 hours. HbA1C: Recent Labs    09/05/19 0322  HGBA1C 5.2   CBG: No results for input(s): GLUCAP in the last 168 hours. Lipid Profile: No results for input(s): CHOL, HDL, LDLCALC, TRIG, CHOLHDL, LDLDIRECT in the last 72 hours. Thyroid Function Tests: No results for input(s): TSH, T4TOTAL, FREET4, T3FREE, THYROIDAB in the last 72 hours. Anemia Panel: Recent Labs    09/05/19 0322  VITAMINB12 730  FOLATE 9.5  FERRITIN 191  TIBC 180*  IRON 33*  RETICCTPCT 1.3   Sepsis Labs: No results for input(s): PROCALCITON, LATICACIDVEN in the last 168 hours.  Recent Results (from the past 240 hour(s))  Blood culture (routine x 2)     Status: None (Preliminary result)   Collection Time: 09/02/19  9:13 PM   Specimen: BLOOD  Result Value Ref Range Status   Specimen Description   Final    BLOOD LEFT ANTECUBITAL Performed at [facility], [address] [place], [place] [address]    Special Requests   Final    BOTTLES DRAWN AEROBIC AND ANAEROBIC Blood Culture results may not be optimal due to an inadequate volume of blood received in culture bottles Performed at [facility], [address], [place], [place] [address]    Culture   Final    NO GROWTH 3 DAYS Performed at [facility], [address], [place], [place] [address]    Report Status PENDING  Incomplete  Blood culture  (routine x 2)     Status: None (Preliminary result)   Collection Time: 09/02/19  9:13 PM   Specimen: BLOOD  Result Value Ref Range Status   Specimen Description   Final    BLOOD RIGHT ANTECUBITAL Performed at [facility], [address], [place], [place] [address]    Special Requests   Final    BOTTLES DRAWN AEROBIC AND ANAEROBIC Blood Culture adequate volume Performed at [facility], [address], [place], [place] [address]    Culture   Final    NO GROWTH 3 DAYS Performed at [facility], [address] [place], [place] [address]    Report Status PENDING  Incomplete  SARS CORONAVIRUS 2 (TAT 6-24 HRS) Nasopharyngeal Nasopharyngeal Swab     Status: None   Collection Time: 09/02/19  9:13 PM   Specimen: Nasopharyngeal Swab  Result Value Ref Range Status   SARS Coronavirus 2 NEGATIVE NEGATIVE Final    Comment: (NOTE) SARS-CoV-2 target nucleic acids are NOT DETECTED. The SARS-CoV-2 RNA is generally detectable in upper and lower respiratory specimens during the acute phase of infection. Negative results do not preclude SARS-CoV-2 infection, do not rule out co-infections with other pathogens, and should not be used as the sole basis for treatment or other patient management decisions. Negative results must be combined with clinical observations, patient history, and epidemiological information. The expected result is Negative. Fact Sheet for Patients: HairSlick.no Fact Sheet for Healthcare Providers: quierodirigir.com This test is not yet approved or cleared  by the [place] and  has been authorized for detection and/or diagnosis of SARS-CoV-2 by FDA under an Emergency Use Authorization (EUA). This EUA will remain  in effect (meaning this test can be used) for the duration of the COVID-19 declaration under Section 56 4(b)(1) of the Act, 21 U.S.C. section 360bbb-3(b)(1),  unless the authorization is terminated or revoked sooner. Performed at [facility], [address], [place], [place] [address]   MRSA PCR Screening     Status: Abnormal   Collection Time: 09/02/19 10:57 PM   Specimen: Nasal Mucosa; Nasopharyngeal  Result Value Ref Range Status   MRSA by PCR POSITIVE (A) NEGATIVE Final    Comment:        The GeneXpert MRSA Assay (FDA approved for NASAL specimens only), is one component of a comprehensive MRSA colonization surveillance program. It is not intended to diagnose MRSA infection nor to guide or monitor treatment for MRSA infections. CRITICAL RESULT CALLED TO, READ BACK BY AND VERIFIED WITH: RN V[place] AT 6045 09/03/19 CRUICKSHANK A Performed at [facility], [address], [place], [place] [address]   Aerobic/Anaerobic Culture (surgical/deep wound)     Status: None (Preliminary result)   Collection Time: 09/04/19 11:09 AM   Specimen: PATH Other; Tissue  Result Value Ref Range Status   Specimen Description   Final    ABSCESS NECK Performed at [facility], [address], [place], [place] [address]    Special Requests   Final    NONE Performed at [facility], [address], [place], [place] [address]    Gram Stain   Final    RARE WBC PRESENT, PREDOMINANTLY PMN RARE GRAM POSITIVE COCCI Performed at [facility], [address] [place], [place] [address]    Culture PENDING  Incomplete   Report Status PENDING  Incomplete    Radiology Studies: No results found. Scheduled Meds: . aspirin EC  81 mg Oral Daily  . Chlorhexidine Gluconate Cloth  6 each Topical Q0600  . cholecalciferol  2,000 Units Oral Daily  . enoxaparin (LOVENOX) injection  40 mg Subcutaneous QHS  . furosemide  40 mg Intravenous Once  . iron polysaccharides  150 mg Oral Daily  . mupirocin ointment  1 application Nasal BID  . phosphorus  500 mg Oral BID  . traZODone  50 mg Oral QHS  . vitamin B-12   1,000 mcg Oral Daily   Continuous Infusions: . cefTRIAXone (ROCEPHIN)  IV 2 g (09/04/19 2123)  . vancomycin 1,000 mg (09/04/19 1836)    LOS: 3 days   Merlene Laughter, DO [facility] PAGER is on AMION  If 7PM-7AM, please contact night-coverage [email or web address]

## 2019-09-06 ENCOUNTER — Inpatient Hospital Stay (HOSPITAL_COMMUNITY): Payer: PPO

## 2019-09-06 DIAGNOSIS — A4902 Methicillin resistant Staphylococcus aureus infection, unspecified site: Secondary | ICD-10-CM

## 2019-09-06 LAB — CBC WITH DIFFERENTIAL/PLATELET
Abs Immature Granulocytes: 0.28 10*3/uL — ABNORMAL HIGH (ref 0.00–0.07)
Basophils Absolute: 0.1 10*3/uL (ref 0.0–0.1)
Basophils Relative: 1 %
Eosinophils Absolute: 0.2 10*3/uL (ref 0.0–0.5)
Eosinophils Relative: 1 %
HCT: 40.8 % (ref 39.0–52.0)
Hemoglobin: 12.9 g/dL — ABNORMAL LOW (ref 13.0–17.0)
Immature Granulocytes: 1 %
Lymphocytes Relative: 32 %
Lymphs Abs: 6.3 10*3/uL — ABNORMAL HIGH (ref 0.7–4.0)
MCH: 30.6 pg (ref 26.0–34.0)
MCHC: 31.6 g/dL (ref 30.0–36.0)
MCV: 96.7 fL (ref 80.0–100.0)
Monocytes Absolute: 1.3 10*3/uL — ABNORMAL HIGH (ref 0.1–1.0)
Monocytes Relative: 6 %
Neutro Abs: 11.9 10*3/uL — ABNORMAL HIGH (ref 1.7–7.7)
Neutrophils Relative %: 59 %
Platelets: 303 10*3/uL (ref 150–400)
RBC: 4.22 MIL/uL (ref 4.22–5.81)
RDW: 14.1 % (ref 11.5–15.5)
WBC: 20.1 10*3/uL — ABNORMAL HIGH (ref 4.0–10.5)
nRBC: 0 % (ref 0.0–0.2)

## 2019-09-06 LAB — COMPREHENSIVE METABOLIC PANEL
ALT: 20 U/L (ref 0–44)
AST: 28 U/L (ref 15–41)
Albumin: 2.8 g/dL — ABNORMAL LOW (ref 3.5–5.0)
Alkaline Phosphatase: 89 U/L (ref 38–126)
Anion gap: 9 (ref 5–15)
BUN: 22 mg/dL (ref 8–23)
CO2: 29 mmol/L (ref 22–32)
Calcium: 8.3 mg/dL — ABNORMAL LOW (ref 8.9–10.3)
Chloride: 99 mmol/L (ref 98–111)
Creatinine, Ser: 1.14 mg/dL (ref 0.61–1.24)
GFR calc Af Amer: >60 mL/min (ref >60)
GFR calc non Af Amer: 60 mL/min — ABNORMAL LOW (ref >60)
Glucose, Bld: 92 mg/dL (ref 70–99)
Potassium: 3.2 mmol/L — ABNORMAL LOW (ref 3.5–5.1)
Sodium: 137 mmol/L (ref 135–145)
Total Bilirubin: 0.9 mg/dL (ref 0.3–1.2)
Total Protein: 6.5 g/dL (ref 6.5–8.1)

## 2019-09-06 LAB — PHOSPHORUS: Phosphorus: 3.8 mg/dL (ref 2.5–4.6)

## 2019-09-06 LAB — MAGNESIUM: Magnesium: 2.3 mg/dL (ref 1.7–2.4)

## 2019-09-06 MED ORDER — LEVALBUTEROL HCL 0.63 MG/3ML IN NEBU
0.6300 mg | INHALATION_SOLUTION | Freq: Two times a day (BID) | RESPIRATORY_TRACT | Status: DC
  Administered 2019-09-07 – 2019-09-08 (×4): 0.63 mg via RESPIRATORY_TRACT
  Filled 2019-09-06 (×6): qty 3

## 2019-09-06 MED ORDER — POTASSIUM CHLORIDE CRYS ER 20 MEQ PO TBCR
40.0000 meq | EXTENDED_RELEASE_TABLET | Freq: Two times a day (BID) | ORAL | Status: AC
  Administered 2019-09-06 (×2): 40 meq via ORAL
  Filled 2019-09-06 (×2): qty 2

## 2019-09-06 MED ORDER — IPRATROPIUM BROMIDE 0.02 % IN SOLN
0.5000 mg | Freq: Four times a day (QID) | RESPIRATORY_TRACT | Status: DC
  Administered 2019-09-06: 10:00:00 0.5 mg via RESPIRATORY_TRACT
  Filled 2019-09-06: qty 2.5

## 2019-09-06 MED ORDER — SODIUM CHLORIDE (PF) 0.9 % IJ SOLN
INTRAMUSCULAR | Status: AC
  Administered 2019-09-06: 18:00:00
  Filled 2019-09-06: qty 50

## 2019-09-06 MED ORDER — LEVALBUTEROL HCL 0.63 MG/3ML IN NEBU
0.6300 mg | INHALATION_SOLUTION | Freq: Four times a day (QID) | RESPIRATORY_TRACT | Status: DC
  Administered 2019-09-06: 0.63 mg via RESPIRATORY_TRACT
  Filled 2019-09-06 (×2): qty 3

## 2019-09-06 MED ORDER — IOHEXOL 350 MG/ML SOLN
100.0000 mL | Freq: Once | INTRAVENOUS | Status: AC | PRN
  Administered 2019-09-06: 100 mL via INTRAVENOUS

## 2019-09-06 MED ORDER — IPRATROPIUM BROMIDE 0.02 % IN SOLN
0.5000 mg | Freq: Two times a day (BID) | RESPIRATORY_TRACT | Status: DC
  Administered 2019-09-06 – 2019-09-08 (×5): 0.5 mg via RESPIRATORY_TRACT
  Filled 2019-09-06 (×5): qty 2.5

## 2019-09-06 NOTE — Progress Notes (Signed)
PROGRESS NOTE    [name]  HKV:[id number] DOB: [birth year] DOA: 09/02/2019 PCP: Kristopher Evans   Brief Narrative:  HPI Evans Dr. Earlie Evans on 09/02/2019 Kristopher Evans is a 84 y.o. male with medical history significant of dementia who lives in a skilled facility brought in due to left neck swelling and mild fever.  This has been noted to be present for the last 3 days.  It is behind the left ear.  Patient was seen in the ER and evaluated.  Appears to have an abscess.  Incision and drainage was attempted in the ER.  Initial attempt was not very successful.  ENT was consulted and recommends admission with IV antibiotics.  Dr. Annalee Evans is willing to see the patient if not better for any further surgical intervention.  Patient is confused not able to give any adequate history due to his dementia.  History is therefore obtained mainly from nursing home records..  ED Course: Temperature is 99.2 blood pressure 196/84 pulse 108 respiratory of 18 oxygen sat 98% room air.  White count is 18.7 otherwise the rest of CBC and chemistry are within normal.  COVID-19 screen is currently pending.  CT neck showed 3 cm left neck abscess and surrounding cellulitis.  Also mildly enlarged left neck lymph nodes likely reactive.  I&D was done and patient is being admitted for further work-up  **Interim History Continued to have some induration and minimal drainage from his abscess site so Consulted ENT for further evaluation recommendations and they recommended I and D in the OR which was done on 09/04/2019. Discontinued IV fluids at this time and obtain CXR as patient is now on 2 Liters and sounds rhonchus.  Was given IV Lasix and continues to be on supplemental oxygen so we will obtain his SLP evaluation to make sure patient is aspirating. Patient remains severely demented and was somnolent on my examination.  I spoke with infectious diseases Dr. Felipe Evans and he recommends continuing IV vancomycin for now,  stopping IV ceftriaxone, as well as repeating a neck CT scan as well as a CTA of the chest to rule out Lemierre's syndrome.  Assessment & Plan:   Principal Problem:   Neck abscess Active Problems:   Dementia (HCC)   HTN (hypertension)   Leucocytosis   Pressure injury of skin   Hypokalemia   Normocytic anemia   Acute respiratory failure with hypoxia (HCC)   Hypophosphatemia  Left Neck Abscess and Cellulitis s/p I&D in the OR POD 2 by Dr. Annalee Evans  -CT Neck showed "3 cm left neck abscess and surrounding cellulitis.  Mildly enlarged left neck lymph nodes, likely reactive. Aortic Atherosclerosis." -Status post I&D by EDP with minimal drainage so was taken to the OR -Admitted the patient.   -Started on IV Vancomycin and IV Cefepime but will continue Vancomycin and De-escalate Cefepime to IV Ceftriaxone; will continue IV vancomycin and stop IV ceftriaxone -Cultures have been obtained; Blood Cx x2 show NGTD at 3 Days and Wound Gram Stain shows Rare Gram + Cocci with Moderate MRSA -Neck area was still indurated and had minimal purulent drainage so ENT was consulted and they recommending I and D in the OR and this was done yesterday; -Leukocytosis is still elevated patient WBC went from 18.7 -> 18.2 -> 17.9 -> 20.6 -> 20.1 -Of note patient is MRSA positive via PCR and SARS-CoV-2 was negative -C/w Pain control with Tramadol 50 mg po q6hprn and Supportive Care with Zofran 4 mg po/IV  q6hprn   -See Dr. Thurmon Evans operative note  -Postoperatively he is now on 2 Liters and will wean -Will stop IVF and given IV Lasix yesterday -We will stop IV ceftriaxone now given MRSA; I discussed the case with Dr. Felipe Evans of infectious diseases who recommends obtaining repeat neck CT scan as well as a CT angio of the chest to rule out Lemierre's syndrome  Acute Respiratory Failure with Hypoxia -Postoperatively -Now on 2 Liters with Saturation at 96; Continuous Pulse Oximetry  -Stop IVF; Strict I's and O's  and Daily Weights; I's and O's are inaccurate -Given 40 mg of IV Lasix -Checked CXR yesterday and today: CXR showed "Stable perihilar infiltrates likely reflecting pneumonia or aspiration." -I discussed the case with infectious diseases Dr. Felipe Evans who recommends obtaining a repeat neck CT as well as a CT angio of the chest to rule out  syndrome -Get SLP evaluation to ensure patient is not asprirating -Aspiration Precautions  -Follow-up on CT scan with contrast  Dementia -Remains confused and today was somnolent -Given Trazodone but appears to have more reaction to it -Given Haldol 2.5 mg IV during this Admission  -Continue to monitor and may need a sitter   Hypertension -Blood pressure is markedly elevated but no has no documented history of hypertension.   -Likely in the setting of Infection -Will monitor and initiate treatment with prn 25 mg po q6hprn Hydralazine for SBP>160 or DBP>90 -BP this AM was 165/87  Leukocytosis -Secondary to abscess -Mild and was trending down slightly but had an acute bump -WBC went from 18.7 -> 18.2 -> 17.9 -> 20.6 -> 20.1 -C/w Abx as Above until Cx results; for now continue IV vancomycin and may be able to be transitioned to oral linezolid for at least 3 weeks from surgery -Continue to Monitor and Trend and Repeat CBC in AM   Hyponatremia -Mild as Na+ was 134 -> 139 -> 137 -Discontinued NS at 75 mL/hr for now -Continue to Monitor and Trend -Repeat CMP in AM   Hyperglycemia -Patient's Blood Sugar on Admission was elevated at 106 and repeat this AM was 92 -Checkrf HbA1c and was 5.2 -Continue to Monitor Blood Sugars Carefully and if necessary will place on Sensitive Novolog SSI  Hypokalemia -Patient's potassium this morning was 3.2 -Please with p.o. potassium chloride 40 mEq twice daily x2 doses -Continue to monitor and replete as necessary -Repeat CMP in a.m.  Normocytic Anemia -Likely dilutional drop -Patient  hemoglobin/hematocrit went from 13.7/44.0 on admission is now 12.9/40.8  -Checked Anemia Panel and showed an Iron Level of 33, UIBC of 147, TIBC of 180, Saturation Ratios of 18, Ferritin Level of 191, Folate Level of 9.5 and Vitamin B12 Level of 730 -Will start Iron Polysaccharides 150 mg po Daily  -Continue to monitor for signs and symptoms of bleeding; currently no overt bleeding noted -Repeat CBC in the a.m.  Hypophosphatemia -Patient's Phos Level this AM this AM was 3.8 -Continue to Monitor and Replete as necessary  -Repeat Phos Level in the AM  DVT prophylaxis: Enoxaparin 40 mg sq q24h Code Status: FULL CODE Family Communication: No family at bedside  Disposition Plan: Anticipate D/C Back to SNF when improved and Abscess and Cellulitis is improving   Consultants:   ENT Dr. Annalee Evans  Case was discussed with infectious diseases Dr. Felipe Evans   Procedures: I and D Done by EDP; Incision and Drainage of left neck abscess done by Dr. Annalee Evans on 09/04/2019   Antimicrobials:  Anti-infectives (From admission,  onward)   Start     Dose/Rate Route Frequency Ordered Stop   09/04/19 1800  vancomycin (VANCOCIN) IVPB 1000 mg/200 mL premix     1,000 mg 200 mL/hr over 60 Minutes Intravenous Every 24 hours 09/04/19 1159     09/03/19 2200  cefTRIAXone (ROCEPHIN) 2 g in sodium chloride 0.9 % 100 mL IVPB     2 g 200 mL/hr over 30 Minutes Intravenous Every 24 hours 09/03/19 1125     09/03/19 1800  vancomycin (VANCOCIN) 1,250 mg in sodium chloride 0.9 % 250 mL IVPB  Status:  Discontinued     1,250 mg 166.7 mL/hr over 90 Minutes Intravenous Every 24 hours 09/03/19 0546 09/04/19 1159   09/03/19 1000  ceFEPIme (MAXIPIME) 2 g in sodium chloride 0.9 % 100 mL IVPB  Status:  Discontinued     2 g 200 mL/hr over 30 Minutes Intravenous Every 12 hours 09/03/19 0546 09/03/19 1125   09/02/19 2145  ceFEPIme (MAXIPIME) 2 g in sodium chloride 0.9 % 100 mL IVPB     2 g 200 mL/hr over 30 Minutes  Intravenous  Once 09/02/19 2143 09/03/19 0239   09/02/19 2130  vancomycin (VANCOCIN) IVPB 1000 mg/200 mL premix     1,000 mg 200 mL/hr over 60 Minutes Intravenous  Once 09/02/19 2122 09/03/19 0004     Subjective: Seen and examined at bedside and he was encephalopathic and unable to provide a subjective history.  Was monitoring something that I could not understand.  Attempting to try and get out of bed.  No other concerns or complaints at this time and nursing reports that he still on 2 L supplemental oxygen  Objective: Vitals:   09/06/19 0525 09/06/19 1024 09/06/19 1200 09/06/19 1308  BP: (!) 153/74   (!) 165/87  Pulse: 67   97  Resp: 18   18  Temp: 98.9 F (37.2 C)   98.4 F (36.9 C)  TempSrc: Oral   Oral  SpO2: 100% 91% 94% 97%  Weight:      Height:        Intake/Output Summary (Last 24 hours) at 09/06/2019 1534 Last data filed at 09/06/2019 1400 Gross Evans 24 hour  Intake 780 ml  Output 901 ml  Net -121 ml   Filed Weights   09/03/19 0528  Weight: 58.9 kg   Examination: Physical Exam:  Constitutional: Thin elderly Caucasian male was more awake and alert today but still remains encephalopathic and attempting to climb out of the bed and pull off his covers Eyes: Lids and conjunctivae normal, sclerae anicteric  ENMT: External Ears, Nose appear normal. Grossly normal hearing. Mucous membranes are moist. Neck: Left-sided his neck is improving and is a Penrose drain.  Area is not cellulitic Respiratory: Diminished to auscultation bilaterally with coarse breath sounds, no wheezing, rales, rhonchi or crackles. Normal respiratory effort and patient is not tachypenic. No accessory muscle use.  Unlabored breathing baseline supplemental oxygen via nasal cannula Cardiovascular: RRR, no murmurs / rubs / gallops. S1 and S2 auscultated. No extremity edema.  Abdomen: Soft, Non-tender, non-distended.  Has a large inguinal hernia on the right. Bowel sounds positive.  GU: Deferred.  Musculoskeletal: No clubbing / cyanosis of digits/nails. No joint deformity upper and lower extremities.  Skin: No rashes, lesions, ulcers and has a cellulitic area which is improving on the left side of his neck with a Penrose drain from his I&D of his abscess and he has some plaque-like lesions on his right leg as well as  his abdomen. No induration; Warm and dry.  Neurologic: CN 2-12 grossly intact with no focal deficits and answers questions about unable to comprehend what he is saying.  Romberg sign and cerebellar reflexes not assessed.  Psychiatric: Impaired judgment and insight. Alert and awake but he is not oriented x 3.  Slightly agitated mood and appropriate affect.   Data Reviewed: I have personally reviewed following labs and imaging studies  CBC: Recent Labs  Lab 09/02/19 1642 09/03/19 0346 09/04/19 0300 09/05/19 0322 09/06/19 0506  WBC 18.7* 18.2* 17.9* 20.6* 20.1*  NEUTROABS 12.2*  --  13.5* 12.8* 11.9*  HGB 13.7 13.0 12.9* 13.9 12.9*  HCT 44.0 40.6 40.4 44.5 40.8  MCV 98.2 95.1 95.1 95.9 96.7  PLT 319 271 286 321 303   Basic Metabolic Panel: Recent Labs  Lab 09/02/19 1642 09/03/19 0346 09/04/19 0300 09/05/19 0322 09/06/19 0506  NA 140 134* 139 139 137  K 3.9 3.6 3.3* 3.9 3.2*  CL 103 99 105 104 99  CO2 GLUCOSE 106* 125* 114* 109* 92  BUN CREATININE 1.00 0.85 0.92 0.85 1.14  CALCIUM 8.7* 8.2* 8.0* 8.5* 8.3*  MG  --   --  2.0 2.1 2.3  PHOS  --   --  2.7 2.2* 3.8   GFR: Estimated Creatinine Clearance: 41.6 mL/min (by C-G formula based on SCr of 1.14 mg/dL). Liver Function Tests: Recent Labs  Lab 09/03/19 0346 09/04/19 0300 09/05/19 0322 09/06/19 0506  AST 33 ALT ALKPHOS 99 89 99 89  BILITOT 1.2 0.9 0.6 0.9  PROT 6.6 6.5 6.7 6.5  ALBUMIN 3.2* 3.0* 2.9* 2.8*   No results for input(s): LIPASE, AMYLASE in the last 168 hours. No results for input(s): AMMONIA in the last 168 hours. Coagulation  Profile: No results for input(s): INR, PROTIME in the last 168 hours. Cardiac Enzymes: No results for input(s): CKTOTAL, CKMB, CKMBINDEX, TROPONINI in the last 168 hours. BNP (last 3 results) No results for input(s): PROBNP in the last 8760 hours. HbA1C: Recent Labs    09/05/19 0322  HGBA1C 5.2   CBG: No results for input(s): GLUCAP in the last 168 hours. Lipid Profile: No results for input(s): CHOL, HDL, LDLCALC, TRIG, CHOLHDL, LDLDIRECT in the last 72 hours. Thyroid Function Tests: No results for input(s): TSH, T4TOTAL, FREET4, T3FREE, THYROIDAB in the last 72 hours. Anemia Panel: Recent Labs    09/05/19 0322  VITAMINB12 730  FOLATE 9.5  FERRITIN 191  TIBC 180*  IRON 33*  RETICCTPCT 1.3   Sepsis Labs: No results for input(s): PROCALCITON, LATICACIDVEN in the last 168 hours.  Recent Results (from the past 240 hour(s))  Blood culture (routine x 2)     Status: None (Preliminary result)   Collection Time: 09/02/19  9:13 PM   Specimen: BLOOD  Result Value Ref Range Status   Specimen Description   Final    BLOOD LEFT ANTECUBITAL Performed at [facility], [address] [place], [place] [address]    Special Requests   Final    BOTTLES DRAWN AEROBIC AND ANAEROBIC Blood Culture results may not be optimal due to an inadequate volume of blood received in culture bottles Performed at [facility], [address], [place], [place] [address]    Culture   Final    NO GROWTH 4 DAYS Performed at [facility], [address],  [place], [place] [address]    Report Status PENDING  Incomplete  Blood culture (routine x 2)     Status: None (Preliminary result)   Collection Time: 09/02/19  9:13 PM   Specimen: BLOOD  Result Value Ref Range Status   Specimen Description   Final    BLOOD RIGHT ANTECUBITAL Performed at [facility] [address], [place], [place] [address]    Special Requests   Final    BOTTLES DRAWN  AEROBIC AND ANAEROBIC Blood Culture adequate volume Performed at [facility] [address], [place], [place] [address]    Culture   Final    NO GROWTH 4 DAYS Performed at [facility], [place] [address] [place], [place] [address]    Report Status PENDING  Incomplete  SARS CORONAVIRUS 2 (TAT 6-24 HRS) Nasopharyngeal Nasopharyngeal Swab     Status: None   Collection Time: 09/02/19  9:13 PM   Specimen: Nasopharyngeal Swab  Result Value Ref Range Status   SARS Coronavirus 2 NEGATIVE NEGATIVE Final    Comment: (NOTE) SARS-CoV-2 target nucleic acids are NOT DETECTED. The SARS-CoV-2 RNA is generally detectable in upper and lower respiratory specimens during the acute phase of infection. Negative results do not preclude SARS-CoV-2 infection, do not rule out co-infections with other pathogens, and should not be used as the sole basis for treatment or other patient management decisions. Negative results must be combined with clinical observations, patient history, and epidemiological information. The expected result is Negative. Fact Sheet for Patients: SugarRoll.be Fact Sheet for Healthcare Providers: https://www.woods-mathews.com/ This test is not yet approved or cleared by the [place] FDA and  has been authorized for detection and/or diagnosis of SARS-CoV-2 by FDA under an Emergency Use Authorization (EUA). This EUA will remain  in effect (meaning this test can be used) for the duration of the COVID-19 declaration under Section 56 4(b)(1) of the Act, 21 U.S.C. section 360bbb-3(b)(1), unless the authorization is terminated or revoked sooner. Performed at [facility], [place] [address] [place], [place] [address]   MRSA PCR Screening     Status: Abnormal   Collection Time: 09/02/19 10:57 PM   Specimen: Nasal Mucosa; Nasopharyngeal  Result Value Ref Range Status   MRSA by PCR POSITIVE (A) NEGATIVE Final     Comment:        The GeneXpert MRSA Assay (FDA approved for NASAL specimens only), is one component of a comprehensive MRSA colonization surveillance program. It is not intended to diagnose MRSA infection nor to guide or monitor treatment for MRSA infections. CRITICAL RESULT CALLED TO, READ BACK BY AND VERIFIED WITH: RN VIR[place] AT 7425 09/03/19 CRUICKSHANK A Performed at [facility], [place] [address], [place], [place] [address]   Aerobic/Anaerobic Culture (surgical/deep wound)     Status: None (Preliminary result)   Collection Time: 09/04/19 11:09 AM   Specimen: PATH Other; Tissue  Result Value Ref Range Status   Specimen Description   Final    ABSCESS NECK Performed at [facility], [address] [place], [place] [address]    Special Requests   Final    NONE Performed at [facility], [place] [address] [place], [place] [address]    Gram Stain   Final    RARE WBC PRESENT, PREDOMINANTLY PMN RARE GRAM POSITIVE COCCI Performed at [facility], [place] [address] [place], [place] [address]    Culture   Final    MODERATE  METHICILLIN RESISTANT STAPHYLOCOCCUS AUREUS NO ANAEROBES ISOLATED; CULTURE IN PROGRESS FOR 5 DAYS    Report Status PENDING  Incomplete   Organism ID, Bacteria METHICILLIN RESISTANT STAPHYLOCOCCUS AUREUS  Final      Susceptibility   Methicillin resistant staphylococcus aureus - MIC*    CIPROFLOXACIN >=8 RESISTANT Resistant     ERYTHROMYCIN >=8 RESISTANT Resistant     GENTAMICIN <=0.5 SENSITIVE Sensitive     OXACILLIN >=4 RESISTANT Resistant     TETRACYCLINE <=1 SENSITIVE Sensitive     VANCOMYCIN <=0.5 SENSITIVE Sensitive     TRIMETH/SULFA <=10 SENSITIVE Sensitive     CLINDAMYCIN <=0.25 SENSITIVE Sensitive     RIFAMPIN <=0.5 SENSITIVE Sensitive     Inducible Clindamycin NEGATIVE Sensitive     * MODERATE METHICILLIN RESISTANT STAPHYLOCOCCUS AUREUS    Radiology Studies: Dg Chest Port 1 View  Result Date:  09/06/2019 CLINICAL DATA:  Shortness of breath EXAM: PORTABLE CHEST 1 VIEW COMPARISON:  Yesterday FINDINGS: Continued perihilar infiltrates. No Kerley lines, effusion, or pneumothorax. Normal heart size and stable aortic tortuosity. IMPRESSION: Stable perihilar infiltrates likely reflecting pneumonia or aspiration. Electronically Signed   By: Marnee SpringJonathon  Watts M.D.   On: 09/06/2019 07:09   Dg Chest Port 1 View  Result Date: 09/05/2019 CLINICAL DATA:  84 year old male with shortness of breath and rhonchi on physical exam EXAM: PORTABLE CHEST 1 VIEW COMPARISON:  Prior chest x-ray 12/15/2016 FINDINGS: Rounded but ill-defined airspace opacity in the right lung base which may represent infiltrate or mass. The cardiac and mediastinal contours are within normal limits. There may be slight widening of the right paratracheal soft tissue stripe. Atherosclerotic calcifications are visualized in the thoracic aorta. No pulmonary edema, pleural effusion or pneumothorax. No acute osseous abnormality. IMPRESSION: Rounded airspace opacity in the right lung base which may reflect pneumonia or mass. Recommend dedicated PA and lateral chest x-ray for further evaluation. Aortic Atherosclerosis (ICD10-170.0) Electronically Signed   By: Malachy MoanHeath  McCullough M.D.   On: 09/05/2019 13:15   Scheduled Meds: . aspirin EC  81 mg Oral Daily  . Chlorhexidine Gluconate Cloth  6 each Topical Q0600  . cholecalciferol  2,000 Units Oral Daily  . enoxaparin (LOVENOX) injection  40 mg Subcutaneous QHS  . ipratropium  0.5 mg Nebulization BID  . iron polysaccharides  150 mg Oral Daily  . levalbuterol  0.63 mg Nebulization BID  . mupirocin ointment  1 application Nasal BID  . potassium chloride  40 mEq Oral BID  . traZODone  50 mg Oral QHS  . vitamin B-12  1,000 mcg Oral Daily   Continuous Infusions: . cefTRIAXone (ROCEPHIN)  IV 2 g (09/05/19 2102)  . vancomycin 1,000 mg (09/05/19 1823)    LOS: 4 days   Kristopher Evans , DO [facility] PAGER is on AMION  If 7PM-7AM, please contact night-coverage [email or web address]

## 2019-09-07 ENCOUNTER — Encounter (HOSPITAL_COMMUNITY): Payer: Self-pay | Admitting: Otolaryngology

## 2019-09-07 ENCOUNTER — Inpatient Hospital Stay (HOSPITAL_COMMUNITY): Payer: PPO

## 2019-09-07 DIAGNOSIS — J690 Pneumonitis due to inhalation of food and vomit: Secondary | ICD-10-CM

## 2019-09-07 LAB — CBC WITH DIFFERENTIAL/PLATELET
Abs Immature Granulocytes: 0.4 10*3/uL — ABNORMAL HIGH (ref 0.00–0.07)
Basophils Absolute: 0.2 10*3/uL — ABNORMAL HIGH (ref 0.0–0.1)
Basophils Relative: 1 %
Eosinophils Absolute: 0.2 10*3/uL (ref 0.0–0.5)
Eosinophils Relative: 1 %
HCT: 44 % (ref 39.0–52.0)
Hemoglobin: 14 g/dL (ref 13.0–17.0)
Immature Granulocytes: 2 %
Lymphocytes Relative: 24 %
Lymphs Abs: 4.6 10*3/uL — ABNORMAL HIGH (ref 0.7–4.0)
MCH: 30.8 pg (ref 26.0–34.0)
MCHC: 31.8 g/dL (ref 30.0–36.0)
MCV: 96.9 fL (ref 80.0–100.0)
Monocytes Absolute: 1.4 10*3/uL — ABNORMAL HIGH (ref 0.1–1.0)
Monocytes Relative: 7 %
Neutro Abs: 12.4 10*3/uL — ABNORMAL HIGH (ref 1.7–7.7)
Neutrophils Relative %: 65 %
Platelets: 332 10*3/uL (ref 150–400)
RBC: 4.54 MIL/uL (ref 4.22–5.81)
RDW: 14.1 % (ref 11.5–15.5)
WBC: 19.1 10*3/uL — ABNORMAL HIGH (ref 4.0–10.5)
nRBC: 0 % (ref 0.0–0.2)

## 2019-09-07 LAB — CULTURE, BLOOD (ROUTINE X 2)
Culture: NO GROWTH
Culture: NO GROWTH
Special Requests: ADEQUATE

## 2019-09-07 LAB — COMPREHENSIVE METABOLIC PANEL
ALT: 33 U/L (ref 0–44)
AST: 49 U/L — ABNORMAL HIGH (ref 15–41)
Albumin: 3 g/dL — ABNORMAL LOW (ref 3.5–5.0)
Alkaline Phosphatase: 100 U/L (ref 38–126)
Anion gap: 9 (ref 5–15)
BUN: 19 mg/dL (ref 8–23)
CO2: 27 mmol/L (ref 22–32)
Calcium: 8.3 mg/dL — ABNORMAL LOW (ref 8.9–10.3)
Chloride: 103 mmol/L (ref 98–111)
Creatinine, Ser: 0.84 mg/dL (ref 0.61–1.24)
GFR calc Af Amer: >60 mL/min (ref >60)
GFR calc non Af Amer: >60 mL/min (ref >60)
Glucose, Bld: 102 mg/dL — ABNORMAL HIGH (ref 70–99)
Potassium: 3.7 mmol/L (ref 3.5–5.1)
Sodium: 139 mmol/L (ref 135–145)
Total Bilirubin: 1 mg/dL (ref 0.3–1.2)
Total Protein: 7 g/dL (ref 6.5–8.1)

## 2019-09-07 LAB — MAGNESIUM: Magnesium: 2.2 mg/dL (ref 1.7–2.4)

## 2019-09-07 LAB — PHOSPHORUS: Phosphorus: 2.4 mg/dL — ABNORMAL LOW (ref 2.5–4.6)

## 2019-09-07 LAB — SURGICAL PATHOLOGY

## 2019-09-07 MED ORDER — K PHOS MONO-SOD PHOS DI & MONO 155-852-130 MG PO TABS
500.0000 mg | ORAL_TABLET | Freq: Two times a day (BID) | ORAL | Status: AC
  Administered 2019-09-07 (×2): 500 mg via ORAL
  Filled 2019-09-07 (×2): qty 2

## 2019-09-07 MED ORDER — ARFORMOTEROL TARTRATE 15 MCG/2ML IN NEBU
15.0000 ug | INHALATION_SOLUTION | Freq: Two times a day (BID) | RESPIRATORY_TRACT | Status: DC
  Administered 2019-09-07 – 2019-09-08 (×2): 15 ug via RESPIRATORY_TRACT
  Filled 2019-09-07 (×3): qty 2

## 2019-09-07 MED ORDER — BUDESONIDE 0.25 MG/2ML IN SUSP
0.2500 mg | Freq: Two times a day (BID) | RESPIRATORY_TRACT | Status: DC
  Administered 2019-09-07 – 2019-09-08 (×2): 0.25 mg via RESPIRATORY_TRACT
  Filled 2019-09-07 (×2): qty 2

## 2019-09-07 MED ORDER — SODIUM CHLORIDE 0.9 % IV SOLN
2.0000 g | INTRAVENOUS | Status: DC
  Administered 2019-09-07 – 2019-09-10 (×4): 2 g via INTRAVENOUS
  Filled 2019-09-07 (×3): qty 2
  Filled 2019-09-07: qty 20

## 2019-09-07 MED ORDER — METRONIDAZOLE IN NACL 5-0.79 MG/ML-% IV SOLN
500.0000 mg | Freq: Three times a day (TID) | INTRAVENOUS | Status: DC
  Administered 2019-09-07 – 2019-09-10 (×10): 500 mg via INTRAVENOUS
  Filled 2019-09-07 (×10): qty 100

## 2019-09-07 NOTE — Progress Notes (Signed)
PROGRESS NOTE    Kristopher Evans  WUJ:[id number] DOB: July 22, 1935 DOA: 09/02/2019 PCP: Patient, No Pcp Per   Brief Narrative:  HPI per [name] on 09/02/2019 Kristopher Evans is a 84 y.o. male with medical history significant of dementia who lives in a skilled facility brought in due to left neck swelling and mild fever.  This has been noted to be present for the last 3 days.  It is behind the left ear.  Patient was seen in the ER and evaluated.  Appears to have an abscess.  Incision and drainage was attempted in the ER.  Initial attempt was not very successful.  ENT was consulted and recommends admission with IV antibiotics.  Dr. Annalee Genta is willing to see the patient if not better for any further surgical intervention.  Patient is confused not able to give any adequate history due to his dementia.  History is therefore obtained mainly from nursing home records..  ED Course: Temperature is 99.2 blood pressure 196/84 pulse 108 respiratory of 18 oxygen sat 98% room air.  White count is 18.7 otherwise the rest of CBC and chemistry are within normal.  COVID-19 screen is currently pending.  CT neck showed 3 cm left neck abscess and surrounding cellulitis.  Also mildly enlarged left neck lymph nodes likely reactive.  I&D was done and patient is being admitted for further work-up  **Interim History Continued to have some induration and minimal drainage from his abscess site so Consulted ENT for further evaluation recommendations and they recommended I and D in the OR which was done on 09/04/2019. Discontinued IV fluids at this time and obtain CXR as patient is now on 2 Liters and sounds rhonchus.  Was given IV Lasix and continues to be on supplemental oxygen so we will obtain his SLP evaluation to make sure patient is aspirating likely patient has aspirated. Patient remains severely demented and was somnolent on my examination today and was very difficult to arouse but nursing states that he had a rough  night.  I spoke with infectious diseases [name] yesterday and he recommends continuing IV vancomycin for now, stopping IV ceftriaxone, as well as repeating a neck CT scan as well as a CTA of the chest to rule out Lemierre's syndrome.  CT of the chest was done and was very limited but showed no PE but there is bilateral lower lobe consolidation concerning for multifocal pneumonia or aspiration and there is debris noted within the trachea and the right mainstem bronchus which may be secondary to aspiration.  Patient has small bilateral pleural effusions and had improved appearance of left neck abscess following surgical drainage with slightly over 1 cm in collection with all for improved and cellulitic changes.  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  Patient was more somnolent today and will continue monitor and may need a head CT without contrast for an MRI and likely is in the setting of aspiration.  Will need to discuss with infectious diseases as well as vascular surgery in the a.m. for further recommendations and evaluations.  Assessment & Plan:   Principal Problem:   Neck abscess Active Problems:   Dementia (HCC)   HTN (hypertension)   Leucocytosis   Pressure injury of skin   Hypokalemia   Normocytic anemia   Acute respiratory failure with hypoxia (HCC)   Hypophosphatemia  Left Neck Abscess and Cellulitis s/p I&D in the OR POD 3 by [name]  [name] showed "3 cm left neck abscess and surrounding cellulitis.  Mildly enlarged left neck lymph nodes, likely reactive. Aortic Atherosclerosis." -Status post I&D by EDP with minimal drainage so was taken to the OR -Admitted the patient.   -Started on IV Vancomycin and IV Cefepime but will continue Vancomycin and De-escalate Cefepime to IV Ceftriaxone; will continue IV  vancomycin and stop IV ceftriaxone but restarted it due to concern for Aspiration as Patient iS PCN Allergic  -Cultures have been obtained; Blood Cx x2 show NGTD at 3 Days and Wound Gram Stain shows Rare Gram + Cocci with Moderate MRSA was sensitive to clindamycin, gentamicin, rifampin, tetracycline, Bactrim and vancomycin -Neck area was still indurated and had minimal purulent drainage so ENT was consulted and they recommending I and D in the OR -Leukocytosis is still elevated patient WBC went from 18.7 -> 18.2 -> 17.9 -> 20.6 -> 20.1 -> 19.1 -Of note patient is MRSA positive via PCR and SARS-CoV-2 was negative -C/w Pain control with Tramadol 50 mg po q6hprn and Supportive Care with Zofran 4 mg po/IV q6hprn   -See [name] operative note  -Postoperatively he is now on 2 Liters and will wean -Will stop IVF and given IV Lasix yesterday -Initially stopped IV ceftriaxone now given MRSA but restarted it due to Aspiration; I discussed the case with [name] of infectious diseases who recommends obtaining repeat neck CT scan as well as a CT angio of the chest to rule out Lemierre's syndrome -CT of the chest was done and was very limited but showed no PE but there is bilateral lower lobe consolidation concerning for multifocal pneumonia or aspiration and there is debris noted within the trachea and the right mainstem bronchus which may be secondary to aspiration.  Patient has small bilateral pleural effusions and had improved appearance of left neck abscess following surgical drainage with slightly over 1 cm in collection with all for improved and cellulitic changes.  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 -I spoke with [name] of vascular surgery who feels that this is a little incidental finding and he states that he would not do  anything with this and was not involved patient in outpatient setting  Acute Respiratory Failure with Hypoxia likely in the setting of aspiration and aspiration pneumonia -Postoperatively -Now on 2 Liters with Saturation at 96; Continuous Pulse Oximetry  -Stop IVF; Strict I's and O's and Daily Weights; I's and O's are inaccurate -Given 40 mg of IV Lasix the day before yesterday -Checked CXR today showed "Interstitial and airspace opacity predominantly in the right mid toinfrahilar lung. No pneumothorax or visible effusion. The aorta is calcified. The remaining cardiomediastinal contours are unremarkable. No acute osseous or soft tissue abnormality." -I discussed the case with infectious diseases [name] yesterday who recommended obtaining a repeat neck CT as well as a CT angio of the chest to rule out Lemeirre syndrome -Get SLP evaluation to ensure patient is not asprirating but likely patient is aspirating as nursing states that he coughed after his nose yesterday. -Aspiration Precautions  -Follow-up on CT scan with contrast as above -SLP attempted today however patient was more somnolent and encephalopathic and unable to participate given his fatigue -Placed on Xopenex/Atrovent scheduled; also started patient  on budesonide and Brovana -Guaifenesin 601 p.o. twice daily as needed to loosen cough -Patient is not awake enough and will maintain head of bed greater than 30 degrees -We will have infectious diseases formally weigh in in the a.m. -Repeat chest x-ray in a.m.  Somnolence and fatigue -In the setting of aspiration -If not improving by tomorrow we will check a head CT without contrast and possibly MRI and discuss with neurology -Aspiration cautions -Treat aspiration pneumonia as above -Treating underlying causes and still not improving will have neurology formally consult  Dementia -Remains confused and continues to be somnolent more so than yesterday -Given Trazodone but  appears to have more reaction to it -Given Haldol 2.5 mg IV during this Admission and will hold further doses -Continue to monitor and may need a sitter   Hypertension -Blood pressure is markedly elevated but no has no documented history of hypertension.   -Likely in the setting of Infection -Will monitor and initiate treatment with prn 25 mg po q6hprn Hydralazine for SBP>160 or DBP>90 -BP this AM was 130/65  Leukocytosis -Secondary to abscess -Mild and was trending down slightly but had an acute bump -WBC went from 18.7 -> 18.2 -> 17.9 -> 20.6 -> 20.1 -> 19.1 -C/w Abx as Above until Cx results; for now continue IV vancomycin and may be able to be transitioned to oral linezolid for at least 3 weeks from surgery -Continue to Monitor and Trend and Repeat CBC in AM   Hyponatremia -Mild as Na+ was 134 -> 139 -> 137 -> 139 -Discontinued NS at 75 mL/hr for now -Continue to Monitor and Trend -Repeat CMP in AM   Hyperglycemia -Patient's Blood Sugar on Admission was elevated at 106 and repeat this AM was 102 -Checkrf HbA1c and was 5.2 -Continue to Monitor Blood Sugars Carefully and if necessary will place on Sensitive Novolog SSI  Hypokalemia -Patient's potassium this morning was 3.7 -Continue to monitor and replete as necessary -Repeat CMP in a.m.  Normocytic Anemia -Likely dilutional drop -Patient hemoglobin/hematocrit went from 13.7/44.0 on admission is now 12.9/40.8  -Checked Anemia Panel and showed an Iron Level of 33, UIBC of 147, TIBC of 180, Saturation Ratios of 18, Ferritin Level of 191, Folate Level of 9.5 and Vitamin B12 Level of 730 -Will start Iron Polysaccharides 150 mg po Daily  -Continue to monitor for signs and symptoms of bleeding; currently no overt bleeding noted -Repeat CBC in the a.m.  Hypophosphatemia -Patient's Phos Level this AM this AM was 2.4 -Replete with IV K Phos 10 mmol -Continue to Monitor and Replete as necessary  -Repeat Phos Level in the  AM  Elevated AST -Likely reactive and will need to continue to monitor and trend and repeat CMP in a.m.  -If necessary will need to obtain a right upper quadrant ultrasound as well as an acute hepatitis Panel  DVT prophylaxis: Enoxaparin 40 mg sq q24h Code Status: FULL CODE Family Communication: No family at bedside  Disposition Plan: Anticipate D/C Back to SNF when improved and Abscess and Cellulitis is improving; patient has aspirated during this admission will need to continue treatment  Consultants:   ENT Dr. Annalee Genta  Case was discussed with infectious diseases [name]  Case was discussed with Vascular Surgery [name]   Procedures: I and D Done by EDP; Incision and Drainage of left neck abscess done by Dr. Annalee Genta on 09/04/2019   Antimicrobials:  Anti-infectives (From admission, onward)   Start     Dose/Rate Route Frequency  Ordered Stop   09/07/19 1200  metroNIDAZOLE (FLAGYL) IVPB 500 mg     500 mg 100 mL/hr over 60 Minutes Intravenous Every 8 hours 09/07/19 0908     09/07/19 1000  cefTRIAXone (ROCEPHIN) 2 g in sodium chloride 0.9 % 100 mL IVPB     2 g 200 mL/hr over 30 Minutes Intravenous Every 24 hours 09/07/19 0907     09/04/19 1800  vancomycin (VANCOCIN) IVPB 1000 mg/200 mL premix     1,000 mg 200 mL/hr over 60 Minutes Intravenous Every 24 hours 09/04/19 1159     09/03/19 2200  cefTRIAXone (ROCEPHIN) 2 g in sodium chloride 0.9 % 100 mL IVPB  Status:  Discontinued     2 g 200 mL/hr over 30 Minutes Intravenous Every 24 hours 09/03/19 1125 09/06/19 1547   09/03/19 1800  vancomycin (VANCOCIN) 1,250 mg in sodium chloride 0.9 % 250 mL IVPB  Status:  Discontinued     1,250 mg 166.7 mL/hr over 90 Minutes Intravenous Every 24 hours 09/03/19 0546 09/04/19 1159   09/03/19 1000  ceFEPIme (MAXIPIME) 2 g in sodium chloride 0.9 % 100 mL IVPB  Status:  Discontinued     2 g 200 mL/hr over 30 Minutes Intravenous Every 12 hours 09/03/19 0546 09/03/19 1125   09/02/19  2145  ceFEPIme (MAXIPIME) 2 g in sodium chloride 0.9 % 100 mL IVPB     2 g 200 mL/hr over 30 Minutes Intravenous  Once 09/02/19 2143 09/03/19 0239   09/02/19 2130  vancomycin (VANCOCIN) IVPB 1000 mg/200 mL premix     1,000 mg 200 mL/hr over 60 Minutes Intravenous  Once 09/02/19 2122 09/03/19 0004     Subjective: Seen and examined at bedside and he was very difficult to arouse this morning and somnolent.  I vigorously sternal rub him and he would wake up briefly and falls back to sleep.  No nausea or vomiting but nursing reports that he is coughing after eating yesterday.  Sound a little worse today.  Would briefly wake up but then falls right back to sleep.  No other concerns or points at this time.  Objective: Vitals:   09/07/19 0559 09/07/19 0729 09/07/19 0734 09/07/19 1433  BP: (!) 190/92   130/65  Pulse: 87   60  Resp: 18   18  Temp: 99.4 F (37.4 C)   98 F (36.7 C)  TempSrc: Oral     SpO2: 100% 99% 99% 99%  Weight:      Height:        Intake/Output Summary (Last 24 hours) at 09/07/2019 1800 Last data filed at 09/07/2019 1500 Gross per 24 hour  Intake 548.49 ml  Output 1000 ml  Net -451.51 ml   Filed Weights   09/03/19 0528  Weight: 58.9 kg   Examination: Physical Exam:  Constitutional: An elderly Caucasian male who is somnolent and encephalopathic and very difficult to arouse today but he appears to be in no acute distress sleeping Eyes: Lids normal. Briefly opened eyes  ENMT: External Ears, Nose appear normal.  Slightly hard normal hearing. Neck: Neck is wrapped but he has a Penrose drain and less cellulitic area and erythema and not as much swelling Respiratory: Diminished to auscultation bilaterally with coarse breath sounds or rhonchi more so on the right lower lobe compared to the left.  Patient was not wheezing but he is mouth breathing and is on supplemental oxygen via nasal cannula 2 L Cardiovascular: RRR, no murmurs / rubs / gallops. S1 and  S2 auscultated.  No extremity edema.  Abdomen: Soft, non-tender, non-distended.  Has a very large inguinal hernia on the right bowel sounds positive.  GU: Deferred. Musculoskeletal: No clubbing / cyanosis of digits/nails. No joint deformity upper and lower extremities.  Skin: No rashes, lesions, ulcers but does have a left neck cellulitic area which is improving and some plaque-like lesions on his right leg and is abdomen round his umbilicus. No induration; Warm and dry.  Neurologic: Patient extremely somnolent and does not follow commands; moves extremities independently Psychiatric: Impaired judgment and insight.  Patient is not alert and oriented x 3.  Somnolent and drowsy and he is sleeping  Data Reviewed: I have personally reviewed following labs and imaging studies  CBC: Recent Labs  Lab 09/02/19 1642 09/03/19 0346 09/04/19 0300 09/05/19 0322 09/06/19 0506 09/07/19 0307  WBC 18.7* 18.2* 17.9* 20.6* 20.1* 19.1*  NEUTROABS 12.2*  --  13.5* 12.8* 11.9* 12.4*  HGB 13.7 13.0 12.9* 13.9 12.9* 14.0  HCT 44.0 40.6 40.4 44.5 40.8 44.0  MCV 98.2 95.1 95.1 95.9 96.7 96.9  PLT 319 271 286 321 303 332   Basic Metabolic Panel: Recent Labs  Lab 09/03/19 0346 09/04/19 0300 09/05/19 0322 09/06/19 0506 09/07/19 0307  NA 134* 139 139 137 139  K 3.6 3.3* 3.9 3.2* 3.7  CL 99 105 104 99 103  CO2 GLUCOSE 125* 114* 109* 92 102*  BUN CREATININE 0.85 0.92 0.85 1.14 0.84  CALCIUM 8.2* 8.0* 8.5* 8.3* 8.3*  MG  --  2.0 2.1 2.3 2.2  PHOS  --  2.7 2.2* 3.8 2.4*   GFR: Estimated Creatinine Clearance: 56.5 mL/min (by C-G formula based on SCr of 0.84 mg/dL). Liver Function Tests: Recent Labs  Lab 09/03/19 0346 09/04/19 0300 09/05/19 0322 09/06/19 0506 09/07/19 0307  AST 33 49*  ALT 33  ALKPHOS 99 89 99 89 100  BILITOT 1.2 0.9 0.6 0.9 1.0  PROT 6.6 6.5 6.7 6.5 7.0  ALBUMIN 3.2* 3.0* 2.9* 2.8* 3.0*   No results for input(s): LIPASE, AMYLASE in the  last 168 hours. No results for input(s): AMMONIA in the last 168 hours. Coagulation Profile: No results for input(s): INR, PROTIME in the last 168 hours. Cardiac Enzymes: No results for input(s): CKTOTAL, CKMB, CKMBINDEX, TROPONINI in the last 168 hours. BNP (last 3 results) No results for input(s): PROBNP in the last 8760 hours. HbA1C: Recent Labs    09/05/19 0322  HGBA1C 5.2   CBG: No results for input(s): GLUCAP in the last 168 hours. Lipid Profile: No results for input(s): CHOL, HDL, LDLCALC, TRIG, CHOLHDL, LDLDIRECT in the last 72 hours. Thyroid Function Tests: No results for input(s): TSH, T4TOTAL, FREET4, T3FREE, THYROIDAB in the last 72 hours. Anemia Panel: Recent Labs    09/05/19 0322  VITAMINB12 730  FOLATE 9.5  FERRITIN 191  TIBC 180*  IRON 33*  RETICCTPCT 1.3   Sepsis Labs: No results for input(s): PROCALCITON, LATICACIDVEN in the last 168 hours.  Recent Results (from the past 240 hour(s))  Blood culture (routine x 2)     Status: None   Collection Time: 09/02/19  9:13 PM   Specimen: BLOOD  Result Value Ref Range Status   Specimen Description   Final    BLOOD LEFT ANTECUBITAL Performed at [facility], [address] [place], [place] [address]    Special Requests   Final  BOTTLES DRAWN AEROBIC AND ANAEROBIC Blood Culture results may not be optimal due to an inadequate volume of blood received in culture bottles Performed at [facility], [address] [place], [place] [address]    Culture   Final    NO GROWTH 5 DAYS Performed at [facility], [address] [place], [place] [address]    Report Status 09/07/2019 FINAL  Final  Blood culture (routine x 2)     Status: None   Collection Time: 09/02/19  9:13 PM   Specimen: BLOOD  Result Value Ref Range Status   Specimen Description   Final    BLOOD RIGHT ANTECUBITAL Performed at [facility], [address] [place], [place] [address]     Special Requests   Final    BOTTLES DRAWN AEROBIC AND ANAEROBIC Blood Culture adequate volume Performed at [facility], [address] [place], [place] [address]    Culture   Final    NO GROWTH 5 DAYS Performed at [facility], [address] [place], [place] [address]    Report Status 09/07/2019 FINAL  Final  SARS CORONAVIRUS 2 (TAT 6-24 HRS) Nasopharyngeal Nasopharyngeal Swab     Status: None   Collection Time: 09/02/19  9:13 PM   Specimen: Nasopharyngeal Swab  Result Value Ref Range Status   SARS Coronavirus 2 NEGATIVE NEGATIVE Final    Comment: (NOTE) SARS-CoV-2 target nucleic acids are NOT DETECTED. The SARS-CoV-2 RNA is generally detectable in upper and lower respiratory specimens during the acute phase of infection. Negative results do not preclude SARS-CoV-2 infection, do not rule out co-infections with other pathogens, and should not be used as the sole basis for treatment or other patient management decisions. Negative results must be combined with clinical observations, patient history, and epidemiological information. The expected result is Negative. Fact Sheet for Patients: HairSlick.nohttps://www.fda.gov/media/138098/download Fact Sheet for Healthcare Providers: quierodirigir.comhttps://www.fda.gov/media/138095/download This test is not yet approved or cleared by the [place] FDA and  has been authorized for detection and/or diagnosis of SARS-CoV-2 by FDA under an Emergency Use Authorization (EUA). This EUA will remain  in effect (meaning this test can be used) for the duration of the COVID-19 declaration under Section 56 4(b)(1) of the Act, 21 U.S.C. section 360bbb-3(b)(1), unless the authorization is terminated or revoked sooner. Performed at [facility], [address] [place], [place] [address]   MRSA PCR Screening     Status: Abnormal   Collection Time: 09/02/19 10:57 PM   Specimen: Nasal Mucosa; Nasopharyngeal  Result Value Ref Range Status    MRSA by PCR POSITIVE (A) NEGATIVE Final    Comment:        The GeneXpert MRSA Assay (FDA approved for NASAL specimens only), is one component of a comprehensive MRSA colonization surveillance program. It is not intended to diagnose MRSA infection nor to guide or monitor treatment for MRSA infections. CRITICAL RESULT CALLED TO, READ BACK BY AND VERIFIED WITH: RN V[place] AT [id number] 09/03/19 CRUICKSHANK A Performed at [facility], [address] [place], [place] [address]   Aerobic/Anaerobic Culture (surgical/deep wound)     Status: None (Preliminary result)   Collection Time: 09/04/19 11:09 AM   Specimen: PATH Other; Tissue  Result Value Ref Range Status   Specimen Description   Final    ABSCESS NECK Performed at [facility], [address] [place], [place] [address]    Special Requests   Final  NONE Performed at [facility], [address] [place], [place] [address]    Gram Stain   Final    RARE WBC PRESENT, PREDOMINANTLY PMN RARE GRAM POSITIVE COCCI Performed at [facility], [address], [place], [place] [address]    Culture   Final    MODERATE METHICILLIN RESISTANT STAPHYLOCOCCUS AUREUS NO ANAEROBES ISOLATED; CULTURE IN PROGRESS FOR 5 DAYS    Report Status PENDING  Incomplete   Organism ID, Bacteria METHICILLIN RESISTANT STAPHYLOCOCCUS AUREUS  Final      Susceptibility   Methicillin resistant staphylococcus aureus - MIC*    CIPROFLOXACIN >=8 RESISTANT Resistant     ERYTHROMYCIN >=8 RESISTANT Resistant     GENTAMICIN <=0.5 SENSITIVE Sensitive     OXACILLIN >=4 RESISTANT Resistant     TETRACYCLINE <=1 SENSITIVE Sensitive     VANCOMYCIN <=0.5 SENSITIVE Sensitive     TRIMETH/SULFA <=10 SENSITIVE Sensitive     CLINDAMYCIN <=0.25 SENSITIVE Sensitive     RIFAMPIN <=0.5 SENSITIVE Sensitive     Inducible Clindamycin NEGATIVE Sensitive     * MODERATE METHICILLIN RESISTANT STAPHYLOCOCCUS AUREUS    Radiology  Studies: Ct Angio Neck W Or Wo Contrast  Result Date: 09/06/2019 CLINICAL DATA:  Confusion. Continued difficulty after incision and drainage of LEFT neck abscess. EXAM: CT ANGIOGRAPHY NECK TECHNIQUE: Multidetector CT imaging of the neck was performed using the standard protocol during bolus administration of intravenous contrast. Multiplanar CT image reconstructions and MIPs were obtained to evaluate the vascular anatomy. Carotid stenosis measurements (when applicable) are obtained utilizing NASCET criteria, using the distal internal carotid diameter as the denominator. CONTRAST:  OMNIPAQUE IOHEXOL 350 MG/ML SOLN COMPARISON:  CT neck 09/02/2019. FINDINGS: Due to patient motion, CT angiography of the neck for assessment and quantification of stenosis is limited. Aortic arch: Extensive atheromatous change. High-grade stenosis of the proximal LEFT subclavian estimated 75-90% due to calcific and soft plaque. Three-vessel branching pattern. Right carotid system: Calcific and soft plaque in the proximal RIGHT internal carotid artery, estimated 50% stenosis. No visible dissection. Left carotid system: Calcific and soft plaque in the proximal LEFT internal carotid artery, estimated 50% or less stenosis. No visible dissection. Vertebral arteries: BILATERAL patent, RIGHT dominant. Skeleton: Spondylosis. Other neck: Improved appearance of the LEFT neck abscess following I and D. approximate cross-section 1.2 x 1.3 cm. Surrounding cellulitic change also improved. Upper chest: Reported separately. IMPRESSION: Motion degradation limits assessment for CT angiographic findings. Improved appearance of the LEFT neck abscess following surgical drainage, now just slightly over 1 cm in cross-section, all with also improved cellulitic change. Atheromatous change at the carotid bifurcations, without definite flow reducing lesion. Suspected high-grade stenosis LEFT subclavian, 75-90% estimated. Electronically Signed   By: Elsie Stain M.D.   On: 09/06/2019 19:34   Dg Chest Port 1 View  Result Date: 09/07/2019 CLINICAL DATA:  Shortness of breath EXAM: PORTABLE CHEST 1 VIEW COMPARISON:  CTA chest 09/06/2019 FINDINGS: Interstitial and airspace opacity predominantly in the right mid to infrahilar lung. No pneumothorax or visible effusion. The aorta is calcified. The remaining cardiomediastinal contours are unremarkable. No acute osseous or soft tissue abnormality. IMPRESSION: Interstitial and airspace opacity predominantly in the infrahilar lung suspicious for pneumonia or sequela of aspiration. Better seen on CT of the chest performed 09/06/2019. Electronically Signed   By: Kreg Shropshire M.D.   On: 09/07/2019 05:54   Dg Chest Port 1 View  Result Date: 09/06/2019 CLINICAL DATA:  Shortness of breath EXAM: PORTABLE CHEST 1  VIEW COMPARISON:  Yesterday FINDINGS: Continued perihilar infiltrates. No Kerley lines, effusion, or pneumothorax. Normal heart size and stable aortic tortuosity. IMPRESSION: Stable perihilar infiltrates likely reflecting pneumonia or aspiration. Electronically Signed   By: Monte Fantasia M.D.   On: 09/06/2019 07:09   Ct Angio Chest Aorta W/cm & Or Wo/cm (aka Dissection)  Result Date: 09/06/2019 CLINICAL DATA:  Shortness of breath. EXAM: CT ANGIOGRAPHY CHEST WITH CONTRAST TECHNIQUE: Multidetector CT imaging of the chest was performed using the standard protocol during bolus administration of intravenous contrast. Multiplanar CT image reconstructions and MIPs were obtained to evaluate the vascular anatomy. CONTRAST:  182mL OMNIPAQUE IOHEXOL 350 MG/ML SOLN COMPARISON:  None. FINDINGS: Cardiovascular: Evaluation is severely limited by respiratory motion artifact.Given this limitation, there is no large centrally located pulmonary embolism. Detection of smaller pulmonary emboli is not possible on this exam. The main pulmonary artery is within normal limits for size. There is no CT evidence of acute right heart  strain. There are atherosclerotic changes of the thoracic aorta. There is no thoracic aortic aneurysm. Heart size is normal, without pericardial effusion. Mediastinum/Nodes: --No mediastinal or hilar lymphadenopathy. --No axillary lymphadenopathy. --No supraclavicular lymphadenopathy. --the thyroid gland is poorly evaluated secondary to extensive motion artifact. --The esophagus is unremarkable Lungs/Pleura: There is consolidation involving the bilateral lower lobes. Debris is noted within the right mainstem bronchus. There are small bilateral pleural effusions. There is no pneumothorax. Debris is noted within the trachea. Upper Abdomen: No acute abnormality. Musculoskeletal: No chest wall abnormality. No acute or significant osseous findings. Evaluation of the lower neck is essentially nondiagnostic on the postcontrast images secondary to extensive motion artifact. Review of the MIP images confirms the above findings. IMPRESSION: 1. Very limited study secondary to extensive motion artifact. 2. No large centrally located pulmonary embolism. Detection of smaller pulmonary emboli is not possible on this exam. 3. Bilateral lower lobe consolidation concerning for multifocal pneumonia or aspiration. 4. Debris is noted within the trachea and right mainstem bronchus which may be secondary to aspiration. 5. Small bilateral pleural effusions. 6. Essentially nondiagnostic evaluation of the lower neck on the contrast-enhanced series. Aortic Atherosclerosis (ICD10-I70.0). Electronically Signed   By: Constance Holster M.D.   On: 09/06/2019 18:19   Scheduled Meds:  arformoterol  15 mcg Nebulization BID   aspirin EC  81 mg Oral Daily   budesonide (PULMICORT) nebulizer solution  0.25 mg Nebulization BID   Chlorhexidine Gluconate Cloth  6 each Topical Q0600   cholecalciferol  2,000 Units Oral Daily   enoxaparin (LOVENOX) injection  40 mg Subcutaneous QHS   ipratropium  0.5 mg Nebulization BID   iron  polysaccharides  150 mg Oral Daily   levalbuterol  0.63 mg Nebulization BID   mupirocin ointment  1 application Nasal BID   phosphorus  500 mg Oral BID   traZODone  50 mg Oral QHS   vitamin B-12  1,000 mcg Oral Daily   Continuous Infusions:  cefTRIAXone (ROCEPHIN)  IV 2 g (09/07/19 1129)   metronidazole 500 mg (09/07/19 1317)   vancomycin 1,000 mg (09/06/19 1741)    LOS: 5 days   Kerney Elbe, DO [facility] PAGER is on AMION  If 7PM-7AM, please contact night-coverage [email or web address]

## 2019-09-07 NOTE — Care Management Important Message (Signed)
Important Message  Patient Details IM Letter given to Sharren Bridge SW to present to the Patient Name: Kristopher Evans MRN: [id number] Date of Birth: 06-17-1935   Medicare Important Message Given:  Yes     Kerin Salen 09/07/2019, 10:15 AM

## 2019-09-07 NOTE — Progress Notes (Signed)
Pharmacy Antibiotic Note  Kristopher Evans is a 84 y.o. male admitted on 09/02/2019 with cellulitis of the neck.  Pharmacy was consulted for Vancomycin, cefepime dosing.  Plan: Vancomycin 1gm x1, then 1gm q24, AUC 538, using TBW, using SCr of 1 Daily SCr - 0.84  chest CT: r/o Lemierre's syndrome, Rocephin discontinued  Height: 5\' 6"  (167.6 cm) Weight: 129 lb 13.6 oz (58.9 kg) IBW/kg (Calculated) : 63.8  Temp (24hrs), Avg:98.6 F (37 C), Min:98.1 F (36.7 C), Max:99.4 F (37.4 C)  Recent Labs  Lab 09/03/19 0346 09/04/19 0300 09/05/19 0322 09/06/19 0506 09/07/19 0307  WBC 18.2* 17.9* 20.6* 20.1* 19.1*  CREATININE 0.85 0.92 0.85 1.14 0.84    Estimated Creatinine Clearance: 56.5 mL/min (by C-G formula based on SCr of 0.84 mg/dL).    Allergies  Allergen Reactions  . Penicillins     Not listed on MAR   Antimicrobials this admission: 11/18 Cefepime >> 11/19 11/18 Vancomycin  >> 11/19 Rocephin >> 11/22  Dose adjustments this admission: Vanc 1gm x1, then 1250mg  q24, AUC 542, SCr 0.85  Microbiology results: 11/18 BCx: ngtd 11/18 MRSA PCR: positive 11/20 AbscessCx: collected  Thank you for allowing pharmacy to be a part of this patient's care.  Minda Ditto PharmD 09/07/2019 7:55 AM

## 2019-09-07 NOTE — Evaluation (Signed)
SLP Cancellation Note  Patient Details Name: Kristopher Evans MRN: [id number] DOB: Aug 20, 1935   Cancelled treatment:       Reason Eval/Treat Not Completed: Fatigue/lethargy limiting ability to participate(oral care provided with oral suction with pt falling asleep within minutes of oral care, he did verbalize "yeah" to inquiry if hungry but did not follow directions, per RN - yesterday pt coughed and expectorated a large bolus of pot roast concern for asp)   Macario Golds 09/07/2019, 5:55 PM  Luanna Salk, [place] [facility] SLP [place] Pager [phone] Office [phone]

## 2019-09-08 ENCOUNTER — Inpatient Hospital Stay (HOSPITAL_COMMUNITY): Payer: PPO

## 2019-09-08 DIAGNOSIS — I771 Stricture of artery: Secondary | ICD-10-CM

## 2019-09-08 LAB — COMPREHENSIVE METABOLIC PANEL
ALT: 39 U/L (ref 0–44)
AST: 66 U/L — ABNORMAL HIGH (ref 15–41)
Albumin: 2.6 g/dL — ABNORMAL LOW (ref 3.5–5.0)
Alkaline Phosphatase: 92 U/L (ref 38–126)
Anion gap: 12 (ref 5–15)
BUN: 17 mg/dL (ref 8–23)
CO2: 25 mmol/L (ref 22–32)
Calcium: 8.4 mg/dL — ABNORMAL LOW (ref 8.9–10.3)
Chloride: 105 mmol/L (ref 98–111)
Creatinine, Ser: 0.83 mg/dL (ref 0.61–1.24)
GFR calc Af Amer: >60 mL/min (ref >60)
GFR calc non Af Amer: >60 mL/min (ref >60)
Glucose, Bld: 86 mg/dL (ref 70–99)
Potassium: 3.5 mmol/L (ref 3.5–5.1)
Sodium: 142 mmol/L (ref 135–145)
Total Bilirubin: 0.6 mg/dL (ref 0.3–1.2)
Total Protein: 6.3 g/dL — ABNORMAL LOW (ref 6.5–8.1)

## 2019-09-08 LAB — CBC WITH DIFFERENTIAL/PLATELET
Abs Immature Granulocytes: 0.3 10*3/uL — ABNORMAL HIGH (ref 0.00–0.07)
Basophils Absolute: 0.2 10*3/uL — ABNORMAL HIGH (ref 0.0–0.1)
Basophils Relative: 1 %
Eosinophils Absolute: 0.5 10*3/uL (ref 0.0–0.5)
Eosinophils Relative: 3 %
HCT: 41.8 % (ref 39.0–52.0)
Hemoglobin: 13.3 g/dL (ref 13.0–17.0)
Immature Granulocytes: 2 %
Lymphocytes Relative: 34 %
Lymphs Abs: 5.6 10*3/uL — ABNORMAL HIGH (ref 0.7–4.0)
MCH: 30.7 pg (ref 26.0–34.0)
MCHC: 31.8 g/dL (ref 30.0–36.0)
MCV: 96.5 fL (ref 80.0–100.0)
Monocytes Absolute: 1.1 10*3/uL — ABNORMAL HIGH (ref 0.1–1.0)
Monocytes Relative: 7 %
Neutro Abs: 8.8 10*3/uL — ABNORMAL HIGH (ref 1.7–7.7)
Neutrophils Relative %: 53 %
Platelets: 325 10*3/uL (ref 150–400)
RBC: 4.33 MIL/uL (ref 4.22–5.81)
RDW: 14 % (ref 11.5–15.5)
WBC: 16.4 10*3/uL — ABNORMAL HIGH (ref 4.0–10.5)
nRBC: 0 % (ref 0.0–0.2)

## 2019-09-08 LAB — MAGNESIUM: Magnesium: 2.2 mg/dL (ref 1.7–2.4)

## 2019-09-08 LAB — VANCOMYCIN, PEAK: Vancomycin Pk: 19 ug/mL — ABNORMAL LOW (ref 30–40)

## 2019-09-08 LAB — VANCOMYCIN, TROUGH: Vancomycin Tr: 10 ug/mL — ABNORMAL LOW (ref 15–20)

## 2019-09-08 LAB — PHOSPHORUS: Phosphorus: 3 mg/dL (ref 2.5–4.6)

## 2019-09-08 MED ORDER — IPRATROPIUM-ALBUTEROL 0.5-2.5 (3) MG/3ML IN SOLN
3.0000 mL | RESPIRATORY_TRACT | Status: DC | PRN
  Administered 2019-09-14: 3 mL via RESPIRATORY_TRACT
  Filled 2019-09-08: qty 3

## 2019-09-08 MED ORDER — QUETIAPINE FUMARATE 25 MG PO TABS
25.0000 mg | ORAL_TABLET | Freq: Every day | ORAL | Status: DC
  Administered 2019-09-08 – 2019-09-09 (×2): 25 mg via ORAL
  Filled 2019-09-08 (×2): qty 1

## 2019-09-08 MED ORDER — LORAZEPAM 0.5 MG PO TABS
0.5000 mg | ORAL_TABLET | Freq: Once | ORAL | Status: AC
  Administered 2019-09-08: 0.5 mg via ORAL
  Filled 2019-09-08: qty 1

## 2019-09-08 MED ORDER — VANCOMYCIN HCL 10 G IV SOLR
1500.0000 mg | INTRAVENOUS | Status: DC
  Administered 2019-09-09 – 2019-09-10 (×2): 1500 mg via INTRAVENOUS
  Filled 2019-09-08 (×2): qty 1500

## 2019-09-08 MED ORDER — HYDROXYZINE HCL 25 MG PO TABS
25.0000 mg | ORAL_TABLET | Freq: Three times a day (TID) | ORAL | Status: DC | PRN
  Administered 2019-09-09 – 2019-09-15 (×7): 25 mg via ORAL
  Filled 2019-09-08 (×7): qty 1

## 2019-09-08 NOTE — Progress Notes (Signed)
PROGRESS NOTE    Kristopher Evans  PYK:[id number]  DOB: 1935/04/21  DOA: 09/02/2019 PCP: Patient, No Pcp Per  Brief Narrative:  84 year old male with history of dementia, presented from SNF on 11/18 with fever and left posterior auricular/neck swelling x 3 days. On presentation, temp 99.2, blood pressure 196/84, pulse 108 and respiratory rate 18 with O2 sat 98% on room air.  White count was 18 point 7K.  CT neck showed 3 cm left neck abscess and surrounding cellulitis with mildly enlarged lymph nodes. I&D attempted in the ER and admitted to [facility] service with IV antibiotics-vancomycin/cefepime.   Hospital course: During the hospital stay patient's antibiotics were titrated per culture results.  Wound cultures grew moderate MRSA - sensitive to clindamycin/Bactrim and tetracycline as well.  Given persistent induration and purulent drainage with rising white count (peaked to 20K), ENT was formally consulted and patient underwent I&D in the OR on 11/20.  Patient has been placed on tramadol for pain control. Hospital course has been complicated by delirium requiring soft mittens/Haldol, mild hyponatremia, hypokalemia acute hypoxic respiratory failure on 11/22 requiring 2 L of O2 presumed due to fluid overload.  IV fluids were discontinued and patient received IV Lasix.  Chest x-ray reported perihilar infiltrates likely reflecting aspiration pneumonia.  Speech therapy was consulted to rule out aspiration.  After discussing with ID, prior rounding hospitalist ordered CTA of the chest/CTA of the neck were ordered to rule out Lemierre's syndrome.  CTA chest 11/22 was negative for PE but showed bilateral lower lobe consolidations along with debris in trachea/right mainstem bronchus suggesting aspiration as well as small pleural effusions.  CTA neck 11/22 showed improvement in neck abscess and reduced carotid flow with high-grade left subclavian stenosis suspected to be 75 to 90%.  Patient currently on IV  ceftriaxone/vancomycin and Flagyl.  Subjective:  Care resumed by me today.  Patient disoriented but appears calm.  Per nurse, tends to sleep more during the daytime and is awake at night.  T-max 99.6.  I's and O's show net out 700 mL.  Leukocytosis downtrending.  Electrolytes corrected on labs (sodium, potassium, phosphorus)  Objective: Vitals:   09/07/19 2142 09/08/19 0432 09/08/19 0831 09/08/19 0834  BP: 134/60 (!) 159/77    Pulse: 69 74    Resp: 16 16    Temp: 99.6 F (37.6 C) 99.2 F (37.3 C)    TempSrc: Oral Oral    SpO2: 96% 100% 98% 98%  Weight:      Height:        Intake/Output Summary (Last 24 hours) at 09/08/2019 1000 Last data filed at 09/08/2019 0842 Gross per 24 hour  Intake 640.03 ml  Output 1352 ml  Net -711.97 ml   Filed Weights   09/03/19 0528  Weight: 58.9 kg    Physical Examination:  General exam: Appears confused, but comfortable  Respiratory system: Clear to auscultation. Respiratory effort normal. Cardiovascular system: S1 & S2 heard, RRR. No JVD, murmurs, rubs, gallops or clicks. No pedal edema. Gastrointestinal system: Abdomen is nondistended, soft and nontender. No organomegaly or masses felt. Normal bowel sounds heard. Central nervous system: Sleeping comfortably, easily arousable but prefers to keep his eyes closed.  Advanced dementia with confusion.  Moves all extremities.  Extremities: No contractures, edema or joint deformities.  Skin: No rashes, lesions or ulcers Psychiatry: Judgement and insight appear normal. Mood & affect appropriate.   Data Reviewed: I have personally reviewed following labs and imaging studies  CBC: Recent Labs  Lab 09/04/19  0300 09/05/19 0322 09/06/19 0506 09/07/19 0307 09/08/19 0358  WBC 17.9* 20.6* 20.1* 19.1* 16.4*  NEUTROABS 13.5* 12.8* 11.9* 12.4* 8.8*  HGB 12.9* 13.9 12.9* 14.0 13.3  HCT 40.4 44.5 40.8 44.0 41.8  MCV 95.1 95.9 96.7 96.9 96.5  PLT 286 321 303 332 325   Basic Metabolic  Panel: Recent Labs  Lab 09/04/19 0300 09/05/19 0322 09/06/19 0506 09/07/19 0307 09/08/19 0358  NA 139 139 137 139 142  K 3.3* 3.9 3.2* 3.7 3.5  CL 105 104 99 103 105  CO2 GLUCOSE 114* 109* 92 102* 86  BUN CREATININE 0.92 0.85 1.14 0.84 0.83  CALCIUM 8.0* 8.5* 8.3* 8.3* 8.4*  MG 2.0 2.1 2.3 2.2 2.2  PHOS 2.7 2.2* 3.8 2.4* 3.0   GFR: Estimated Creatinine Clearance: 57.2 mL/min (by C-G formula based on SCr of 0.83 mg/dL). Liver Function Tests: Recent Labs  Lab 09/04/19 0300 09/05/19 0322 09/06/19 0506 09/07/19 0307 09/08/19 0358  AST 49* 66*  ALT 33 39  ALKPHOS 89 99 89 100 92  BILITOT 0.9 0.6 0.9 1.0 0.6  PROT 6.5 6.7 6.5 7.0 6.3*  ALBUMIN 3.0* 2.9* 2.8* 3.0* 2.6*   No results for input(s): LIPASE, AMYLASE in the last 168 hours. No results for input(s): AMMONIA in the last 168 hours. Coagulation Profile: No results for input(s): INR, PROTIME in the last 168 hours. Cardiac Enzymes: No results for input(s): CKTOTAL, CKMB, CKMBINDEX, TROPONINI in the last 168 hours. BNP (last 3 results) No results for input(s): PROBNP in the last 8760 hours. HbA1C: No results for input(s): HGBA1C in the last 72 hours. CBG: No results for input(s): GLUCAP in the last 168 hours. Lipid Profile: No results for input(s): CHOL, HDL, LDLCALC, TRIG, CHOLHDL, LDLDIRECT in the last 72 hours. Thyroid Function Tests: No results for input(s): TSH, T4TOTAL, FREET4, T3FREE, THYROIDAB in the last 72 hours. Anemia Panel: No results for input(s): VITAMINB12, FOLATE, FERRITIN, TIBC, IRON, RETICCTPCT in the last 72 hours. Sepsis Labs: No results for input(s): PROCALCITON, LATICACIDVEN in the last 168 hours.  Recent Results (from the past 240 hour(s))  Blood culture (routine x 2)     Status: None   Collection Time: 09/02/19  9:13 PM   Specimen: BLOOD  Result Value Ref Range Status   Specimen Description   Final    BLOOD LEFT ANTECUBITAL Performed  at [facility], [address] [place], [place] [address]    Special Requests   Final    BOTTLES DRAWN AEROBIC AND ANAEROBIC Blood Culture results may not be optimal due to an inadequate volume of blood received in culture bottles Performed at [facility], [address] [place], [place] [address]    Culture   Final    NO GROWTH 5 DAYS Performed at [facility], [address], [place], [place] [address]    Report Status 09/07/2019 FINAL  Final  Blood culture (routine x 2)     Status: None   Collection Time: 09/02/19  9:13 PM   Specimen: BLOOD  Result Value Ref Range Status   Specimen Description   Final    BLOOD RIGHT ANTECUBITAL Performed at [facility], [address], [place], [place] [address]    Special Requests   Final    BOTTLES DRAWN AEROBIC AND ANAEROBIC Blood Culture adequate volume Performed at [facility], [address], [place], [place] [address]    Culture   Final    NO GROWTH 5 DAYS Performed at [facility], [place] [address] [place], [place] [address]    Report Status 09/07/2019 FINAL  Final  SARS CORONAVIRUS 2 (TAT 6-24 HRS) Nasopharyngeal Nasopharyngeal Swab     Status: None   Collection Time: 09/02/19  9:13 PM   Specimen: Nasopharyngeal Swab  Result Value Ref Range Status   SARS Coronavirus 2 NEGATIVE NEGATIVE Final    Comment: (NOTE) SARS-CoV-2 target nucleic acids are NOT DETECTED. The SARS-CoV-2 RNA is generally detectable in upper and lower respiratory specimens during the acute phase of infection. Negative results do not preclude SARS-CoV-2 infection, do not rule out co-infections with other pathogens, and should not be used as the sole basis for treatment or other patient management decisions. Negative results must be combined with clinical observations, patient history, and epidemiological information. The expected result is Negative. Fact Sheet for  Patients: SugarRoll.be Fact Sheet for Healthcare Providers: https://www.woods-mathews.com/ This test is not yet approved or cleared by the [place] FDA and  has been authorized for detection and/or diagnosis of SARS-CoV-2 by FDA under an Emergency Use Authorization (EUA). This EUA will remain  in effect (meaning this test can be used) for the duration of the COVID-19 declaration under Section 56 4(b)(1) of the Act, 21 U.S.C. section 360bbb-3(b)(1), unless the authorization is terminated or revoked sooner. Performed at [facility], [place] [address] [place], [place] [address]   MRSA PCR Screening     Status: Abnormal   Collection Time: 09/02/19 10:57 PM   Specimen: Nasal Mucosa; Nasopharyngeal  Result Value Ref Range Status   MRSA by PCR POSITIVE (A) NEGATIVE Final    Comment:        The GeneXpert MRSA Assay (FDA approved for NASAL specimens only), is one component of a comprehensive MRSA colonization surveillance program. It is not intended to diagnose MRSA infection nor to guide or monitor treatment for MRSA infections. CRITICAL RESULT CALLED TO, READ BACK BY AND VERIFIED WITH: RN VIRGINIA AT 2952 09/03/19 CRUICKSHANK A Performed at [facility], [place] [address] [place], [place] [address]   Aerobic/Anaerobic Culture (surgical/deep wound)     Status: None (Preliminary result)   Collection Time: 09/04/19 11:09 AM   Specimen: PATH Other; Tissue  Result Value Ref Range Status   Specimen Description   Final    ABSCESS NECK Performed at [facility], [place] [address], [place], [place] [address]    Special Requests   Final    NONE Performed at [facility], [place] [address], [place], [place] [address]    Gram Stain   Final    RARE WBC PRESENT, PREDOMINANTLY PMN RARE GRAM POSITIVE COCCI Performed at [facility], [place] [address] [place], [place] [address]    Culture   Final     MODERATE METHICILLIN RESISTANT STAPHYLOCOCCUS AUREUS NO ANAEROBES ISOLATED; CULTURE IN PROGRESS FOR 5 DAYS    Report Status PENDING  Incomplete   Organism ID, Bacteria METHICILLIN RESISTANT STAPHYLOCOCCUS AUREUS  Final      Susceptibility   Methicillin resistant staphylococcus aureus - MIC*    CIPROFLOXACIN >=8 RESISTANT Resistant     ERYTHROMYCIN >=8 RESISTANT Resistant     GENTAMICIN <=0.5 SENSITIVE Sensitive     OXACILLIN >=4 RESISTANT Resistant     TETRACYCLINE <=1 SENSITIVE Sensitive     VANCOMYCIN <=0.5 SENSITIVE Sensitive  TRIMETH/SULFA <=10 SENSITIVE Sensitive     CLINDAMYCIN <=0.25 SENSITIVE Sensitive     RIFAMPIN <=0.5 SENSITIVE Sensitive     Inducible Clindamycin NEGATIVE Sensitive     * MODERATE METHICILLIN RESISTANT STAPHYLOCOCCUS AUREUS      Radiology Studies: Ct Angio Neck W Or Wo Contrast  Result Date: 09/06/2019 CLINICAL DATA:  Confusion. Continued difficulty after incision and drainage of LEFT neck abscess. EXAM: CT ANGIOGRAPHY NECK TECHNIQUE: Multidetector CT imaging of the neck was performed using the standard protocol during bolus administration of intravenous contrast. Multiplanar CT image reconstructions and MIPs were obtained to evaluate the vascular anatomy. Carotid stenosis measurements (when applicable) are obtained utilizing NASCET criteria, using the distal internal carotid diameter as the denominator. CONTRAST:  OMNIPAQUE IOHEXOL 350 MG/ML SOLN COMPARISON:  CT neck 09/02/2019. FINDINGS: Due to patient motion, CT angiography of the neck for assessment and quantification of stenosis is limited. Aortic arch: Extensive atheromatous change. High-grade stenosis of the proximal LEFT subclavian estimated 75-90% due to calcific and soft plaque. Three-vessel branching pattern. Right carotid system: Calcific and soft plaque in the proximal RIGHT internal carotid artery, estimated 50% stenosis. No visible dissection. Left carotid system: Calcific and soft plaque in  the proximal LEFT internal carotid artery, estimated 50% or less stenosis. No visible dissection. Vertebral arteries: BILATERAL patent, RIGHT dominant. Skeleton: Spondylosis. Other neck: Improved appearance of the LEFT neck abscess following I and D. approximate cross-section 1.2 x 1.3 cm. Surrounding cellulitic change also improved. Upper chest: Reported separately. IMPRESSION: Motion degradation limits assessment for CT angiographic findings. Improved appearance of the LEFT neck abscess following surgical drainage, now just slightly over 1 cm in cross-section, all with also improved cellulitic change. Atheromatous change at the carotid bifurcations, without definite flow reducing lesion. Suspected high-grade stenosis LEFT subclavian, 75-90% estimated. Electronically Signed   By: Elsie Stain M.D.   On: 09/06/2019 19:34   Dg Chest Port 1 View  Result Date: 09/08/2019 CLINICAL DATA:  Shortness of breath EXAM: PORTABLE CHEST 1 VIEW COMPARISON:  Yesterday FINDINGS: Normal heart size and mediastinal contours. Increased perihilar markings is stable to improved and correlates with airspace/atelectasis disease by chest CT. Stable exam. IMPRESSION: Borderline improvement in perihilar airspace disease. Electronically Signed   By: Marnee Spring M.D.   On: 09/08/2019 07:32   Dg Chest Port 1 View  Result Date: 09/07/2019 CLINICAL DATA:  Shortness of breath EXAM: PORTABLE CHEST 1 VIEW COMPARISON:  CTA chest 09/06/2019 FINDINGS: Interstitial and airspace opacity predominantly in the right mid to infrahilar lung. No pneumothorax or visible effusion. The aorta is calcified. The remaining cardiomediastinal contours are unremarkable. No acute osseous or soft tissue abnormality. IMPRESSION: Interstitial and airspace opacity predominantly in the infrahilar lung suspicious for pneumonia or sequela of aspiration. Better seen on CT of the chest performed 09/06/2019. Electronically Signed   By: Kreg Shropshire M.D.   On:  09/07/2019 05:54   Ct Angio Chest Aorta W/cm & Or Wo/cm (aka Dissection)  Result Date: 09/06/2019 CLINICAL DATA:  Shortness of breath. EXAM: CT ANGIOGRAPHY CHEST WITH CONTRAST TECHNIQUE: Multidetector CT imaging of the chest was performed using the standard protocol during bolus administration of intravenous contrast. Multiplanar CT image reconstructions and MIPs were obtained to evaluate the vascular anatomy. CONTRAST:  OMNIPAQUE IOHEXOL 350 MG/ML SOLN COMPARISON:  None. FINDINGS: Cardiovascular: Evaluation is severely limited by respiratory motion artifact.Given this limitation, there is no large centrally located pulmonary embolism. Detection of smaller pulmonary emboli is not possible on this exam. The main  pulmonary artery is within normal limits for size. There is no CT evidence of acute right heart strain. There are atherosclerotic changes of the thoracic aorta. There is no thoracic aortic aneurysm. Heart size is normal, without pericardial effusion. Mediastinum/Nodes: --No mediastinal or hilar lymphadenopathy. --No axillary lymphadenopathy. --No supraclavicular lymphadenopathy. --the thyroid gland is poorly evaluated secondary to extensive motion artifact. --The esophagus is unremarkable Lungs/Pleura: There is consolidation involving the bilateral lower lobes. Debris is noted within the right mainstem bronchus. There are small bilateral pleural effusions. There is no pneumothorax. Debris is noted within the trachea. Upper Abdomen: No acute abnormality. Musculoskeletal: No chest wall abnormality. No acute or significant osseous findings. Evaluation of the lower neck is essentially nondiagnostic on the postcontrast images secondary to extensive motion artifact. Review of the MIP images confirms the above findings. IMPRESSION: 1. Very limited study secondary to extensive motion artifact. 2. No large centrally located pulmonary embolism. Detection of smaller pulmonary emboli is not possible on this  exam. 3. Bilateral lower lobe consolidation concerning for multifocal pneumonia or aspiration. 4. Debris is noted within the trachea and right mainstem bronchus which may be secondary to aspiration. 5. Small bilateral pleural effusions. 6. Essentially nondiagnostic evaluation of the lower neck on the contrast-enhanced series. Aortic Atherosclerosis (ICD10-I70.0). Electronically Signed   By: Katherine Mantlehristopher  Green M.D.   On: 09/06/2019 18:19        Scheduled Meds:  arformoterol  15 mcg Nebulization BID   aspirin EC  81 mg Oral Daily   budesonide (PULMICORT) nebulizer solution  0.25 mg Nebulization BID   cholecalciferol  2,000 Units Oral Daily   enoxaparin (LOVENOX) injection  40 mg Subcutaneous QHS   ipratropium  0.5 mg Nebulization BID   iron polysaccharides  150 mg Oral Daily   levalbuterol  0.63 mg Nebulization BID   LORazepam  0.5 mg Oral Once   vitamin B-12  1,000 mcg Oral Daily   Continuous Infusions:  cefTRIAXone (ROCEPHIN)  IV 2 g (09/07/19 1129)   metronidazole 500 mg (09/08/19 0411)   vancomycin 1,000 mg (09/07/19 1812)    Assessment & Plan:   1.  Left neck abscess: S/p I&D by ENT.  Leukocytosis improving.  Afebrile.  Patient admitted on 11/18 with IV vancomycin/cefepime.  Cefepime changed to Rocephin on 11/19.  Flagyl appears to have been added on 11/23 for problem #2.  Wound cultures growing MRSA.  Continue IV vancomycin, pharmacy following trough/peak levels.  Leukocytosis downtrending.  Blood cultures negative.  2.  Acute hypoxic respiratory failure secondary to aspiration pneumonitis/tracheitis: CT chest findings consistent with bibasilar consolidations and tracheal smudge suggesting aspiration.  Patient definitely at risk given advanced dementia.  Seen by speech therapy and underwent modified barium swallow study today.  Speech therapy recommended dysphagia 2 diet with thin liquids.  No straws.  They will continue to follow along.  Patient on IV Flagyl in addition  to vancomycin/Rocephin.  Patient likely does not need both Rocephin and Flagyl to cover for aspiration organisms-we will discuss with clinical pharmacy.  Continue neb treatments as needed and scheduled.  Discontinue Brovana/Pulmicort. Okay to continue mucolytics.  O2 via nasal cannula to be titrated as needed  3.  Dementia/Delirium: In the setting of problem #1 and #2.  Per nurse patient sleeping mostly during daytime and awake/agitated at night.  He apparently did not respond well to Haldol x1.  Noted that he received Ativan x1 overnight.  Would avoid benzodiazepine in this elderly patient with advanced dementia.  Will add low-dose Seroquel  at bedtime and obtain EKG in a.m--can change to Remeron if QTC prolonged..  Can utilize hydroxyzine as needed for anxiety/sleep.  Conservative measures/delirium precautions ordered  4.  Left subclavian stenosis: CT neck which was obtained to rule out Lemierre syndrome did not show any jugular vein thrombosis but revealed critical left subclavian stenosis 75 to 90% per radiology report.  Called and discussed with vascular surgery Dr. Edilia Bo who recommended conservative management with aspirin.  Per vascular surgery, can consider obtaining carotid duplex to assess for subclavian steal syndrome/reversal of carotid blood flow if patient complains of dizziness (unlikely to ever know in this patient with advanced dementia).  Avoid blood pressure checks in left arm as can be falsely low.  Lipid profile in a.m.  5. Hypokalemia/hyponatremia: Now resolved.  Not on IV fluids currently.  6.  Hypophosphatemia: Repleted IV, now normalized   DVT prophylaxis: Lovenox Code Status: DNR Family / Patient Communication: No family present at bedside Disposition Plan: Patient from a facility/dementia unit-can likely return there.  Consider palliative care evaluation for goals of care in a.m.     LOS: 6 days    Time spent: 76 Minutes    Alessandra Bevels, MD [facility] Pager [phone]  If 7PM-7AM, please contact night-coverage [email or web address] Password [email or web address] 09/08/2019, 10:00 AM

## 2019-09-08 NOTE — Evaluation (Signed)
Clinical/Bedside Swallow Evaluation Patient Details  Name: Kristopher Evans MRN: [id number] Date of Birth: 07/27/1935  Today's Date: 09/08/2019 Time: SLP Start Time (ACUTE ONLY): 0935 SLP Stop Time (ACUTE ONLY): 1010 SLP Time Calculation (min) (ACUTE ONLY): 35 min  Past Medical History:  Past Medical History:  Diagnosis Date  . Dementia [facility])    Past Surgical History:  Past Surgical History:  Procedure Laterality Date  . MINOR IRRIGATION AND DEBRIDEMENT OF WOUND Left 09/04/2019   Procedure: IRRIGATION AND DEBRIDEMENT OF NECK ABCESS;  Surgeon: Osborn Coho, MD;  Location: [facility] ORS;  Service: ENT;  Laterality: Left;   HPI:  84 yo male adm to [facility] with left neck abscess.  He has h/o dementia, and is s/p I &D in OR with ENT on 09/04/2019.  Per RN, pt tolerated po well until the weekend when he likely aspirated pot roast.  Last night SLP attempted visit but pt was lehtargic.  11/24 he will fully alert.  Pt CXR showed bilateral LL consolidation - debris in mainstem bronchus and trachea secondary to aspiration.  Per imaging report - on 11/18, pt's pharynx and larynx were not swollen and airway was parent.   Assessment / Plan / Recommendation Clinical Impression  Patient is demonstrating subjective indication of pharyngeal and possibly pharyngo-esophageal dysphagia with concerns for aspiration.  SLP questions it pt may have some secretions and/or edema in pharynx impacting his swallowing.  Pt kept stating "I'm alright" after coughing with intake but denies coughing occuring prior to admit.  He subjectively tolerates tsps of thin and puree.  Will conduct MBS to instrumentally evaluation oropharyngeal swallow.  Planned today at approximately noon.  RN, pt and xray informed.  Thanks for this consult. SLP Visit Diagnosis: Dysphagia, pharyngeal phase (R13.13)    Aspiration Risk  Moderate aspiration risk    Diet Recommendation (po medication with applesauce only)        Other  Recommendations   MBS   Follow up Recommendations   TBD     Frequency and Duration   TBD         Prognosis   guarded     Swallow Study   General Date of Onset: 09/08/19 HPI: 84 yo male adm to [facility] with left neck abscess.  He has h/o dementia, and is s/p I &D in OR with ENT on 09/04/2019.  Per RN, pt tolerated po well until the weekend when he likely aspirated pot roast.  Last night SLP attempted visit but pt was lehtargic.  11/24 he will fully alert.  Pt CXR showed bilateral LL consolidation - debris in mainstem bronchus and trachea secondary to aspiration.  Per imaging report - on 11/18, pt's pharynx and larynx were not swollen and airway was parent. Type of Study: Bedside Swallow Evaluation Diet Prior to this Study: Regular;Thin liquids Temperature Spikes Noted: No Respiratory Status: Room air History of Recent Intubation: No Behavior/Cognition: Alert;Cooperative;Pleasant mood Oral Cavity Assessment: Within Functional Limits Oral Care Completed by SLP: No Oral Cavity - Dentition: Other (Comment);Dentures, top(few lower dentition, upper plate) Vision: Functional for self-feeding Self-Feeding Abilities: Able to feed self Patient Positioning: Upright in bed Baseline Vocal Quality: Other (comment)(varies from "gurgly" to clear) Volitional Cough: Weak Volitional Swallow: Able to elicit(with delay)    Oral/Motor/Sensory Function Overall Oral Motor/Sensory Function: Generalized oral weakness   Ice Chips Ice chips: Not tested   Thin Liquid Thin Liquid: Impaired Presentation: Cup;Spoon;Self Fed Pharyngeal  Phase Impairments: Cough - Delayed Other Comments: tsps tolerated without indication of aspiration  Nectar Thick Nectar Thick Liquid: Not tested   Honey Thick Honey Thick Liquid: Not tested   Puree Puree: Impaired Presentation: Self Fed;Spoon Pharyngeal Phase Impairments: Cough - Delayed   Solid     Solid: Impaired Presentation: Self Fed Oral Phase Impairments: Reduced lingual  movement/coordination;Impaired mastication Oral Phase Functional Implications: Impaired mastication Pharyngeal Phase Impairments: Cough - Immediate Other Comments: cough before and after swallowing= suspect premature spillage into airway before swallow      Macario Golds 09/08/2019,10:10 AM   Luanna Salk, MS [place] Pager [phone] Office [phone]

## 2019-09-08 NOTE — Progress Notes (Signed)
Modified Barium Swallow Progress Note  Patient Details  Name: OSWELL SAY MRN: [id number] Date of Birth: 02/24/1935  Today's Date: 09/08/2019  Modified Barium Swallow completed.  Full report located under Chart Review in the Imaging Section.  Brief recommendations include the following:  Clinical Impression  Patient presents with mild-moderate oropharyngeal dysphagia with decreased mastication ability of hard boluses *eg poor mastication of barium tablet when he attempted* suspect illl fitting dentures impacting function.  He demonstrated functional oral transiting with liquids, soft solids and purees.  Pharyngeal swallow c/b sensorimotor deficits with decreased tongue base retraction,pharyngeal constriction resulting in vallecular residuals across all boluses without awareness.  Postural changes - head turn right/left, chin tuck were not helpful to prevent.  Cued STRONG swallow was helpful with solids as well as drinking liquids after swallow.  Pt did cough and expectorate bolus residuals from vallecular region with max cues and advise he continue this strategy.  Two episodes of mild aspiration with thin liquids due to decreased timing of laryngeal closure with delayed cough response - bolus was too far into trachea to clear.  Pt will benefit from full supervision as he takes very LARGE boluses despite verbal/visual cues to small boluses.    Upon esophgeal sweep, pt appears with residuals without awareness.  Recommend very strict aspiration/esophageal precautions.    Dys2/thin diet recommended at this time to mitigate aspiration risk.  SLP will follow up for education, compensation strategies and strengthening.  Thanks for this consult.   Of note, pt did NOT cough during MBS when he did not aspirate - cough x2 only with thin aspiration.   Swallow Evaluation Recommendations   Recommended Consults: Consider esophageal assessment   SLP Diet Recommendations: Dysphagia 2 (Fine chop) solids;Thin  liquid   Liquid Administration via: Cup;No straw(NO STRAW)       Supervision: Full assist for feeding;Full supervision/cueing for compensatory strategies   Compensations: Slow rate;Small sips/bites;Follow solids with liquid(EFFORTFUL SWALLOW)  Cough/"Hock" to clear pharyngeal retention.    Postural Changes: Remain semi-upright after after feeds/meals (Comment);Seated upright at 90 degrees   Oral Care Recommendations: Oral care QID       Luanna Salk, MS [facility] SLP [place] Pager [phone] Office [phone]  Macario Golds 09/08/2019,1:04 PM

## 2019-09-08 NOTE — Progress Notes (Signed)
Rx Brief note: Vancomycin  See 11/24  Note by Karel Jarvis, PharmD for details  Asessement:  Vtr= 10, Vpk=19 AUC estimated to be 343 on current regimen of 1 Gm IV q24h.   Plan:   Will increase Vancomycin to 1500 mg IV q24h for est AUC = 515 Goal AUC = 400-550 F/u scr/cultures/levels as  Needed   Thanks Dorrene German 09/08/2019 10:03 PM

## 2019-09-08 NOTE — Progress Notes (Signed)
Pharmacy Antibiotic Note  Kristopher Evans is a 84 y.o. male admitted on 09/02/2019 with cellulitis of the neck.  Pharmacy was consulted for Vancomycin, cefepime dosing.  Plan: Obtain Vanc trough prior to 1800 dose today Vancomycin 1gm x1, adj to 1gm q24, AUC 538, using TBW, using SCr of 1 Daily SCr - 0.83 Resumed CTX 2gm q24, added Flagyl 500mg  IV q8 for presumed aspiration PNA  chest CT: ruled out Lemierre's syndrome, Rocephin discontinued  Height: 5\' 6"  (167.6 cm) Weight: 129 lb 13.6 oz (58.9 kg) IBW/kg (Calculated) : 63.8  Temp (24hrs), Avg:98.9 F (37.2 C), Min:98 F (36.7 C), Max:99.6 F (37.6 C)  Recent Labs  Lab 09/04/19 0300 09/05/19 0322 09/06/19 0506 09/07/19 0307 09/08/19 0358  WBC 17.9* 20.6* 20.1* 19.1* 16.4*  CREATININE 0.92 0.85 1.14 0.84 0.83    Estimated Creatinine Clearance: 57.2 mL/min (by C-G formula based on SCr of 0.83 mg/dL).    Allergies  Allergen Reactions  . Penicillins     Not listed on MAR   Antimicrobials this admission: 11/18 Cefepime >> 11/19 11/18 Vancomycin  >> 11/19 Rocephin >> 11/22  Dose adjustments this admission: Vanc 1gm x1, then 1250mg  q24, AUC 542, SCr 0.85  Microbiology results: 11/18 BCx: ngtd 11/18 MRSA PCR: positive 11/20 AbscessCx: MRSA  Thank you for allowing pharmacy to be a part of this patient's care.  Minda Ditto PharmD 09/08/2019 2:01 PM

## 2019-09-09 DIAGNOSIS — R627 Adult failure to thrive: Secondary | ICD-10-CM

## 2019-09-09 DIAGNOSIS — F0150 Vascular dementia without behavioral disturbance: Secondary | ICD-10-CM

## 2019-09-09 DIAGNOSIS — L89311 Pressure ulcer of right buttock, stage 1: Secondary | ICD-10-CM

## 2019-09-09 LAB — CBC
HCT: 38 % — ABNORMAL LOW (ref 39.0–52.0)
Hemoglobin: 12 g/dL — ABNORMAL LOW (ref 13.0–17.0)
MCH: 30.8 pg (ref 26.0–34.0)
MCHC: 31.6 g/dL (ref 30.0–36.0)
MCV: 97.4 fL (ref 80.0–100.0)
Platelets: 293 10*3/uL (ref 150–400)
RBC: 3.9 MIL/uL — ABNORMAL LOW (ref 4.22–5.81)
RDW: 14.1 % (ref 11.5–15.5)
WBC: 14.9 10*3/uL — ABNORMAL HIGH (ref 4.0–10.5)
nRBC: 0 % (ref 0.0–0.2)

## 2019-09-09 LAB — BASIC METABOLIC PANEL
Anion gap: 7 (ref 5–15)
BUN: 17 mg/dL (ref 8–23)
CO2: 28 mmol/L (ref 22–32)
Calcium: 8 mg/dL — ABNORMAL LOW (ref 8.9–10.3)
Chloride: 106 mmol/L (ref 98–111)
Creatinine, Ser: 0.97 mg/dL (ref 0.61–1.24)
GFR calc Af Amer: >60 mL/min (ref >60)
GFR calc non Af Amer: >60 mL/min (ref >60)
Glucose, Bld: 93 mg/dL (ref 70–99)
Potassium: 3 mmol/L — ABNORMAL LOW (ref 3.5–5.1)
Sodium: 141 mmol/L (ref 135–145)

## 2019-09-09 LAB — AEROBIC/ANAEROBIC CULTURE W GRAM STAIN (SURGICAL/DEEP WOUND)

## 2019-09-09 LAB — LIPID PANEL
Cholesterol: 113 mg/dL (ref 0–200)
HDL: 31 mg/dL — ABNORMAL LOW (ref >40)
LDL Cholesterol: 73 mg/dL (ref 0–99)
Total CHOL/HDL Ratio: 3.6 RATIO
Triglycerides: 45 mg/dL (ref <150)
VLDL: 9 mg/dL (ref 0–40)

## 2019-09-09 MED ORDER — POTASSIUM CHLORIDE 20 MEQ/15ML (10%) PO SOLN
40.0000 meq | ORAL | Status: AC
  Administered 2019-09-09 – 2019-09-10 (×2): 40 meq via ORAL
  Filled 2019-09-09 (×4): qty 30

## 2019-09-09 MED ORDER — LIDOCAINE 5 % EX PTCH
1.0000 | MEDICATED_PATCH | CUTANEOUS | Status: DC
  Administered 2019-09-09 – 2019-09-14 (×5): 1 via TRANSDERMAL
  Filled 2019-09-09 (×7): qty 1

## 2019-09-09 NOTE — Care Management Important Message (Signed)
Important Message  Patient Details IM Letter given to Sharren Bridge SW to present to the Patient Name: Kristopher Evans MRN: [id number] Date of Birth: 1935-05-02   Medicare Important Message Given:  Yes     Kerin Salen 09/09/2019, 10:28 AM

## 2019-09-09 NOTE — Progress Notes (Signed)
PROGRESS NOTE  Kristopher Evans OEH:[id number] DOB: Nov 12, 1934 DOA: 09/02/2019 PCP: Patient, No Pcp Per  HPI/Recap of past 24 hours:  He is alert, with baseline dementia, he is calm currently No fever, vital signs are stable  Assessment/Plan: Principal Problem:   Neck abscess Active Problems:   Dementia (HCC)   HTN (hypertension)   Leucocytosis   Pressure injury of skin   Hypokalemia   Normocytic anemia   Acute respiratory failure with hypoxia (HCC)   Hypophosphatemia  Left neck abscess -He was sent from memory care unit to the ED due to left neck abscess, has significant leukocytosis on presentation -Blood culture no growth -ENT consulted, patient underwent I&D in OR on November 20th, wound culture growing MRSA -He is on IV vancomycin, with plan transition to doxycycline at discharge   Acute hypoxic respiratory failure secondary to aspiration pneumonitis tracheitis -Is currently on Rocephin and Flagyl for this -Diet recommendation per speech as below Diet recommendations: Dysphagia 2 (fine chop);Thin liquid Liquids provided via: Cup;No straw Medication Administration: Whole meds with puree Supervision: Staff to assist with self feeding;Full supervision/cueing for compensatory strategies Compensations: Slow rate;Small sips/bites;Follow solids with liquid(effortful swallow) Postural Changes and/or Swallow Maneuvers: Seated upright 90 degrees;Upright 30-60 min after meal  electrolyte abnormalities including hypokalemia, hyponatremia, hypophosphatemia Sodium and Phos  has normalized K remain low, continue replace, recheck in a.m.  Left subclavian stenosis: CT neck which was obtained to rule out Lemierre syndrome did not show any jugular vein thrombosis but revealed critical left subclavian stenosis 75 to 90% per radiology report.    By hospitalist colleague Dr. Lajuana Ripple called and discussed with vascular surgery Dr. Edilia Bo who recommended conservative management with aspirin.   Per vascular surgery, can consider obtaining carotid duplex to assess for subclavian steal syndrome/reversal of carotid blood flow if patient complains of dizziness (unlikely to ever know in this patient with advanced dementia).  Avoid blood pressure checks in left arm as can be falsely low.  Lipid profile hdl 31, ldl 73  Delirium with history of dementia -He did not respond to Haldol per previous report -he is currently on low-dose Seroquel at nighttime, will monitor QTC -We will avoid benzo -Seems to be calm today  Stage I pressure ulcer right buttock presented on admission  Failure to thrive,  He is from memory care unit, will order physical therapy  DVT Prophylaxis:  Code Status: DNR  Family Communication: patient   Disposition Plan: Likely will return to memory care unit with home health   Consultants:  ENT  Vascular surgery  Procedures:  Left neck abscess I&D in OR by ENT  Antibiotics:  As above   Objective: BP (!) 144/75 (BP Location: Right Arm)   Pulse 66   Temp 98.2 F (36.8 C) (Oral)   Resp 16   Ht 5\' 6"  (1.676 m)   Wt 58.9 kg   SpO2 97%   BMI 20.96 kg/m   Intake/Output Summary (Last 24 hours) at 09/09/2019 1830 Last data filed at 09/09/2019 1702 Gross per 24 hour  Intake 1399.83 ml  Output 825 ml  Net 574.83 ml   Filed Weights   09/03/19 0528  Weight: 58.9 kg    Exam: Patient is examined daily including today on 09/09/2019, exams remain the same as of yesterday except that has changed    General: Baseline dementia ,NAD  Cardiovascular: RRR  Respiratory: Diminished at bases  Abdomen: Soft/ND/NT, positive BS  Musculoskeletal: No Edema  Neuro: alert, not oriented   Data  Reviewed: Basic Metabolic Panel: Recent Labs  Lab 09/04/19 0300 09/05/19 0322 09/06/19 0506 09/07/19 0307 09/08/19 0358 09/09/19 0640  NA 139 139 137 139 142 141  K 3.3* 3.9 3.2* 3.7 3.5 3.0*  CL 105 104 99 103 105 106  CO2 23 26 29 27 25 28   GLUCOSE  114* 109* 92 102* 86 93  BUN 14 13 22 19 17 17   CREATININE 0.92 0.85 1.14 0.84 0.83 0.97  CALCIUM 8.0* 8.5* 8.3* 8.3* 8.4* 8.0*  MG 2.0 2.1 2.3 2.2 2.2  --   PHOS 2.7 2.2* 3.8 2.4* 3.0  --    Liver Function Tests: Recent Labs  Lab 09/04/19 0300 09/05/19 0322 09/06/19 0506 09/07/19 0307 09/08/19 0358  AST 29 26 28  49* 66*  ALT 23 22 20  33 39  ALKPHOS 89 99 89 100 92  BILITOT 0.9 0.6 0.9 1.0 0.6  PROT 6.5 6.7 6.5 7.0 6.3*  ALBUMIN 3.0* 2.9* 2.8* 3.0* 2.6*   No results for input(s): LIPASE, AMYLASE in the last 168 hours. No results for input(s): AMMONIA in the last 168 hours. CBC: Recent Labs  Lab 09/04/19 0300 09/05/19 0322 09/06/19 0506 09/07/19 0307 09/08/19 0358 09/09/19 0640  WBC 17.9* 20.6* 20.1* 19.1* 16.4* 14.9*  NEUTROABS 13.5* 12.8* 11.9* 12.4* 8.8*  --   HGB 12.9* 13.9 12.9* 14.0 13.3 12.0*  HCT 40.4 44.5 40.8 44.0 41.8 38.0*  MCV 95.1 95.9 96.7 96.9 96.5 97.4  PLT 286 321 303 332 325 293   Cardiac Enzymes:   No results for input(s): CKTOTAL, CKMB, CKMBINDEX, TROPONINI in the last 168 hours. BNP (last 3 results) No results for input(s): BNP in the last 8760 hours.  ProBNP (last 3 results) No results for input(s): PROBNP in the last 8760 hours.  CBG: No results for input(s): GLUCAP in the last 168 hours.  Recent Results (from the past 240 hour(s))  Blood culture (routine x 2)     Status: None   Collection Time: 09/02/19  9:13 PM   Specimen: BLOOD  Result Value Ref Range Status   Specimen Description   Final    BLOOD LEFT ANTECUBITAL Performed at [facility], [address] [place], [place] [address]    Special Requests   Final    BOTTLES DRAWN AEROBIC AND ANAEROBIC Blood Culture results may not be optimal due to an inadequate volume of blood received in culture bottles Performed at [facility], [address] [place], [place] [address]    Culture   Final    NO GROWTH 5 DAYS Performed at [facility], [address] [place], [place] [address]    Report Status 09/07/2019 FINAL  Final  Blood culture (routine x 2)     Status: None   Collection Time: 09/02/19  9:13 PM   Specimen: BLOOD  Result Value Ref Range Status   Specimen Description   Final    BLOOD RIGHT ANTECUBITAL Performed at [facility], [address] [place], [place] [address]    Special Requests   Final    BOTTLES DRAWN AEROBIC AND ANAEROBIC Blood Culture adequate volume Performed at [facility], [address] [place], [place] [address]    Culture   Final    NO GROWTH 5 DAYS Performed at [facility], [address] [place], [place] [address]    Report Status 09/07/2019 FINAL  Final  SARS CORONAVIRUS 2 (TAT 6-24 HRS) Nasopharyngeal Nasopharyngeal Swab  Status: None   Collection Time: 09/02/19  9:13 PM   Specimen: Nasopharyngeal Swab  Result Value Ref Range Status   SARS Coronavirus 2 NEGATIVE NEGATIVE Final    Comment: (NOTE) SARS-CoV-2 target nucleic acids are NOT DETECTED. The SARS-CoV-2 RNA is generally detectable in upper and lower respiratory specimens during the acute phase of infection. Negative results do not preclude SARS-CoV-2 infection, do not rule out co-infections with other pathogens, and should not be used as the sole basis for treatment or other patient management decisions. Negative results must be combined with clinical observations, patient history, and epidemiological information. The expected result is Negative. Fact Sheet for Patients: HairSlick.no Fact Sheet for Healthcare Providers: quierodirigir.com This test is not yet approved or cleared by the [place] FDA and  has been authorized for detection and/or diagnosis of SARS-CoV-2 by FDA under an Emergency Use Authorization (EUA). This EUA will remain  in effect (meaning this test can be used) for the duration of the  COVID-19 declaration under Section 56 4(b)(1) of the Act, 21 U.S.C. section 360bbb-3(b)(1), unless the authorization is terminated or revoked sooner. Performed at [facility], [address] [place], [place] [address]   MRSA PCR Screening     Status: Abnormal   Collection Time: 09/02/19 10:57 PM   Specimen: Nasal Mucosa; Nasopharyngeal  Result Value Ref Range Status   MRSA by PCR POSITIVE (A) NEGATIVE Final    Comment:        The GeneXpert MRSA Assay (FDA approved for NASAL specimens only), is one component of a comprehensive MRSA colonization surveillance program. It is not intended to diagnose MRSA infection nor to guide or monitor treatment for MRSA infections. CRITICAL RESULT CALLED TO, READ BACK BY AND VERIFIED WITH: RN V[place] AT 6045 09/03/19 CRUICKSHANK A Performed at [facility], [address], [place], [place] [address]   Aerobic/Anaerobic Culture (surgical/deep wound)     Status: None   Collection Time: 09/04/19 11:09 AM   Specimen: PATH Other; Tissue  Result Value Ref Range Status   Specimen Description   Final    ABSCESS NECK Performed at [facility], [address] [place], [place] [address]    Special Requests   Final    NONE Performed at [facility], [address], [place], [place] [address]    Gram Stain   Final    RARE WBC PRESENT, PREDOMINANTLY PMN RARE GRAM POSITIVE COCCI    Culture   Final    MODERATE METHICILLIN RESISTANT STAPHYLOCOCCUS AUREUS NO ANAEROBES ISOLATED Performed at [facility], [address], [place], [place] [address]    Report Status 09/09/2019 FINAL  Final   Organism ID, Bacteria METHICILLIN RESISTANT STAPHYLOCOCCUS AUREUS  Final      Susceptibility   Methicillin resistant staphylococcus aureus - MIC*    CIPROFLOXACIN >=8 RESISTANT Resistant     ERYTHROMYCIN >=8 RESISTANT Resistant     GENTAMICIN <=0.5 SENSITIVE Sensitive     OXACILLIN >=4 RESISTANT Resistant      TETRACYCLINE <=1 SENSITIVE Sensitive     VANCOMYCIN <=0.5 SENSITIVE Sensitive     TRIMETH/SULFA <=10 SENSITIVE Sensitive     CLINDAMYCIN <=0.25 SENSITIVE Sensitive     RIFAMPIN <=0.5 SENSITIVE Sensitive     Inducible Clindamycin NEGATIVE Sensitive     * MODERATE METHICILLIN RESISTANT STAPHYLOCOCCUS AUREUS     Studies: No results found.  Scheduled Meds: . aspirin EC  81 mg Oral Daily  . cholecalciferol  2,000 Units Oral Daily  . enoxaparin (LOVENOX) injection  40 mg Subcutaneous QHS  . iron polysaccharides  150 mg Oral Daily  . lidocaine  1 patch Transdermal Q24H  . potassium chloride  40 mEq Oral Q4H  . QUEtiapine  25 mg Oral QHS  . vitamin B-12  1,000 mcg Oral Daily    Continuous Infusions: . cefTRIAXone (ROCEPHIN)  IV 2 g (09/09/19 1001)  . metronidazole 500 mg (09/09/19 1135)  . vancomycin 1,500 mg (09/09/19 1133)     Time spent: 45mins I have personally reviewed and interpreted on  09/09/2019 daily labs,  imagings as discussed above under date review session and assessment and plans.  I reviewed all nursing notes, pharmacy notes, consultant notes,  vitals, pertinent old records    Florencia Reasons MD, PhD, [place] [facility]  If 7PM-7AM, please contact night-coverage at [email or web address], password [facility] 09/09/2019, 6:30 PM  LOS: 7 days

## 2019-09-09 NOTE — Progress Notes (Signed)
  Speech Language Pathology Treatment: Dysphagia  Patient Details Name: Kristopher Evans MRN: [id number] DOB: 1935/09/11 Today's Date: 09/09/2019 Time: 7782-4235 SLP Time Calculation (min) (ACUTE ONLY): 17 min  Assessment / Plan / Recommendation Clinical Impression  Pt seen at bedside for follow up after MBS completed 09/08/2019. Pt had lunch tray present. Dys 2 /thin liquid was on the ticket, however, pt's chicken and broccoli were in large pieces with tough parts. RN was informed, and encouraged to check on appropriateness of foods received for pt safety. Will re-enter dys 2 diet and reiterate finely chopped solids.   Pt requires 1:1 assistance with feeding, and ongoing cues to adhere to specific swallow precautions (hard swallow, "hock" to clear pharyngeal residue). No overt s/s aspiration observed during po trials. Safe swallow precautions posted at [facility]. SLP will follow to assess diet tolerance and provide education.    HPI HPI: 84 yo male adm to [facility] with left neck abscess.  He has h/o dementia, and is s/p I &D in OR with ENT on 09/04/2019.  Per RN, pt tolerated po well until the weekend when he likely aspirated pot roast.  Last night SLP attempted visit but pt was lethargic.  11/24 he was fully alert.  Pt CXR showed bilateral LL consolidation - debris in mainstem bronchus and trachea secondary to aspiration.  Per imaging report - on 11/18, pt's pharynx and larynx were not swollen and airway was patent.      SLP Plan  Continue with current plan of care       Recommendations  Diet recommendations: Dysphagia 2 (fine chop);Thin liquid Liquids provided via: Cup;No straw Medication Administration: Whole meds with puree Supervision: Staff to assist with self feeding;Full supervision/cueing for compensatory strategies Compensations: Slow rate;Small sips/bites;Follow solids with liquid(effortful swallow) Postural Changes and/or Swallow Maneuvers: Seated upright 90 degrees;Upright 30-60 min after  meal                Oral Care Recommendations: Oral care QID Follow up Recommendations: 24 hour supervision/assistance SLP Visit Diagnosis: Dysphagia, oropharyngeal phase (R13.12) Plan: Continue with current plan of care       [place], [facility], [place] Pathologist Office: [phone] Pager: [phone]   Shonna Chock 09/09/2019, 2:04 PM

## 2019-09-10 LAB — CBC
HCT: 38.8 % — ABNORMAL LOW (ref 39.0–52.0)
Hemoglobin: 12.3 g/dL — ABNORMAL LOW (ref 13.0–17.0)
MCH: 30.5 pg (ref 26.0–34.0)
MCHC: 31.7 g/dL (ref 30.0–36.0)
MCV: 96.3 fL (ref 80.0–100.0)
Platelets: 319 10*3/uL (ref 150–400)
RBC: 4.03 MIL/uL — ABNORMAL LOW (ref 4.22–5.81)
RDW: 14.1 % (ref 11.5–15.5)
WBC: 21.1 10*3/uL — ABNORMAL HIGH (ref 4.0–10.5)
nRBC: 0 % (ref 0.0–0.2)

## 2019-09-10 LAB — COMPREHENSIVE METABOLIC PANEL
ALT: 38 U/L (ref 0–44)
AST: 71 U/L — ABNORMAL HIGH (ref 15–41)
Albumin: 2.7 g/dL — ABNORMAL LOW (ref 3.5–5.0)
Alkaline Phosphatase: 76 U/L (ref 38–126)
Anion gap: 7 (ref 5–15)
BUN: 13 mg/dL (ref 8–23)
CO2: 26 mmol/L (ref 22–32)
Calcium: 7.8 mg/dL — ABNORMAL LOW (ref 8.9–10.3)
Chloride: 104 mmol/L (ref 98–111)
Creatinine, Ser: 0.89 mg/dL (ref 0.61–1.24)
GFR calc Af Amer: >60 mL/min (ref >60)
GFR calc non Af Amer: >60 mL/min (ref >60)
Glucose, Bld: 86 mg/dL (ref 70–99)
Potassium: 3.4 mmol/L — ABNORMAL LOW (ref 3.5–5.1)
Sodium: 137 mmol/L (ref 135–145)
Total Bilirubin: 0.7 mg/dL (ref 0.3–1.2)
Total Protein: 6.1 g/dL — ABNORMAL LOW (ref 6.5–8.1)

## 2019-09-10 LAB — PHOSPHORUS: Phosphorus: 2.5 mg/dL (ref 2.5–4.6)

## 2019-09-10 LAB — MAGNESIUM: Magnesium: 2 mg/dL (ref 1.7–2.4)

## 2019-09-10 MED ORDER — DIPHENHYDRAMINE HCL 50 MG/ML IJ SOLN
12.5000 mg | Freq: Four times a day (QID) | INTRAMUSCULAR | Status: AC | PRN
  Administered 2019-09-10 – 2019-09-11 (×3): 12.5 mg via INTRAVENOUS
  Filled 2019-09-10 (×3): qty 1

## 2019-09-10 MED ORDER — OLANZAPINE 5 MG PO TABS
2.5000 mg | ORAL_TABLET | Freq: Every day | ORAL | Status: DC
  Administered 2019-09-10 – 2019-09-14 (×5): 2.5 mg via ORAL
  Filled 2019-09-10 (×5): qty 1

## 2019-09-10 MED ORDER — POTASSIUM CHLORIDE 20 MEQ/15ML (10%) PO SOLN
40.0000 meq | Freq: Once | ORAL | Status: AC
  Administered 2019-09-10: 13:00:00 40 meq via ORAL
  Filled 2019-09-10: qty 30

## 2019-09-10 MED ORDER — POLYSACCHARIDE IRON COMPLEX 150 MG PO CAPS
150.0000 mg | ORAL_CAPSULE | Freq: Every day | ORAL | Status: DC
  Administered 2019-09-12 – 2019-09-13 (×2): 150 mg via ORAL
  Filled 2019-09-10 (×2): qty 1

## 2019-09-10 MED ORDER — DOXYCYCLINE HYCLATE 100 MG PO TABS
100.0000 mg | ORAL_TABLET | Freq: Two times a day (BID) | ORAL | Status: AC
  Administered 2019-09-10 – 2019-09-13 (×7): 100 mg via ORAL
  Filled 2019-09-10 (×7): qty 1

## 2019-09-10 NOTE — Progress Notes (Addendum)
PROGRESS NOTE  Kristopher Evans:[id number] DOB: 1935-06-09 DOA: 09/02/2019 PCP: Patient, No Pcp Per  HPI/Recap of past 24 hours: No significant interval changes, neck wound healing well He is alert, with baseline dementia, he is calm currently No fever, vital signs are stable  Assessment/Plan: Principal Problem:   Neck abscess Active Problems:   Dementia (HCC)   HTN (hypertension)   Leucocytosis   Pressure injury of skin   Hypokalemia   Normocytic anemia   Acute respiratory failure with hypoxia (HCC)   Hypophosphatemia   Persistent leukocytosis -does not appear to have worsening of existing infection or developing new infection -will proceed with de-escalating antibiotic regimen -Monitor signs of infection,  repeat CBC and lactic acid  in a.m. -he might have atelectasis, he is not able to use incentive spirometer due to dementia, will start physical therapy, out of bed.  Left neck abscess -He was sent from memory care unit to the ED due to left neck abscess, has significant leukocytosis on presentation -Blood culture no growth -ENT consulted, patient underwent I&D in OR on November 20th, wound culture growing MRSA -He has been on  IV vancomycin fro 6 days post op, neck wound healing well, will transition to doxycycline for another 4 days   Acute hypoxic respiratory failure secondary to aspiration pneumonitis tracheitis -has been on Rocephin and Flagyl for 4 days, Improving, on room air, will de-escalating abx -Diet recommendation per speech as below Diet recommendations: Dysphagia 2 (fine chop);Thin liquid Liquids provided via: Cup;No straw Medication Administration: Whole meds with puree Supervision: Staff to assist with self feeding;Full supervision/cueing for compensatory strategies Compensations: Slow rate;Small sips/bites;Follow solids with liquid(effortful swallow) Postural Changes and/or Swallow Maneuvers: Seated upright 90 degrees;Upright 30-60 min after  meal  electrolyte abnormalities including hypokalemia, hyponatremia, hypophosphatemia Sodium and Phos  has normalized K remain low, continue replace, recheck in a.m.  Left subclavian stenosis: CT neck which was obtained to rule out Lemierre syndrome did not show any jugular vein thrombosis but revealed critical left subclavian stenosis 75 to 90% per radiology report.    By hospitalist colleague Dr. Lajuana Ripple called and discussed with vascular surgery Dr. Edilia Bo who recommended conservative management with aspirin.  Per vascular surgery, can consider obtaining carotid duplex to assess for subclavian steal syndrome/reversal of carotid blood flow if patient complains of dizziness (unlikely to ever know in this patient with advanced dementia).  Avoid blood pressure checks in left arm as can be falsely low.  Lipid profile hdl 31, ldl 73  Delirium with history of dementia -He did not respond to Haldol or trazodone per previous report -he is currently on low-dose Seroquel at nighttime,  QTC is prolonged, will d/c seroquel, will try zyprexa, safety sitter as needed -We will avoid benzo -Seems to be calm today  Stage I pressure ulcer right buttock presented on admission  Failure to thrive,  He is from memory care unit,  physical therapy recommend snf or return to memory care if they can arrange PT  DVT Prophylaxis: subQ lovenox  Code Status: DNR  Family Communication: patient   Disposition Plan: return to memory care unit with home health vs SNF Will need to repeat cbc  Consultants:  ENT  Vascular surgery  Procedures:  Left neck abscess I&D in OR by ENT  Antibiotics:  As above   Objective: BP (!) 168/89 (BP Location: Right Arm)    Pulse 72    Temp 98.7 F (37.1 C) (Oral)    Resp 18  Ht 5\' 6"  (1.676 m)    Wt 58.9 kg    SpO2 97%    BMI 20.96 kg/m   Intake/Output Summary (Last 24 hours) at 09/10/2019 1235 Last data filed at 09/10/2019 1015 Gross per 24 hour  Intake 1136.22 ml   Output 1600 ml  Net -463.78 ml   Filed Weights   09/03/19 0528  Weight: 58.9 kg    Exam: Patient is examined daily including today on 09/10/2019, exams remain the same as of yesterday except that has changed    General: Baseline dementia ,NAD  Cardiovascular: RRR  Respiratory: Diminished at bases  Abdomen: Soft/ND/NT, positive BS  Musculoskeletal: No Edema  Neuro: alert, not oriented   Data Reviewed: Basic Metabolic Panel: Recent Labs  Lab 09/05/19 0322 09/06/19 0506 09/07/19 0307 09/08/19 0358 09/09/19 0640 09/10/19 0257  NA 139 137 139 142 141 137  K 3.9 3.2* 3.7 3.5 3.0* 3.4*  CL 104 99 103 105 106 104  CO2 26 29 27 25 28 26   GLUCOSE 109* 92 102* 86 93 86  BUN 13 22 19 17 17 13   CREATININE 0.85 1.14 0.84 0.83 0.97 0.89  CALCIUM 8.5* 8.3* 8.3* 8.4* 8.0* 7.8*  MG 2.1 2.3 2.2 2.2  --  2.0  PHOS 2.2* 3.8 2.4* 3.0  --  2.5   Liver Function Tests: Recent Labs  Lab 09/05/19 0322 09/06/19 0506 09/07/19 0307 09/08/19 0358 09/10/19 0257  AST 26 28 49* 66* 71*  ALT 22 20 33 39 38  ALKPHOS 99 89 100 92 76  BILITOT 0.6 0.9 1.0 0.6 0.7  PROT 6.7 6.5 7.0 6.3* 6.1*  ALBUMIN 2.9* 2.8* 3.0* 2.6* 2.7*   No results for input(s): LIPASE, AMYLASE in the last 168 hours. No results for input(s): AMMONIA in the last 168 hours. CBC: Recent Labs  Lab 09/04/19 0300 09/05/19 0322 09/06/19 0506 09/07/19 0307 09/08/19 0358 09/09/19 0640 09/10/19 0257  WBC 17.9* 20.6* 20.1* 19.1* 16.4* 14.9* 21.1*  NEUTROABS 13.5* 12.8* 11.9* 12.4* 8.8*  --   --   HGB 12.9* 13.9 12.9* 14.0 13.3 12.0* 12.3*  HCT 40.4 44.5 40.8 44.0 41.8 38.0* 38.8*  MCV 95.1 95.9 96.7 96.9 96.5 97.4 96.3  PLT 286 321 303 332 325 293 319   Cardiac Enzymes:   No results for input(s): CKTOTAL, CKMB, CKMBINDEX, TROPONINI in the last 168 hours. BNP (last 3 results) No results for input(s): BNP in the last 8760 hours.  ProBNP (last 3 results) No results for input(s): PROBNP in the last 8760  hours.  CBG: No results for input(s): GLUCAP in the last 168 hours.  Recent Results (from the past 240 hour(s))  Blood culture (routine x 2)     Status: None   Collection Time: 09/02/19  9:13 PM   Specimen: BLOOD  Result Value Ref Range Status   Specimen Description   Final    BLOOD LEFT ANTECUBITAL Performed at [facility] [address], [place], [place] [address]    Special Requests   Final    BOTTLES DRAWN AEROBIC AND ANAEROBIC Blood Culture results may not be optimal due to an inadequate volume of blood received in culture bottles Performed at [facility] [address], [place], [place] [address]    Culture   Final    NO GROWTH 5 DAYS Performed at [facility], [place] [address] [place], [place] [address]    Report Status 09/07/2019 FINAL  Final  Blood culture (routine x  2)     Status: None   Collection Time: 09/02/19  9:13 PM   Specimen: BLOOD  Result Value Ref Range Status   Specimen Description   Final    BLOOD RIGHT ANTECUBITAL Performed at [facility], [address], [place], [place] [address]    Special Requests   Final    BOTTLES DRAWN AEROBIC AND ANAEROBIC Blood Culture adequate volume Performed at [facility], [address], [place], [place] [address]    Culture   Final    NO GROWTH 5 DAYS Performed at [facility], [address] [place], [place] [address]    Report Status 09/07/2019 FINAL  Final  SARS CORONAVIRUS 2 (TAT 6-24 HRS) Nasopharyngeal Nasopharyngeal Swab     Status: None   Collection Time: 09/02/19  9:13 PM   Specimen: Nasopharyngeal Swab  Result Value Ref Range Status   SARS Coronavirus 2 NEGATIVE NEGATIVE Final    Comment: (NOTE) SARS-CoV-2 target nucleic acids are NOT DETECTED. The SARS-CoV-2 RNA is generally detectable in upper and lower respiratory specimens during the acute phase of infection. Negative results do not preclude SARS-CoV-2  infection, do not rule out co-infections with other pathogens, and should not be used as the sole basis for treatment or other patient management decisions. Negative results must be combined with clinical observations, patient history, and epidemiological information. The expected result is Negative. Fact Sheet for Patients: HairSlick.no Fact Sheet for Healthcare Providers: quierodirigir.com This test is not yet approved or cleared by the [place] FDA and  has been authorized for detection and/or diagnosis of SARS-CoV-2 by FDA under an Emergency Use Authorization (EUA). This EUA will remain  in effect (meaning this test can be used) for the duration of the COVID-19 declaration under Section 56 4(b)(1) of the Act, 21 U.S.C. section 360bbb-3(b)(1), unless the authorization is terminated or revoked sooner. Performed at [facility], [address] [place], [place] [address]   MRSA PCR Screening     Status: Abnormal   Collection Time: 09/02/19 10:57 PM   Specimen: Nasal Mucosa; Nasopharyngeal  Result Value Ref Range Status   MRSA by PCR POSITIVE (A) NEGATIVE Final    Comment:        The GeneXpert MRSA Assay (FDA approved for NASAL specimens only), is one component of a comprehensive MRSA colonization surveillance program. It is not intended to diagnose MRSA infection nor to guide or monitor treatment for MRSA infections. CRITICAL RESULT CALLED TO, READ BACK BY AND VERIFIED WITH: RN VIR[place] AT 6767 09/03/19 CRUICKSHANK A Performed at [facility], [address], [place], [place] [address]   Aerobic/Anaerobic Culture (surgical/deep wound)     Status: None   Collection Time: 09/04/19 11:09 AM   Specimen: PATH Other; Tissue  Result Value Ref Range Status   Specimen Description   Final    ABSCESS NECK Performed at [facility], [address], [place], [place] [address]    Special  Requests   Final    NONE Performed at [facility], [address] [place], [place] [address]    Gram Stain   Final    RARE WBC PRESENT, PREDOMINANTLY PMN RARE GRAM POSITIVE COCCI    Culture   Final    MODERATE METHICILLIN RESISTANT STAPHYLOCOCCUS AUREUS NO ANAEROBES ISOLATED Performed at [facility], [address] [place], [place] [address]    Report Status 09/09/2019 FINAL  Final   Organism  ID, Bacteria METHICILLIN RESISTANT STAPHYLOCOCCUS AUREUS  Final      Susceptibility   Methicillin resistant staphylococcus aureus - MIC*    CIPROFLOXACIN >=8 RESISTANT Resistant     ERYTHROMYCIN >=8 RESISTANT Resistant     GENTAMICIN <=0.5 SENSITIVE Sensitive     OXACILLIN >=4 RESISTANT Resistant     TETRACYCLINE <=1 SENSITIVE Sensitive     VANCOMYCIN <=0.5 SENSITIVE Sensitive     TRIMETH/SULFA <=10 SENSITIVE Sensitive     CLINDAMYCIN <=0.25 SENSITIVE Sensitive     RIFAMPIN <=0.5 SENSITIVE Sensitive     Inducible Clindamycin NEGATIVE Sensitive     * MODERATE METHICILLIN RESISTANT STAPHYLOCOCCUS AUREUS     Studies: No results found.  Scheduled Meds:  aspirin EC  81 mg Oral Daily   cholecalciferol  2,000 Units Oral Daily   enoxaparin (LOVENOX) injection  40 mg Subcutaneous QHS   iron polysaccharides  150 mg Oral Daily   lidocaine  1 patch Transdermal Q24H   potassium chloride  40 mEq Oral Once   QUEtiapine  25 mg Oral QHS   vitamin B-12  1,000 mcg Oral Daily    Continuous Infusions:    Time spent: 35mins I have personally reviewed and interpreted on  09/10/2019 daily labs,  imagings as discussed above under date review session and assessment and plans.  I reviewed all nursing notes, pharmacy notes, consultant notes,  vitals, pertinent old records    Albertine GratesFang Gaynell Eggleton MD, PhD, FACP  [facility]  If 7PM-7AM, please contact night-coverage at [email or web address], password [email or web address] 09/10/2019, 12:35 PM  LOS: 8 days

## 2019-09-10 NOTE — Evaluation (Signed)
Physical Therapy Evaluation Patient Details Name: Kristopher Evans MRN: [id number] DOB: Apr 19, 1935 Today's Date: 09/10/2019   History of Present Illness  Patient is 84 y.o male s/p I&D of neck abscess on 09/04/19 with PMH significant for dimentia.  Clinical Impression  Kristopher Evans is 84 y.o. male admitted with above HPI and diagnosis. Patient is currently limited by functional impairments below (see PT problem list). Patient admitted from Memory Care SNF, [facility], and has been ambulatory at baseline , however it is unknown if pt uses device or has assistance to mobilize. Pt was able to ambulate with RW and min assist to prevent LOB. Patient will benefit from continued skilled PT interventions to address impairments and progress independence with mobility, recommending return to [facility] with PT follow up and assistance 24/7; if unable to provide recommend SNF. Acute PT will follow and progress as able.     Follow Up Recommendations SNF(Return to [facility] with HHPT or to another SNF if they cannot provide)    Equipment Recommendations  None recommended by PT    Recommendations for Other Services       Precautions / Restrictions Precautions Precautions: Fall Restrictions Weight Bearing Restrictions: No      Mobility  Bed Mobility Overal bed mobility: Needs Assistance Bed Mobility: Supine to Sit;Sit to Supine     Supine to sit: Supervision;HOB elevated Sit to supine: HOB elevated;Supervision   General bed mobility comments: cues for safety as pt is somewhat eager to move at start of session, no physical assist required  Transfers Overall transfer level: Needs assistance Equipment used: Rolling walker (2 wheeled) Transfers: Sit to/from Stand Sit to Stand: Min assist         General transfer comment: verbal/tactile cues required for safe technique with RW and assist requried to complete power up and steady with rising. pt with posterior lean initially  upon standing.  Ambulation/Gait Ambulation/Gait assistance: Min assist Gait Distance (Feet): 80 Feet Assistive device: Rolling walker (2 wheeled) Gait Pattern/deviations: Narrow base of support;Step-to pattern;Decreased stride length;Shuffle;Drifts right/left;Trunk flexed Gait velocity: slow and unsteady   General Gait Details: pt requried verbal/tactile cues for safe hand placement on RW and to maintain safe proximity within RW. He has NBOS throughout and 3 episodes of LOB requirign min assist from therapist to correct. Pt requries assist to manage RW during turns and requried simple one step commands/directions for where to walk.  Stairs            Wheelchair Mobility    Modified Rankin (Stroke Patients Only)       Balance Overall balance assessment: Needs assistance Sitting-balance support: Feet supported;No upper extremity supported Sitting balance-Leahy Scale: Fair     Standing balance support: During functional activity;Bilateral upper extremity supported Standing balance-Leahy Scale: Poor            Pertinent Vitals/Pain Pain Assessment: No/denies pain    Home Living Family/patient expects to be discharged to:: Skilled nursing facility         Prior Function           Comments: per chart reviewe patient was ambulatory at SNF, [facility], where he is in memory care. Pt is not a reliable historian and was unable to provide PLOF. He denies using AD to mobilize but is [place] with transfers and gait.     Hand Dominance        Extremity/Trunk Assessment   Upper Extremity Assessment Upper Extremity Assessment: Generalized weakness    Lower  Extremity Assessment Lower Extremity Assessment: Generalized weakness       Communication      Cognition Arousal/Alertness: Awake/alert Behavior During Therapy: WFL for tasks assessed/performed Overall Cognitive Status: History of cognitive impairments - at baseline         General Comments: Pt is  oriented to self but not time, place, or situation. He is pleasantly confused throughout session and agreeable to participate in therapy. Pt has difficulty answering questions and is tangential in conversation.             Assessment/Plan    PT Assessment Patient needs continued PT services  PT Problem List Decreased strength;Decreased mobility;Decreased balance;Decreased knowledge of use of DME;Decreased activity tolerance;Decreased safety awareness;Decreased cognition       PT Treatment Interventions DME instruction;Gait training;Therapeutic activities;Functional mobility training;Balance training;Patient/family education;Therapeutic exercise    PT Goals (Current goals can be found in the Care Plan section)  Acute Rehab PT Goals Patient Stated Goal: none stated PT Goal Formulation: Patient unable to participate in goal setting Time For Goal Achievement: 09/24/19 Potential to Achieve Goals: Good    Frequency Min 2X/week    AM-PAC PT "6 Clicks" Mobility  Outcome Measure Help needed turning from your back to your side while in a flat bed without using bedrails?: A Little Help needed moving from lying on your back to sitting on the side of a flat bed without using bedrails?: A Little Help needed moving to and from a bed to a chair (including a wheelchair)?: A Little Help needed standing up from a chair using your arms (e.g., wheelchair or bedside chair)?: A Little Help needed to walk in hospital room?: A Little Help needed climbing 3-5 steps with a railing? : A Lot 6 Click Score: 17    End of Session Equipment Utilized During Treatment: Gait belt Activity Tolerance: Patient tolerated treatment well Patient left: in bed;with call bell/phone within reach;with bed alarm set Nurse Communication: Mobility status PT Visit Diagnosis: Muscle weakness (generalized) (M62.81);Unsteadiness on feet (R26.81);Difficulty in walking, not elsewhere classified (R26.2)    Time: 0822-0850 PT  Time Calculation (min) (ACUTE ONLY): 28 min   Charges:   PT Evaluation $PT Eval Low Complexity: 1 Low PT Treatments $Gait Training: 8-22 mins        Kipp Brood, PT, DPT Physical Therapist with [facility]  09/10/2019 11:45 AM

## 2019-09-10 NOTE — Progress Notes (Signed)
ENT Progress Note: POD #6 s/p Procedure(s): IRRIGATION AND DEBRIDEMENT OF NECK ABCESS   Subjective: No pain  Objective: Vital signs in last 24 hours: Temp:  [98 F (36.7 C)-98.7 F (37.1 C)] 98.7 F (37.1 C) (11/26 0516) Pulse Rate:  [66-72] 72 (11/25 2116) Resp:  [16-18] 18 (11/26 0516) BP: (140-168)/(75-89) 168/89 (11/26 0516) SpO2:  [97 %] 97 % (11/25 2116) Weight change:  Last BM Date: 09/08/19  Intake/Output from previous day: 11/25 0701 - 11/26 0700 In: 1379.8 [P.O.:480; IV Piggyback:899.8] Out: 1725 [Urine:1725] Intake/Output this shift: No intake/output data recorded.  Labs: Recent Labs    09/09/19 0640 09/10/19 0257  WBC 14.9* 21.1*  HGB 12.0* 12.3*  HCT 38.0* 38.8*  PLT 293 319   Recent Labs    09/09/19 0640 09/10/19 0257  NA 141 137  K 3.0* 3.4*  CL 106 104  CO2 28 26  GLUCOSE 93 86  BUN 17 13  CALCIUM 8.0* 7.8*    Studies/Results: Dg Swallowing Func-speech Pathology  Result Date: 09/08/2019 Objective Swallowing Evaluation: Type of Study: MBS-Modified Barium Swallow Study  Patient Details Name: STEPFON RAWLES MRN: [id number] Date of Birth: [birth year] Today's Date: 09/08/2019 Time: SLP Start Time (ACUTE ONLY): 1215 -SLP Stop Time (ACUTE ONLY): 1245 SLP Time Calculation (min) (ACUTE ONLY): 30 min Past Medical History: Past Medical History: Diagnosis Date . Dementia [facility])  Past Surgical History: Past Surgical History: Procedure Laterality Date . MINOR IRRIGATION AND DEBRIDEMENT OF WOUND Left 09/04/2019  Procedure: IRRIGATION AND DEBRIDEMENT OF NECK ABCESS;  Surgeon: Osborn Coho, MD;  Location: [facility];  Service: ENT;  Laterality: Left; HPI: 84 yo male adm to [facility] with left neck abscess.  He has h/o dementia, and is s/p I &D in OR with ENT on 09/04/2019.  Per RN, pt tolerated po well until the weekend when he likely aspirated pot roast.  Last night SLP attempted visit but pt was lehtargic.  11/24 he will fully alert.  Pt CXR showed bilateral LL  consolidation - debris in mainstem bronchus and trachea secondary to aspiration.  Per imaging report - on 11/18, pt's pharynx and larynx were not swollen and airway was parent.  Subjective: pt awake in chair Patient presents with mild-moderate oropharyngeal dysphagia with decreased mastication ability of hard boluses *eg poor mastication of barium tablet when he attempted* suspect illl fitting dentures impacting function.  He demonstrated functional oral transiting with liquids, soft solids and purees.  Pharyngeal swallow c/b sensorimotor deficits with decreased tongue base retraction,pharyngeal constriction resulting in vallecular residuals across all boluses without awareness.  Postural changes - head turn right/left, chin tuck were not helpful to prevent.  Cued STRONG swallow was helpful with solids as well as drinking liquids after swallow.  Pt did cough and expectorate bolus residuals from vallecular region with max cues and advise he continue this strategy.  Two episodes of mild aspiration with thin liquids due to decreased timing of laryngeal closure with delayed cough response - bolus was too far into trachea to clear.  Pt will benefit from full supervision as he takes very LARGE boluses despite verbal/visual cues to small boluses.   Upon esophgeal sweep, pt appears with residuals without awareness.  Recommend very strict aspiration/esophageal precautions.   Dys2/thin diet recommended at this time to mitigate aspiration risk.  SLP will follow up for education, compensation strategies and strengthening.  Thanks for this consult.   Of note, pt did NOT cough during MBS when he did not aspirate - cough x2  only with thin aspiration.  Swallow Evaluation Recommendations   Recommended Consults: Consider esophageal assessment   SLP Diet Recommendations: Dysphagia 2 (Fine chop) solids;Thin liquid   Liquid Administration via: Cup;No straw(NO STRAW)      Supervision: Full assist for feeding;Full  supervision/cueing for compensatory strategies   Compensations: Slow rate;Small sips/bites;Follow solids with liquid(EFFORTFUL SWALLOW)  Cough/"Hock" to clear pharyngeal retention.   Postural Changes: Remain semi-upright after after feeds/meals (Comment);Seated upright at 90 degrees   Oral Care Recommendations: Oral care QID Assessment / Plan / Recommendation CHL IP CLINICAL IMPRESSIONS 09/08/2019 Clinical Impression  SLP Visit Diagnosis Dysphagia, oropharyngeal phase (R13.12) Attention and concentration deficit following -- Frontal lobe and executive function deficit following -- Impact on safety and function Moderate aspiration risk   CHL IP TREATMENT RECOMMENDATION 09/08/2019 Treatment Recommendations Therapy as outlined in treatment plan below   Prognosis 09/08/2019 Prognosis for Safe Diet Advancement Fair Barriers to Reach Goals Cognitive deficits Barriers/Prognosis Comment -- CHL IP DIET RECOMMENDATION 09/08/2019 SLP Diet Recommendations Dysphagia 2 (Fine chop) solids;Thin liquid Liquid Administration via Cup;No straw Medication Administration -- Compensations Slow rate;Small sips/bites;Follow solids with liquid Postural Changes Remain semi-upright after after feeds/meals (Comment);Seated upright at 90 degrees   CHL IP OTHER RECOMMENDATIONS 09/08/2019 Recommended Consults Consider esophageal assessment Oral Care Recommendations Oral care QID Other Recommendations --   No flowsheet data found.  CHL IP FREQUENCY AND DURATION 09/08/2019 Speech Therapy Frequency (ACUTE ONLY) min 2x/week Treatment Duration 2 weeks      CHL IP ORAL PHASE 09/08/2019 Oral Phase Impaired Oral - Pudding Teaspoon -- Oral - Pudding Cup -- Oral - Honey Teaspoon -- Oral - Honey Cup -- Oral - Nectar Teaspoon WFL Oral - Nectar Cup [facility];Premature spillage Oral - Nectar Straw WFL;Premature spillage Oral - Thin Teaspoon WFL;Premature spillage Oral - Thin Cup [facility];Premature spillage Oral - Thin Straw WFL;Premature spillage Oral - Puree WFL Oral  - Mech Soft WFL Oral - Regular -- Oral - Multi-Consistency -- Oral - Pill Weak lingual manipulation;Other (Comment) Oral Phase - Comment --  CHL IP PHARYNGEAL PHASE 09/08/2019 Pharyngeal Phase Impaired Pharyngeal- Pudding Teaspoon -- Pharyngeal -- Pharyngeal- Pudding Cup -- Pharyngeal -- Pharyngeal- Honey Teaspoon -- Pharyngeal -- Pharyngeal- Honey Cup -- Pharyngeal -- Pharyngeal- Nectar Teaspoon Reduced pharyngeal peristalsis;Reduced tongue base retraction;Pharyngeal residue - valleculae;Reduced epiglottic inversion Pharyngeal Material does not enter airway Pharyngeal- Nectar Cup Reduced pharyngeal peristalsis;Reduced epiglottic inversion;Reduced tongue base retraction;Pharyngeal residue - valleculae Pharyngeal Material does not enter airway Pharyngeal- Nectar Straw Reduced epiglottic inversion;Reduced pharyngeal peristalsis;Pharyngeal residue - valleculae Pharyngeal Material does not enter airway Pharyngeal- Thin Teaspoon Reduced pharyngeal peristalsis;Reduced epiglottic inversion;Reduced tongue base retraction;Pharyngeal residue - valleculae Pharyngeal Material does not enter airway Pharyngeal- Thin Cup Reduced epiglottic inversion;Reduced laryngeal elevation;Reduced airway/laryngeal closure;Reduced tongue base retraction;Reduced pharyngeal peristalsis Pharyngeal Material does not enter airway Pharyngeal- Thin Straw Reduced pharyngeal peristalsis;Reduced tongue base retraction;Reduced airway/laryngeal closure;Reduced laryngeal elevation;Pharyngeal residue - valleculae;Trace aspiration;Penetration/Aspiration during swallow Pharyngeal Material enters airway, passes BELOW cords without attempt by patient to eject out (silent aspiration);Material enters airway, passes BELOW cords and not ejected out despite cough attempt by patient Pharyngeal- Puree Reduced pharyngeal peristalsis;Reduced tongue base retraction;Pharyngeal residue - valleculae Pharyngeal Material does not enter airway Pharyngeal- Mechanical Soft WFL  Pharyngeal Material does not enter airway Pharyngeal- Regular -- Pharyngeal -- Pharyngeal- Multi-consistency -- Pharyngeal Material does not enter airway Pharyngeal- Pill NT Pharyngeal -- Pharyngeal Comment --  CHL IP CERVICAL ESOPHAGEAL PHASE 09/08/2019 Cervical Esophageal Phase Impaired Pudding Teaspoon -- Pudding Cup -- Honey Teaspoon -- Honey Cup --  Nectar Teaspoon -- Nectar Cup -- Nectar Straw -- Thin Teaspoon -- Thin Cup -- Thin Straw -- Puree -- Mechanical Soft -- Regular -- Multi-consistency -- Pill -- Cervical Esophageal Comment Patient appeared with full esophagus of barium without sensation, ? dysmotilty component and ? if possible esophageal deficits may be impacting pharyngeal swallow. Chales AbrahamsKimball, Tamara Ann 09/08/2019, 1:14 PM  Donavan Burnetamara Kimball, MS [facility] SLP Acute Rehab Services Pager [phone] Office [phone]               PHYSICAL EXAM: Near complete healing of left neck wound No erythema. Min d/c    Assessment/Plan: Dramatic response to I&D and Abx tx Cont current therapy and limited wound care D/c per med svc -  No f/u with ent needed    Osborn CohoDavid Bedford Winsor 09/10/2019, 9:23 AM

## 2019-09-11 ENCOUNTER — Inpatient Hospital Stay (HOSPITAL_COMMUNITY): Payer: PPO

## 2019-09-11 LAB — COMPREHENSIVE METABOLIC PANEL
ALT: 59 U/L — ABNORMAL HIGH (ref 0–44)
AST: 123 U/L — ABNORMAL HIGH (ref 15–41)
Albumin: 3 g/dL — ABNORMAL LOW (ref 3.5–5.0)
Alkaline Phosphatase: 93 U/L (ref 38–126)
Anion gap: 8 (ref 5–15)
BUN: 12 mg/dL (ref 8–23)
CO2: 25 mmol/L (ref 22–32)
Calcium: 8.4 mg/dL — ABNORMAL LOW (ref 8.9–10.3)
Chloride: 106 mmol/L (ref 98–111)
Creatinine, Ser: 0.87 mg/dL (ref 0.61–1.24)
GFR calc Af Amer: >60 mL/min (ref >60)
GFR calc non Af Amer: >60 mL/min (ref >60)
Glucose, Bld: 89 mg/dL (ref 70–99)
Potassium: 4.4 mmol/L (ref 3.5–5.1)
Sodium: 139 mmol/L (ref 135–145)
Total Bilirubin: 0.6 mg/dL (ref 0.3–1.2)
Total Protein: 6.9 g/dL (ref 6.5–8.1)

## 2019-09-11 LAB — CBC WITH DIFFERENTIAL/PLATELET
Abs Immature Granulocytes: 0.32 10*3/uL — ABNORMAL HIGH (ref 0.00–0.07)
Basophils Absolute: 0.2 10*3/uL — ABNORMAL HIGH (ref 0.0–0.1)
Basophils Relative: 1 %
Eosinophils Absolute: 0.4 10*3/uL (ref 0.0–0.5)
Eosinophils Relative: 2 %
HCT: 44.5 % (ref 39.0–52.0)
Hemoglobin: 14 g/dL (ref 13.0–17.0)
Immature Granulocytes: 2 %
Lymphocytes Relative: 39 %
Lymphs Abs: 6.9 10*3/uL — ABNORMAL HIGH (ref 0.7–4.0)
MCH: 30.5 pg (ref 26.0–34.0)
MCHC: 31.5 g/dL (ref 30.0–36.0)
MCV: 96.9 fL (ref 80.0–100.0)
Monocytes Absolute: 1.3 10*3/uL — ABNORMAL HIGH (ref 0.1–1.0)
Monocytes Relative: 7 %
Neutro Abs: 8.9 10*3/uL — ABNORMAL HIGH (ref 1.7–7.7)
Neutrophils Relative %: 49 %
Platelets: 392 10*3/uL (ref 150–400)
RBC: 4.59 MIL/uL (ref 4.22–5.81)
RDW: 14.4 % (ref 11.5–15.5)
WBC: 18 10*3/uL — ABNORMAL HIGH (ref 4.0–10.5)
nRBC: 0 % (ref 0.0–0.2)

## 2019-09-11 LAB — LACTIC ACID, PLASMA: Lactic Acid, Venous: 1.4 mmol/L (ref 0.5–1.9)

## 2019-09-11 LAB — PATHOLOGIST SMEAR REVIEW

## 2019-09-11 MED ORDER — FUROSEMIDE 10 MG/ML IJ SOLN
40.0000 mg | Freq: Once | INTRAMUSCULAR | Status: AC
  Administered 2019-09-11: 11:00:00 40 mg via INTRAVENOUS
  Filled 2019-09-11: qty 4

## 2019-09-11 NOTE — Progress Notes (Signed)
Noted swelling to right inguinal area, lower extremities cool to touch, per staff swelling was not present yesterday.  Paged MD XU, made aware, stated she will come assess

## 2019-09-11 NOTE — Progress Notes (Signed)
PROGRESS NOTE  ALDYN TOON XBD:[id number] DOB: 1935-09-10 DOA: 09/02/2019 PCP: Patient, No Pcp Per  HPI/Recap of past 24 hours: Has intermittent agitation, appeared to respond to Benadryl Today he is alert, with baseline dementia, he is calm currently No fever  Assessment/Plan: Principal Problem:   Neck abscess Active Problems:   Dementia (HCC)   HTN (hypertension)   Leucocytosis   Pressure injury of skin   Hypokalemia   Normocytic anemia   Acute respiratory failure with hypoxia (HCC)   Hypophosphatemia     Left neck abscess (presenting symptom) -He was sent from memory care unit to the ED due to left neck abscess, has significant leukocytosis on presentation -Blood culture no growth -ENT consulted, patient underwent I&D in OR on November 20th, wound culture growing MRSA -He has been on  IV vancomycin fro 6 days post op, neck wound healing well, transitioned  to doxycycline on 11/26  for another 4 days   Persistent leukocytosis, neutrophil percentage range from 49% to 65%, lymphocyte percentage ranged from 24% to 39% -neck wound healing well, does not appear to have worsening of existing infection or developing new infection -abx de-escalated on 11/26, wbc slightly trending down today, lactic acid unremarkable -continue Monitor signs of infection --he might have atelectasis, he is not able to use incentive spirometer due to dementia, will start physical therapy, out of bed. -We will consider hematology consult if leukocytosis does not improve  LFT elevation: Ab [place] no acute findings, showed history of cholecystectomy, signs of hepatosteatosis. lfT possibly from liver congestion, will try 1 dose of Lasix, repeat labs in a.m. Monitor   Acute hypoxic respiratory failure secondary to aspiration pneumonitis tracheitis -has been on Rocephin and Flagyl for 4 days, Improving, on room air, will de-escalating abx -Diet recommendation per speech as below Diet recommendations:  Dysphagia 2 (fine chop);Thin liquid Liquids provided via: Cup;No straw Medication Administration: Whole meds with puree Supervision: Staff to assist with self feeding;Full supervision/cueing for compensatory strategies Compensations: Slow rate;Small sips/bites;Follow solids with liquid(effortful swallow) Postural Changes and/or Swallow Maneuvers: Seated upright 90 degrees;Upright 30-60 min after meal  electrolyte abnormalities including hypokalemia, hyponatremia, hypophosphatemia Normalized, continue monitor.  Left subclavian stenosis: CT neck which was obtained to rule out Lemierre syndrome did not show any jugular vein thrombosis but revealed critical left subclavian stenosis 75 to 90% per radiology report.    By hospitalist colleague Dr. Lajuana Ripple called and discussed with vascular surgery Dr. Edilia Bo who recommended conservative management with aspirin.  Per vascular surgery, can consider obtaining carotid duplex to assess for subclavian steal syndrome/reversal of carotid blood flow if patient complains of dizziness (unlikely to ever know in this patient with advanced dementia).  Avoid blood pressure checks in left arm as can be falsely low.  Lipid profile hdl 31, ldl 73  Delirium with history of dementia -He did not respond to Haldol or trazodone per previous report -he is currently on low-dose Seroquel at nighttime,  QTC is prolonged, d/ced seroquel, changed to zyprexa on 11/26, safety sitter as needed -We will avoid benzo -Appears to responded to Benadryl  Stage I pressure ulcer right buttock presented on admission  Failure to thrive,  He is from memory care unit,  physical therapy recommend snf or return to memory care if they can arrange PT  DVT Prophylaxis: subQ lovenox  Code Status: DNR  Family Communication: patient   Disposition Plan: return to memory care unit with home health vs SNF Will need to repeat cbc  Consultants:  ENT  Vascular surgery  Procedures:  Left  neck abscess I&D in OR by ENT  Antibiotics:  As above   Objective: BP (!) 186/77 (BP Location: Right Arm) Comment: RN Notified  Pulse 73   Temp 98.6 F (37 C) (Oral)   Resp 16   Ht  (1.676 m)   Wt 58.9 kg   SpO2 97%   BMI 20.96 kg/m   Intake/Output Summary (Last 24 hours) at 09/11/2019 1623 Last data filed at 09/11/2019 1358 Gross per 24 hour  Intake 380 ml  Output 2975 ml  Net -2595 ml   Filed Weights   09/03/19 0528  Weight: 58.9 kg    Exam: Patient is examined daily including today on 09/11/2019, exams remain the same as of yesterday except that has changed    General: Baseline dementia ,NAD  Cardiovascular: RRR  Respiratory: Diminished at bases  Abdomen: Soft/ND/NT, positive BS  Musculoskeletal: No Edema  Neuro: alert, not oriented   Data Reviewed: Basic Metabolic Panel: Recent Labs  Lab 09/05/19 0322 09/06/19 0506 09/07/19 0307 09/08/19 0358 09/09/19 0640 09/10/19 0257 09/11/19 0307  NA 139 137 139 142 141 137 139  K 3.9 3.2* 3.7 3.5 3.0* 3.4* 4.4  CL 104 99 103 105 106 104 106  CO2 GLUCOSE 109* 92 102* 86 93 86 89  BUN CREATININE 0.85 1.14 0.84 0.83 0.97 0.89 0.87  CALCIUM 8.5* 8.3* 8.3* 8.4* 8.0* 7.8* 8.4*  MG 2.1 2.3 2.2 2.2  --  2.0  --   PHOS 2.2* 3.8 2.4* 3.0  --  2.5  --    Liver Function Tests: Recent Labs  Lab 09/06/19 0506 09/07/19 0307 09/08/19 0358 09/10/19 0257 09/11/19 0307  AST 28 49* 66* 71* 123*  ALT 20 33 39 38 59*  ALKPHOS 89 100 92 76 93  BILITOT 0.9 1.0 0.6 0.7 0.6  PROT 6.5 7.0 6.3* 6.1* 6.9  ALBUMIN 2.8* 3.0* 2.6* 2.7* 3.0*   No results for input(s): LIPASE, AMYLASE in the last 168 hours. No results for input(s): AMMONIA in the last 168 hours. CBC: Recent Labs  Lab 09/05/19 0322 09/06/19 0506 09/07/19 0307 09/08/19 0358 09/09/19 0640 09/10/19 0257 09/11/19 0307  WBC 20.6* 20.1* 19.1* 16.4* 14.9* 21.1* 18.0*  NEUTROABS 12.8* 11.9* 12.4* 8.8*  --    --  8.9*  HGB 13.9 12.9* 14.0 13.3 12.0* 12.3* 14.0  HCT 44.5 40.8 44.0 41.8 38.0* 38.8* 44.5  MCV 95.9 96.7 96.9 96.5 97.4 96.3 96.9  PLT 321 303 332 325 293 319 392   Cardiac Enzymes:   No results for input(s): CKTOTAL, CKMB, CKMBINDEX, TROPONINI in the last 168 hours. BNP (last 3 results) No results for input(s): BNP in the last 8760 hours.  ProBNP (last 3 results) No results for input(s): PROBNP in the last 8760 hours.  CBG: No results for input(s): GLUCAP in the last 168 hours.  Recent Results (from the past 240 hour(s))  Blood culture (routine x 2)     Status: None   Collection Time: 09/02/19  9:13 PM   Specimen: BLOOD  Result Value Ref Range Status   Specimen Description   Final    BLOOD LEFT ANTECUBITAL Performed at [facility], [address] [place], [place] [address]    Special Requests   Final    BOTTLES DRAWN AEROBIC AND ANAEROBIC Blood Culture results may not be optimal due to  an inadequate volume of blood received in culture bottles Performed at [place] [address] [place], [place] [address]    Culture   Final    NO GROWTH 5 DAYS Performed at [facility], [place] [address], [place], [place] [address]    Report Status 09/07/2019 FINAL  Final  Blood culture (routine x 2)     Status: None   Collection Time: 09/02/19  9:13 PM   Specimen: BLOOD  Result Value Ref Range Status   Specimen Description   Final    BLOOD RIGHT ANTECUBITAL Performed at [place] [address] [place], [place] [address]    Special Requests   Final    BOTTLES DRAWN AEROBIC AND ANAEROBIC Blood Culture adequate volume Performed at [place] [address], [place], [place] [address]    Culture   Final    NO GROWTH 5 DAYS Performed at [facility], [place] [address], [place], [place] [address]    Report Status 09/07/2019 FINAL  Final  SARS CORONAVIRUS 2 (TAT 6-24 HRS)  Nasopharyngeal Nasopharyngeal Swab     Status: None   Collection Time: 09/02/19  9:13 PM   Specimen: Nasopharyngeal Swab  Result Value Ref Range Status   SARS Coronavirus 2 NEGATIVE NEGATIVE Final    Comment: (NOTE) SARS-CoV-2 target nucleic acids are NOT DETECTED. The SARS-CoV-2 RNA is generally detectable in upper and lower respiratory specimens during the acute phase of infection. Negative results do not preclude SARS-CoV-2 infection, do not rule out co-infections with other pathogens, and should not be used as the sole basis for treatment or other patient management decisions. Negative results must be combined with clinical observations, patient history, and epidemiological information. The expected result is Negative. Fact Sheet for Patients: SugarRoll.be Fact Sheet for Healthcare Providers: https://www.woods-mathews.com/ This test is not yet approved or cleared by the [place] FDA and  has been authorized for detection and/or diagnosis of SARS-CoV-2 by FDA under an Emergency Use Authorization (EUA). This EUA will remain  in effect (meaning this test can be used) for the duration of the COVID-19 declaration under Section 56 4(b)(1) of the Act, 21 U.S.C. section 360bbb-3(b)(1), unless the authorization is terminated or revoked sooner. Performed at [facility], [place] [address] [place], [place] [address]   MRSA PCR Screening     Status: Abnormal   Collection Time: 09/02/19 10:57 PM   Specimen: Nasal Mucosa; Nasopharyngeal  Result Value Ref Range Status   MRSA by PCR POSITIVE (A) NEGATIVE Final    Comment:        The GeneXpert MRSA Assay (FDA approved for NASAL specimens only), is one component of a comprehensive MRSA colonization surveillance program. It is not intended to diagnose MRSA infection nor to guide or monitor treatment for MRSA infections. CRITICAL RESULT CALLED TO, READ BACK BY AND VERIFIED WITH: RN  [place] AT 7035 09/03/19 CRUICKSHANK A Performed at [facility], [place] [address] [place], [place] [address]   Aerobic/Anaerobic Culture (surgical/deep wound)     Status: None   Collection Time: 09/04/19 11:09 AM   Specimen: PATH Other; Tissue  Result Value Ref Range Status   Specimen Description   Final    ABSCESS NECK Performed at [facility], [place] [address] [place], [place] [address]    Special Requests   Final    NONE Performed at [facility], [place] [address] [place], [place] [address]  Gram Stain   Final    RARE WBC PRESENT, PREDOMINANTLY PMN RARE GRAM POSITIVE COCCI    Culture   Final    MODERATE METHICILLIN RESISTANT STAPHYLOCOCCUS AUREUS NO ANAEROBES ISOLATED Performed at [facility], [address] [place], [place] [address]    Report Status 09/09/2019 FINAL  Final   Organism ID, Bacteria METHICILLIN RESISTANT STAPHYLOCOCCUS AUREUS  Final      Susceptibility   Methicillin resistant staphylococcus aureus - MIC*    CIPROFLOXACIN >=8 RESISTANT Resistant     ERYTHROMYCIN >=8 RESISTANT Resistant     GENTAMICIN <=0.5 SENSITIVE Sensitive     OXACILLIN >=4 RESISTANT Resistant     TETRACYCLINE <=1 SENSITIVE Sensitive     VANCOMYCIN <=0.5 SENSITIVE Sensitive     TRIMETH/SULFA <=10 SENSITIVE Sensitive     CLINDAMYCIN <=0.25 SENSITIVE Sensitive     RIFAMPIN <=0.5 SENSITIVE Sensitive     Inducible Clindamycin NEGATIVE Sensitive     * MODERATE METHICILLIN RESISTANT STAPHYLOCOCCUS AUREUS     Studies: [place] Abdomen Limited Ruq  Result Date: 09/11/2019 CLINICAL DATA:  LFT elevation, AMS EXAM: ULTRASOUND ABDOMEN LIMITED RIGHT UPPER QUADRANT COMPARISON:  None. FINDINGS: Gallbladder: Status post cholecystectomy.  Negative sonographic Murphy sign. Common bile duct: Diameter: 7 mm Liver: No focal lesion identified. Mildly increased parenchymal echogenicity. Portal vein is patent on color Doppler imaging with normal direction of  blood flow towards the liver. Other: None. IMPRESSION: 1.  Status post cholecystectomy.  No biliary ductal dilatation. 2. Mildly increased hepatic parenchymal echogenicity, in keeping with hepatic steatosis. Electronically Signed   By: Lauralyn PrimesAlex  Bibbey M.D.   On: 09/11/2019 14:45    Scheduled Meds: . aspirin EC  81 mg Oral Daily  . cholecalciferol  2,000 Units Oral Daily  . doxycycline  100 mg Oral Q12H  . enoxaparin (LOVENOX) injection  40 mg Subcutaneous QHS  . iron polysaccharides  150 mg Oral Q lunch  . lidocaine  1 patch Transdermal Q24H  . OLANZapine  2.5 mg Oral QHS  . vitamin B-12  1,000 mcg Oral Daily    Continuous Infusions:    Time spent: 35mins I have personally reviewed and interpreted on  09/11/2019 daily labs,  imagings as discussed above under date review session and assessment and plans.  I reviewed all nursing notes, pharmacy notes, consultant notes,  vitals, pertinent old records    Albertine GratesFang Roshawnda Pecora MD, PhD, FACP  [facility]  If 7PM-7AM, please contact night-coverage at [email or web address], password [email or web address] 09/11/2019, 4:23 PM  LOS: 9 days

## 2019-09-12 ENCOUNTER — Encounter (HOSPITAL_COMMUNITY): Payer: Self-pay

## 2019-09-12 LAB — COMPREHENSIVE METABOLIC PANEL
ALT: 68 U/L — ABNORMAL HIGH (ref 0–44)
AST: 121 U/L — ABNORMAL HIGH (ref 15–41)
Albumin: 3 g/dL — ABNORMAL LOW (ref 3.5–5.0)
Alkaline Phosphatase: 81 U/L (ref 38–126)
Anion gap: 11 (ref 5–15)
BUN: 14 mg/dL (ref 8–23)
CO2: 25 mmol/L (ref 22–32)
Calcium: 8.4 mg/dL — ABNORMAL LOW (ref 8.9–10.3)
Chloride: 104 mmol/L (ref 98–111)
Creatinine, Ser: 1.04 mg/dL (ref 0.61–1.24)
GFR calc Af Amer: >60 mL/min (ref >60)
GFR calc non Af Amer: >60 mL/min (ref >60)
Glucose, Bld: 102 mg/dL — ABNORMAL HIGH (ref 70–99)
Potassium: 3.5 mmol/L (ref 3.5–5.1)
Sodium: 140 mmol/L (ref 135–145)
Total Bilirubin: 1.1 mg/dL (ref 0.3–1.2)
Total Protein: 6.7 g/dL (ref 6.5–8.1)

## 2019-09-12 LAB — CBC WITH DIFFERENTIAL/PLATELET
Abs Immature Granulocytes: 0.27 10*3/uL — ABNORMAL HIGH (ref 0.00–0.07)
Basophils Absolute: 0.1 10*3/uL (ref 0.0–0.1)
Basophils Relative: 1 %
Eosinophils Absolute: 0.2 10*3/uL (ref 0.0–0.5)
Eosinophils Relative: 1 %
HCT: 42.7 % (ref 39.0–52.0)
Hemoglobin: 13.8 g/dL (ref 13.0–17.0)
Immature Granulocytes: 2 %
Lymphocytes Relative: 34 %
Lymphs Abs: 5.4 10*3/uL — ABNORMAL HIGH (ref 0.7–4.0)
MCH: 31 pg (ref 26.0–34.0)
MCHC: 32.3 g/dL (ref 30.0–36.0)
MCV: 96 fL (ref 80.0–100.0)
Monocytes Absolute: 1.2 10*3/uL — ABNORMAL HIGH (ref 0.1–1.0)
Monocytes Relative: 7 %
Neutro Abs: 8.6 10*3/uL — ABNORMAL HIGH (ref 1.7–7.7)
Neutrophils Relative %: 55 %
Platelets: 393 10*3/uL (ref 150–400)
RBC: 4.45 MIL/uL (ref 4.22–5.81)
RDW: 14.4 % (ref 11.5–15.5)
WBC: 15.8 10*3/uL — ABNORMAL HIGH (ref 4.0–10.5)
nRBC: 0 % (ref 0.0–0.2)

## 2019-09-12 MED ORDER — ACETAMINOPHEN 500 MG PO TABS: 1000.0000 mg | ORAL_TABLET | Freq: Once | ORAL | Status: DC

## 2019-09-12 MED ORDER — POTASSIUM CHLORIDE CRYS ER 20 MEQ PO TBCR
40.0000 meq | EXTENDED_RELEASE_TABLET | Freq: Once | ORAL | Status: AC
  Administered 2019-09-12: 40 meq via ORAL
  Filled 2019-09-12: qty 2

## 2019-09-12 NOTE — Progress Notes (Signed)
PROGRESS NOTE  Kristopher HurdleJohn F Evans ZOX:[id number] DOB: 12/28/1934 DOA: 09/02/2019 PCP: Patient, No Pcp Per  HPI/Recap of past 24 hours: Has intermittent agitation, appeared to respond to Benadryl He received Benadryl this morning, is currently sleepy  No fever, vital signs are stable  He received 1 dose of Lasix yesterday ,urine output 2.6 L documented last 24 hours ,  Assessment/Plan: Principal Problem:   Neck abscess Active Problems:   Dementia (HCC)   HTN (hypertension)   Leucocytosis   Pressure injury of skin   Hypokalemia   Normocytic anemia   Acute respiratory failure with hypoxia (HCC)   Hypophosphatemia     Left neck abscess (presenting symptom) -He was sent from memory care unit to the ED due to left neck abscess, has significant leukocytosis on presentation -Blood culture no growth -ENT consulted, patient underwent I&D in OR on November 20th, wound culture growing MRSA -He has been on  IV vancomycin fro 6 days post op, neck wound healing well, transitioned  to doxycycline on 11/26  for another 4 days, last dose November 29   Persistent leukocytosis, neutrophil percentage range from 49% to 65%, lymphocyte percentage ranged from 24% to 39% -neck wound healing well, does not appear to have worsening of existing infection or developing new infection -abx de-escalated on 11/26, wbc slightly trending down today, lactic acid unremarkable -continue Monitor signs of infection --he might have atelectasis, he is not able to use incentive spirometer due to dementia, will start physical therapy, out of bed. -WBC seems to improve , continue to monitor , outpatient hematology follow-up recommended   LFT elevation: Ab us no acute findings, showed history of cholecystectomy, signs of hepatosteatosis. lfT possibly from liver congestion, received 1 dose of Lasix on 11/27, repeat labs in a.m. check ck Monitor   Acute hypoxic respiratory failure secondary to aspiration pneumonitis  tracheitis, hypoxia has resolved -has been on Rocephin and Flagyl for 4 days, Improving, on room air, will de-escalating abx -Diet recommendation per speech as below Diet recommendations: Dysphagia 2 (fine chop);Thin liquid Liquids provided via: Cup;No straw Medication Administration: Whole meds with puree Supervision: Staff to assist with self feeding;Full supervision/cueing for compensatory strategies Compensations: Slow rate;Small sips/bites;Follow solids with liquid(effortful swallow) Postural Changes and/or Swallow Maneuvers: Seated upright 90 degrees;Upright 30-60 min after meal  electrolyte abnormalities including hypokalemia, hyponatremia, hypophosphatemia Normalized, continue monitor.  Left subclavian stenosis: CT neck which was obtained to rule out Lemierre syndrome did not show any jugular vein thrombosis but revealed critical left subclavian stenosis 75 to 90% per radiology report.    By hospitalist colleague Dr. Lajuana RippleKamineni called and discussed with vascular surgery Dr. Edilia Boickson who recommended conservative management with aspirin.  Per vascular surgery, can consider obtaining carotid duplex to assess for subclavian steal syndrome/reversal of carotid blood flow if patient complains of dizziness (unlikely to ever know in this patient with advanced dementia).  Avoid blood pressure checks in left arm as can be falsely low.  Lipid profile hdl 31, ldl 73  Delirium with history of dementia -He did not respond to Haldol or trazodone per previous report -he is currently on low-dose Seroquel at nighttime,  QTC is prolonged, d/ced seroquel, changed to zyprexa on 11/26, QTC has improved after stopping Seroquel, patient appeared to tolerate Zyprexa so far, safety sitter as needed -We will avoid benzo -Appears to responded to Benadryl  Stage I pressure ulcer right buttock presented on admission  H/o inguinal hernia, stable, no pain  Failure to thrive,  He is  from memory care unit,  physical  therapy recommend snf or return to memory care if they can arrange PT  DVT Prophylaxis: subQ lovenox  Code Status: DNR  Family Communication: patient   Disposition Plan: return to memory care unit with home health vs SNF Social worker consulted, awaiting for bed  Consultants:  ENT  Phone conversation with Vascular surgery  Procedures:  Left neck abscess I&D in OR by ENT  Antibiotics:  As above   Objective: BP (!) 124/54 (BP Location: Left Arm)   Pulse (!) 53   Temp 98.2 F (36.8 C)   Resp 16   Ht  (1.676 m)   Wt 58.9 kg   SpO2 98%   BMI 20.96 kg/m   Intake/Output Summary (Last 24 hours) at 09/12/2019 1353 Last data filed at 09/12/2019 1336 Gross per 24 hour  Intake 110 ml  Output 1450 ml  Net -1340 ml   Filed Weights   09/03/19 0528  Weight: 58.9 kg    Exam: Patient is examined daily including today on 09/12/2019, exams remain the same as of yesterday except that has changed    General: Baseline dementia ,sleepy  Cardiovascular: RRR  Respiratory: Diminished at bases  Abdomen: Soft/ND/NT, positive BS  Musculoskeletal: No Edema  Neuro: sleepy, not oriented   Data Reviewed: Basic Metabolic Panel: Recent Labs  Lab 09/06/19 0506 09/07/19 0307 09/08/19 0358 09/09/19 0640 09/10/19 0257 09/11/19 0307 09/12/19 0255  NA 137 139 142 141 137 139 140  K 3.2* 3.7 3.5 3.0* 3.4* 4.4 3.5  CL 99 103 105 106 104 106 104  CO2 GLUCOSE 92 102* 86 93 86 89 102*  BUN CREATININE 1.14 0.84 0.83 0.97 0.89 0.87 1.04  CALCIUM 8.3* 8.3* 8.4* 8.0* 7.8* 8.4* 8.4*  MG 2.3 2.2 2.2  --  2.0  --   --   PHOS 3.8 2.4* 3.0  --  2.5  --   --    Liver Function Tests: Recent Labs  Lab 09/07/19 0307 09/08/19 0358 09/10/19 0257 09/11/19 0307 09/12/19 0255  AST 49* 66* 71* 123* 121*  ALT 33 39 38 59* 68*  ALKPHOS 100 92 76 93 81  BILITOT 1.0 0.6 0.7 0.6 1.1  PROT 7.0 6.3* 6.1* 6.9 6.7  ALBUMIN 3.0* 2.6* 2.7* 3.0*  3.0*   No results for input(s): LIPASE, AMYLASE in the last 168 hours. No results for input(s): AMMONIA in the last 168 hours. CBC: Recent Labs  Lab 09/06/19 0506 09/07/19 0307 09/08/19 0358 09/09/19 0640 09/10/19 0257 09/11/19 0307 09/12/19 0255  WBC 20.1* 19.1* 16.4* 14.9* 21.1* 18.0* 15.8*  NEUTROABS 11.9* 12.4* 8.8*  --   --  8.9* 8.6*  HGB 12.9* 14.0 13.3 12.0* 12.3* 14.0 13.8  HCT 40.8 44.0 41.8 38.0* 38.8* 44.5 42.7  MCV 96.7 96.9 96.5 97.4 96.3 96.9 96.0  PLT 303 332 325 293 319 392 393   Cardiac Enzymes:   No results for input(s): CKTOTAL, CKMB, CKMBINDEX, TROPONINI in the last 168 hours. BNP (last 3 results) No results for input(s): BNP in the last 8760 hours.  ProBNP (last 3 results) No results for input(s): PROBNP in the last 8760 hours.  CBG: No results for input(s): GLUCAP in the last 168 hours.  Recent Results (from the past 240 hour(s))  Blood culture (routine x 2)     Status: None   Collection Time: 09/02/19  9:13 PM  Specimen: BLOOD  Result Value Ref Range Status   Specimen Description   Final    BLOOD LEFT ANTECUBITAL Performed at [facility] [address], [place], [place] [address]    Special Requests   Final    BOTTLES DRAWN AEROBIC AND ANAEROBIC Blood Culture results may not be optimal due to an inadequate volume of blood received in culture bottles Performed at [facility] [address], [place], [place] [address]    Culture   Final    NO GROWTH 5 DAYS Performed at [facility], [place] [address], [place], [place] [address]    Report Status 09/07/2019 FINAL  Final  Blood culture (routine x 2)     Status: None   Collection Time: 09/02/19  9:13 PM   Specimen: BLOOD  Result Value Ref Range Status   Specimen Description   Final    BLOOD RIGHT ANTECUBITAL Performed at [facility] [address], [place], [place] [address]    Special Requests   Final    BOTTLES DRAWN  AEROBIC AND ANAEROBIC Blood Culture adequate volume Performed at [facility] [address] [place], [place] [address]    Culture   Final    NO GROWTH 5 DAYS Performed at [facility], [place] [address], [place], [place] [address]    Report Status 09/07/2019 FINAL  Final  SARS CORONAVIRUS 2 (TAT 6-24 HRS) Nasopharyngeal Nasopharyngeal Swab     Status: None   Collection Time: 09/02/19  9:13 PM   Specimen: Nasopharyngeal Swab  Result Value Ref Range Status   SARS Coronavirus 2 NEGATIVE NEGATIVE Final    Comment: (NOTE) SARS-CoV-2 target nucleic acids are NOT DETECTED. The SARS-CoV-2 RNA is generally detectable in upper and lower respiratory specimens during the acute phase of infection. Negative results do not preclude SARS-CoV-2 infection, do not rule out co-infections with other pathogens, and should not be used as the sole basis for treatment or other patient management decisions. Negative results must be combined with clinical observations, patient history, and epidemiological information. The expected result is Negative. Fact Sheet for Patients: SugarRoll.be Fact Sheet for Healthcare Providers: https://www.woods-mathews.com/ This test is not yet approved or cleared by the [place] FDA and  has been authorized for detection and/or diagnosis of SARS-CoV-2 by FDA under an Emergency Use Authorization (EUA). This EUA will remain  in effect (meaning this test can be used) for the duration of the COVID-19 declaration under Section 56 4(b)(1) of the Act, 21 U.S.C. section 360bbb-3(b)(1), unless the authorization is terminated or revoked sooner. Performed at [facility], [place] [address], [place], [place] [address]   MRSA PCR Screening     Status: Abnormal   Collection Time: 09/02/19 10:57 PM   Specimen: Nasal Mucosa; Nasopharyngeal  Result Value Ref Range Status   MRSA by PCR POSITIVE (A) NEGATIVE Final     Comment:        The GeneXpert MRSA Assay (FDA approved for NASAL specimens only), is one component of a comprehensive MRSA colonization surveillance program. It is not intended to diagnose MRSA infection nor to guide or monitor treatment for MRSA infections. CRITICAL RESULT CALLED TO, READ BACK BY AND VERIFIED WITH: RN VIRGINIA AT 8127 09/03/19 CRUICKSHANK A Performed at [facility], [place] [address], [place], [place] [address]   Aerobic/Anaerobic Culture (surgical/deep wound)     Status: None   Collection Time: 09/04/19 11:09 AM   Specimen:  PATH Other; Tissue  Result Value Ref Range Status   Specimen Description   Final    ABSCESS NECK Performed at [facility], [address], [place], [place] [address]    Special Requests   Final    NONE Performed at [facility], [address], [place], [place] [address]    Gram Stain   Final    RARE WBC PRESENT, PREDOMINANTLY PMN RARE GRAM POSITIVE COCCI    Culture   Final    MODERATE METHICILLIN RESISTANT STAPHYLOCOCCUS AUREUS NO ANAEROBES ISOLATED Performed at [facility], [address], [place], [place] [address]    Report Status 09/09/2019 FINAL  Final   Organism ID, Bacteria METHICILLIN RESISTANT STAPHYLOCOCCUS AUREUS  Final      Susceptibility   Methicillin resistant staphylococcus aureus - MIC*    CIPROFLOXACIN >=8 RESISTANT Resistant     ERYTHROMYCIN >=8 RESISTANT Resistant     GENTAMICIN <=0.5 SENSITIVE Sensitive     OXACILLIN >=4 RESISTANT Resistant     TETRACYCLINE <=1 SENSITIVE Sensitive     VANCOMYCIN <=0.5 SENSITIVE Sensitive     TRIMETH/SULFA <=10 SENSITIVE Sensitive     CLINDAMYCIN <=0.25 SENSITIVE Sensitive     RIFAMPIN <=0.5 SENSITIVE Sensitive     Inducible Clindamycin NEGATIVE Sensitive     * MODERATE METHICILLIN RESISTANT STAPHYLOCOCCUS AUREUS     Studies: US Abdomen Limited Ruq  Result Date: 09/11/2019 CLINICAL DATA:  LFT elevation, AMS EXAM:  ULTRASOUND ABDOMEN LIMITED RIGHT UPPER QUADRANT COMPARISON:  None. FINDINGS: Gallbladder: Status post cholecystectomy.  Negative sonographic Murphy sign. Common bile duct: Diameter: 7 mm Liver: No focal lesion identified. Mildly increased parenchymal echogenicity. Portal vein is patent on color Doppler imaging with normal direction of blood flow towards the liver. Other: None. IMPRESSION: 1.  Status post cholecystectomy.  No biliary ductal dilatation. 2. Mildly increased hepatic parenchymal echogenicity, in keeping with hepatic steatosis. Electronically Signed   By: Lauralyn Primes M.D.   On: 09/11/2019 14:45    Scheduled Meds: . acetaminophen  1,000 mg Oral Once  . aspirin EC  81 mg Oral Daily  . cholecalciferol  2,000 Units Oral Daily  . doxycycline  100 mg Oral Q12H  . enoxaparin (LOVENOX) injection  40 mg Subcutaneous QHS  . iron polysaccharides  150 mg Oral Q lunch  . lidocaine  1 patch Transdermal Q24H  . OLANZapine  2.5 mg Oral QHS  . vitamin B-12  1,000 mcg Oral Daily    Continuous Infusions:    Time spent: I have personally reviewed and interpreted on  09/12/2019 daily labs,  imagings as discussed above under date review session and assessment and plans.  I reviewed all nursing notes, pharmacy notes, consultant notes,  vitals, pertinent old records    Albertine Grates MD, PhD, FACP  [facility]  If 7PM-7AM, please contact night-coverage at [email or web address], password [facility] 09/12/2019, 1:53 PM  LOS: 10 days

## 2019-09-12 NOTE — Plan of Care (Signed)

## 2019-09-13 LAB — COMPREHENSIVE METABOLIC PANEL
ALT: 91 U/L — ABNORMAL HIGH (ref 0–44)
AST: 177 U/L — ABNORMAL HIGH (ref 15–41)
Albumin: 3.1 g/dL — ABNORMAL LOW (ref 3.5–5.0)
Alkaline Phosphatase: 85 U/L (ref 38–126)
Anion gap: 10 (ref 5–15)
BUN: 21 mg/dL (ref 8–23)
CO2: 26 mmol/L (ref 22–32)
Calcium: 8.8 mg/dL — ABNORMAL LOW (ref 8.9–10.3)
Chloride: 105 mmol/L (ref 98–111)
Creatinine, Ser: 1.07 mg/dL (ref 0.61–1.24)
GFR calc Af Amer: >60 mL/min (ref >60)
GFR calc non Af Amer: >60 mL/min (ref >60)
Glucose, Bld: 94 mg/dL (ref 70–99)
Potassium: 4 mmol/L (ref 3.5–5.1)
Sodium: 141 mmol/L (ref 135–145)
Total Bilirubin: 0.7 mg/dL (ref 0.3–1.2)
Total Protein: 7.1 g/dL (ref 6.5–8.1)

## 2019-09-13 LAB — CBC WITH DIFFERENTIAL/PLATELET
Abs Immature Granulocytes: 0.21 10*3/uL — ABNORMAL HIGH (ref 0.00–0.07)
Basophils Absolute: 0.1 10*3/uL (ref 0.0–0.1)
Basophils Relative: 1 %
Eosinophils Absolute: 0.2 10*3/uL (ref 0.0–0.5)
Eosinophils Relative: 2 %
HCT: 47.2 % (ref 39.0–52.0)
Hemoglobin: 14.9 g/dL (ref 13.0–17.0)
Immature Granulocytes: 1 %
Lymphocytes Relative: 34 %
Lymphs Abs: 5 10*3/uL — ABNORMAL HIGH (ref 0.7–4.0)
MCH: 30.5 pg (ref 26.0–34.0)
MCHC: 31.6 g/dL (ref 30.0–36.0)
MCV: 96.5 fL (ref 80.0–100.0)
Monocytes Absolute: 0.9 10*3/uL (ref 0.1–1.0)
Monocytes Relative: 7 %
Neutro Abs: 8.1 10*3/uL — ABNORMAL HIGH (ref 1.7–7.7)
Neutrophils Relative %: 55 %
Platelets: 435 10*3/uL — ABNORMAL HIGH (ref 150–400)
RBC: 4.89 MIL/uL (ref 4.22–5.81)
RDW: 14.7 % (ref 11.5–15.5)
WBC: 14.5 10*3/uL — ABNORMAL HIGH (ref 4.0–10.5)
nRBC: 0 % (ref 0.0–0.2)

## 2019-09-13 LAB — CK: Total CK: 747 U/L — ABNORMAL HIGH (ref 49–397)

## 2019-09-13 LAB — HEPATITIS PANEL, ACUTE
HCV Ab: NONREACTIVE
Hep A IgM: NONREACTIVE
Hep B C IgM: NONREACTIVE
Hepatitis B Surface Ag: NONREACTIVE

## 2019-09-13 LAB — BRAIN NATRIURETIC PEPTIDE: B Natriuretic Peptide: 80.9 pg/mL (ref 0.0–100.0)

## 2019-09-13 LAB — AMMONIA: Ammonia: 22 umol/L (ref 9–35)

## 2019-09-13 LAB — MAGNESIUM: Magnesium: 2 mg/dL (ref 1.7–2.4)

## 2019-09-13 MED ORDER — FUROSEMIDE 10 MG/ML IJ SOLN
40.0000 mg | Freq: Once | INTRAMUSCULAR | Status: DC
  Filled 2019-09-13: qty 4

## 2019-09-13 NOTE — Progress Notes (Addendum)
PROGRESS NOTE  Kristopher HurdleJohn F Ruda ZOX:[id number] DOB: 06/05/1935 DOA: 09/02/2019 PCP: Patient, No Pcp Per  Brief summary:  History of dementia from memory care admitted with neck abscess-drained by ENT.   Hospital course complicated by delirium, aspiration pneumonia, leukocytosis, elevated lft. QTC prolongation Monitor lft, monitor Qtc awaiting for snf  Placement     HPI/Recap of past 24 hours: Has intermittent agitation, appeared to respond to Benadryl He is alert this am  No fever, vital signs are stable   Assessment/Plan: Principal Problem:   Neck abscess Active Problems:   Dementia (HCC)   HTN (hypertension)   Leucocytosis   Pressure injury of skin   Hypokalemia   Normocytic anemia   Acute respiratory failure with hypoxia (HCC)   Hypophosphatemia     Left neck abscess (presenting symptom) -He was sent from memory care unit to the ED due to left neck abscess, has significant leukocytosis on presentation -Blood culture no growth -ENT consulted, patient underwent I&D in OR on November 20th, wound culture growing MRSA -He has been on  IV vancomycin fro 6 days post op, neck wound healing well, transitioned  to doxycycline on 11/26  for another 4 days, last dose November 29   Persistent leukocytosis, neutrophil percentage range from 49% to 65%, lymphocyte percentage ranged from 24% to 39% -neck wound healing well, does not appear to have worsening of existing infection or developing new infection -abx de-escalated on 11/26, wbc start to trenddown , lactic acid unremarkable -continue Monitor signs of infection --he might have atelectasis, he is not able to use incentive spirometer due to dementia, will start physical therapy, out of bed. -continue to monitor , outpatient hematology follow-up recommended   LFT elevation: Ab us no acute findings, showed history of cholecystectomy, signs of hepatosteatosis. I have reviewed meds with pharmacy, does not appear he is on  hepatotoxic meds,  Ck mildly elevated lfT possibly from liver congestion? Though on exam does not appear volume overloaded He has received ivf and multiple iv abx during hospitalization,  received 1 dose of Lasix on 11/27,  lft continue to trend up with no clear reason, will try another dose of lasix on 11/29, add on hepatitis panel, bnp for completeness,  Get daily weight  Monitor   Acute hypoxic respiratory failure secondary to aspiration pneumonitis tracheitis, hypoxia has resolved -has been on Rocephin and Flagyl for 4 days, Improving, on room air, de-escalating abx -Diet recommendation per speech as below Diet recommendations: Dysphagia 2 (fine chop);Thin liquid Liquids provided via: Cup;No straw Medication Administration: Whole meds with puree Supervision: Staff to assist with self feeding;Full supervision/cueing for compensatory strategies Compensations: Slow rate;Small sips/bites;Follow solids with liquid(effortful swallow) Postural Changes and/or Swallow Maneuvers: Seated upright 90 degrees;Upright 30-60 min after meal  electrolyte abnormalities including hypokalemia, hyponatremia, hypophosphatemia Normalized, continue monitor.   QTC prolongation -Monitor electrolyte -QTC has improved after stopping Seroquel, decreased from 501 to 490, EKG ordered to be done on 11/29 am , can not locate the EKG in chart, will reorder EKG for Qtc monitoring, please follow up on result  Left subclavian stenosis: CT neck which was obtained to rule out Lemierre syndrome did not show any jugular vein thrombosis but revealed critical left subclavian stenosis 75 to 90% per radiology report.    By hospitalist colleague Dr. Lajuana RippleKamineni called and discussed with vascular surgery Dr. Edilia Boickson who recommended conservative management with aspirin.  Per vascular surgery, can consider obtaining carotid duplex to assess for subclavian steal syndrome/reversal of carotid blood  flow if patient complains of dizziness  (unlikely to ever know in this patient with advanced dementia).  Avoid blood pressure checks in left arm as can be falsely low.  Lipid profile hdl 31, ldl 73  Delirium with history of dementia -He did not respond to Haldol or trazodone per previous report -he is currently on low-dose Seroquel at nighttime,  QTC is prolonged, d/ced seroquel, changed to zyprexa on 11/26,   patient appeared to tolerate Zyprexa so far, safety sitter as needed -We will avoid benzo -Appears to responded to Benadryl  Stage I pressure ulcer right buttock presented on admission  H/ounilateral  inguinal hernia (right), stable, no pain  Failure to thrive,  He is from memory care unit,  physical therapy recommend snf or return to memory care if they can arrange PT  DVT Prophylaxis: subQ lovenox  Code Status: DNR  Family Communication: patient   Disposition Plan: SNF Social worker consulted, awaiting for bed  Consultants:  ENT  Phone conversation with Vascular surgery  Procedures:  Left neck abscess I&D in OR by ENT  Antibiotics:  As above   Objective: BP (!) 145/68 (BP Location: Left Arm)    Pulse 60    Temp 98.4 F (36.9 C) (Oral)    Resp 18    Ht 5\' 6"  (1.676 m)    Wt 58.9 kg    SpO2 99%    BMI 20.96 kg/m   Intake/Output Summary (Last 24 hours) at 09/13/2019 1132 Last data filed at 09/13/2019 0600 Gross per 24 hour  Intake 360 ml  Output 1201 ml  Net -841 ml   Filed Weights   09/03/19 0528  Weight: 58.9 kg    Exam: Patient is examined daily including today on 09/13/2019, exams remain the same as of yesterday except that has changed    General: Baseline dementia ,alert  Cardiovascular: RRR  Respiratory: Diminished at bases  Abdomen: Soft/ND/NT, positive BS, right inguinal hernia, nontender  Musculoskeletal: No Edema  Neuro: alert, not oriented   Data Reviewed: Basic Metabolic Panel: Recent Labs  Lab 09/07/19 0307 09/08/19 0358 09/09/19 0640 09/10/19 0257  09/11/19 0307 09/12/19 0255 09/13/19 0338  NA 139 142 141 137 139 140 141  K 3.7 3.5 3.0* 3.4* 4.4 3.5 4.0  CL 103 105 106 104 106 104 105  CO2 27 25 28 26 25 25 26   GLUCOSE 102* 86 93 86 89 102* 94  BUN 19 17 17 13 12 14 21   CREATININE 0.84 0.83 0.97 0.89 0.87 1.04 1.07  CALCIUM 8.3* 8.4* 8.0* 7.8* 8.4* 8.4* 8.8*  MG 2.2 2.2  --  2.0  --   --  2.0  PHOS 2.4* 3.0  --  2.5  --   --   --    Liver Function Tests: Recent Labs  Lab 09/08/19 0358 09/10/19 0257 09/11/19 0307 09/12/19 0255 09/13/19 0338  AST 66* 71* 123* 121* 177*  ALT 39 38 59* 68* 91*  ALKPHOS 92 76 93 81 85  BILITOT 0.6 0.7 0.6 1.1 0.7  PROT 6.3* 6.1* 6.9 6.7 7.1  ALBUMIN 2.6* 2.7* 3.0* 3.0* 3.1*   No results for input(s): LIPASE, AMYLASE in the last 168 hours. Recent Labs  Lab 09/13/19 0338  AMMONIA 22   CBC: Recent Labs  Lab 09/07/19 0307 09/08/19 0358 09/09/19 0640 09/10/19 0257 09/11/19 0307 09/12/19 0255 09/13/19 0338  WBC 19.1* 16.4* 14.9* 21.1* 18.0* 15.8* 14.5*  NEUTROABS 12.4* 8.8*  --   --  8.9* 8.6* 8.1*  HGB 14.0 13.3 12.0* 12.3* 14.0 13.8 14.9  HCT 44.0 41.8 38.0* 38.8* 44.5 42.7 47.2  MCV 96.9 96.5 97.4 96.3 96.9 96.0 96.5  PLT 332 325 293 319 392 393 435*   Cardiac Enzymes:   Recent Labs  Lab 09/13/19 0338  CKTOTAL 747*   BNP (last 3 results) No results for input(s): BNP in the last 8760 hours.  ProBNP (last 3 results) No results for input(s): PROBNP in the last 8760 hours.  CBG: No results for input(s): GLUCAP in the last 168 hours.  Recent Results (from the past 240 hour(s))  Aerobic/Anaerobic Culture (surgical/deep wound)     Status: None   Collection Time: 09/04/19 11:09 AM   Specimen: PATH Other; Tissue  Result Value Ref Range Status   Specimen Description   Final    ABSCESS NECK Performed at [facility], [address] [place], [place] [address]    Special Requests   Final    NONE Performed at [facility], [place] [address]  [place], [place] [address]    Gram Stain   Final    RARE WBC PRESENT, PREDOMINANTLY PMN RARE GRAM POSITIVE COCCI    Culture   Final    MODERATE METHICILLIN RESISTANT STAPHYLOCOCCUS AUREUS NO ANAEROBES ISOLATED Performed at [facility], [place] [address] [place], [place] [address]    Report Status 09/09/2019 FINAL  Final   Organism ID, Bacteria METHICILLIN RESISTANT STAPHYLOCOCCUS AUREUS  Final      Susceptibility   Methicillin resistant staphylococcus aureus - MIC*    CIPROFLOXACIN >=8 RESISTANT Resistant     ERYTHROMYCIN >=8 RESISTANT Resistant     GENTAMICIN <=0.5 SENSITIVE Sensitive     OXACILLIN >=4 RESISTANT Resistant     TETRACYCLINE <=1 SENSITIVE Sensitive     VANCOMYCIN <=0.5 SENSITIVE Sensitive     TRIMETH/SULFA <=10 SENSITIVE Sensitive     CLINDAMYCIN <=0.25 SENSITIVE Sensitive     RIFAMPIN <=0.5 SENSITIVE Sensitive     Inducible Clindamycin NEGATIVE Sensitive     * MODERATE METHICILLIN RESISTANT STAPHYLOCOCCUS AUREUS     Studies: No results found.  Scheduled Meds:  acetaminophen  1,000 mg Oral Once   aspirin EC  81 mg Oral Daily   cholecalciferol  2,000 Units Oral Daily   doxycycline  100 mg Oral Q12H   enoxaparin (LOVENOX) injection  40 mg Subcutaneous QHS   iron polysaccharides  150 mg Oral Q lunch   lidocaine  1 patch Transdermal Q24H   OLANZapine  2.5 mg Oral QHS   vitamin B-12  1,000 mcg Oral Daily    Continuous Infusions:    Time spent: 31mins I have personally reviewed and interpreted on  09/13/2019 daily labs,  imagings as discussed above under date review session and assessment and plans.  I reviewed all nursing notes, pharmacy notes, consultant notes,  vitals, pertinent old records    Florencia Reasons MD, PhD, [facility]  If 7PM-7AM, please contact night-coverage at [email or web address], password [facility] 09/13/2019, 11:32 AM  LOS: 11 days

## 2019-09-13 NOTE — Plan of Care (Signed)
Patient lying in bed this morning; confused. Verbal, but difficult to understand. Does not endorse pain at this time. Will continue to monitor.

## 2019-09-14 ENCOUNTER — Inpatient Hospital Stay (HOSPITAL_COMMUNITY): Payer: PPO

## 2019-09-14 LAB — CBC WITH DIFFERENTIAL/PLATELET
Abs Immature Granulocytes: 0.17 10*3/uL — ABNORMAL HIGH (ref 0.00–0.07)
Basophils Absolute: 0.1 10*3/uL (ref 0.0–0.1)
Basophils Relative: 1 %
Eosinophils Absolute: 0.2 10*3/uL (ref 0.0–0.5)
Eosinophils Relative: 1 %
HCT: 45.8 % (ref 39.0–52.0)
Hemoglobin: 14.7 g/dL (ref 13.0–17.0)
Immature Granulocytes: 1 %
Lymphocytes Relative: 30 %
Lymphs Abs: 5.2 10*3/uL — ABNORMAL HIGH (ref 0.7–4.0)
MCH: 30.9 pg (ref 26.0–34.0)
MCHC: 32.1 g/dL (ref 30.0–36.0)
MCV: 96.2 fL (ref 80.0–100.0)
Monocytes Absolute: 1.3 10*3/uL — ABNORMAL HIGH (ref 0.1–1.0)
Monocytes Relative: 7 %
Neutro Abs: 10.5 10*3/uL — ABNORMAL HIGH (ref 1.7–7.7)
Neutrophils Relative %: 60 %
Platelets: 463 10*3/uL — ABNORMAL HIGH (ref 150–400)
RBC: 4.76 MIL/uL (ref 4.22–5.81)
RDW: 14.6 % (ref 11.5–15.5)
WBC: 17.5 10*3/uL — ABNORMAL HIGH (ref 4.0–10.5)
nRBC: 0 % (ref 0.0–0.2)

## 2019-09-14 LAB — COMPREHENSIVE METABOLIC PANEL
ALT: 91 U/L — ABNORMAL HIGH (ref 0–44)
AST: 134 U/L — ABNORMAL HIGH (ref 15–41)
Albumin: 3.6 g/dL (ref 3.5–5.0)
Alkaline Phosphatase: 87 U/L (ref 38–126)
Anion gap: 11 (ref 5–15)
BUN: 19 mg/dL (ref 8–23)
CO2: 27 mmol/L (ref 22–32)
Calcium: 9 mg/dL (ref 8.9–10.3)
Chloride: 104 mmol/L (ref 98–111)
Creatinine, Ser: 1.05 mg/dL (ref 0.61–1.24)
GFR calc Af Amer: >60 mL/min (ref >60)
GFR calc non Af Amer: >60 mL/min (ref >60)
Glucose, Bld: 95 mg/dL (ref 70–99)
Potassium: 4 mmol/L (ref 3.5–5.1)
Sodium: 142 mmol/L (ref 135–145)
Total Bilirubin: 1.1 mg/dL (ref 0.3–1.2)
Total Protein: 8 g/dL (ref 6.5–8.1)

## 2019-09-14 LAB — SARS CORONAVIRUS 2 (TAT 6-24 HRS): SARS Coronavirus 2: NEGATIVE

## 2019-09-14 LAB — PROCALCITONIN: Procalcitonin: <0.1 ng/mL

## 2019-09-14 MED ORDER — TERBINAFINE HCL 1 % EX CREA
TOPICAL_CREAM | Freq: Two times a day (BID) | CUTANEOUS | Status: DC
  Administered 2019-09-14 – 2019-09-15 (×3): via TOPICAL
  Filled 2019-09-14: qty 12

## 2019-09-14 NOTE — TOC Progression Note (Signed)
Transition of Care [facility]) - Progression Note    Patient Details  Name: Kristopher Evans MRN: [id number] Date of Birth: [birth year]  Transition of Care [facility]) CM/SW [place], [place] Phone Number: 09/14/2019, 2:03 PM  Clinical Narrative:    CSW reached out to [facility]) to discuss the patient current level of care. The facility Rep. Beverlee Nims, reports the patient ambulatory abilities (walking without assistance) has decline therefore request the patient go for short term rehab at [facility] before returning.  CSW reached out to the patient niece to discuss the patient disposition and the SNF process. Patient niece agreeable to SNF placement.  FL2 complete. Patient initial referral faxed.  HTA started, [facility] staff will call with a SNF choice once niece has chosen a SNF.   COVID-19 test pending.  PTAR authorization form to be completed for PTAR transport.    Expected Discharge Plan: Skilled Nursing Facility Barriers to Discharge: Insurance Authorization, Other (comment), SNF Pending bed offer(COVID-19 test pending)  Expected Discharge Plan and Services Expected Discharge Plan: [place] In-house Referral: Clinical Social Work Discharge Planning Services: CM Consult   Living arrangements for the past 2 months: [place]                                       Social Determinants of Health (SDOH) Interventions    Readmission Risk Interventions No flowsheet data found.

## 2019-09-14 NOTE — Plan of Care (Signed)

## 2019-09-14 NOTE — Progress Notes (Signed)
Physical Therapy Treatment Patient Details Name: Kristopher Evans MRN: [id number] DOB: 01-Dec-1934 Today's Date: 09/14/2019    History of Present Illness Patient is 84 y.o male s/p I&D of neck abscess on 09/04/19 with PMH significant for dimentia.    PT Comments    Pt was very groggy and lethargic. Pt was A&Ox1 only oriented to himself.  Pt alertness improved slightly over treatment. General bed mobility comments: Pt required max assist to sit up on the EOB. Pt was very sleepy/groggy. Pt was difficult to arouse. Wet wash cloth was given on EOB for him to wash his face General transfer comment: Pt reqired verbal and tactile cues to stand up from the bed. Pt required 2+ assist for power up and for posture/steadying once standing. General Gait Details: Pt required assistance to stand up tall and to guide the walker. Pt required 2+ physical assist to ambulate 45ft with the chair following. Pt is lacking hip and knee extension and presented with a forward flexed posture. Pt was left in chair eating breakfast.   Follow Up Recommendations  SNF;Supervision/Assistance - 24 hour     Equipment Recommendations  None recommended by PT    Recommendations for Other Services       Precautions / Restrictions Precautions Precautions: Fall Restrictions Weight Bearing Restrictions: No    Mobility  Bed Mobility Overal bed mobility: Needs Assistance Bed Mobility: Supine to Sit     Supine to sit: HOB elevated;Max assist     General bed mobility comments: Pt required max assist to sit up on the EOB. Pt was very sleepy/groggy. Pt was difficult to arouse. Wet wash cloth was given on EOB for him to wash his face  Transfers Overall transfer level: Needs assistance Equipment used: Rolling walker (2 wheeled) Transfers: Sit to/from Stand Sit to Stand: Mod assist;+2 physical assistance;+2 safety/equipment;From elevated surface         General transfer comment: Pt reqired verbal and tactile cues to stand  up from the bed. Pt required 2+ assist for power up and for posture/steadying once standing.  Ambulation/Gait Ambulation/Gait assistance: Mod assist;Max assist Gait Distance (Feet): 40 Feet Assistive device: Rolling walker (2 wheeled) Gait Pattern/deviations: Narrow base of support;Step-to pattern;Decreased stride length;Shuffle;Drifts right/left;Trunk flexed Gait velocity: slow and unsteady   General Gait Details: Pt required assistance to stand up tall and to guide the walker. Pt required 2+ physical assist to ambulate 60ft with the chair following. Pt is lacking hip and knee extension and presented with a forward flexed posture.   Stairs             Wheelchair Mobility    Modified Rankin (Stroke Patients Only)       Balance                                            Cognition Arousal/Alertness: Lethargic Behavior During Therapy: WFL for tasks assessed/performed                                   General Comments: Pt is oriented to self but not time, place, or situation. He is pleasantly confused throughout session and agreeable to participate in therapy. Pt has difficulty answering questions and is difficult to understand when he attempts to speak      Exercises  General Comments        Pertinent Vitals/Pain Pain Assessment: Faces Faces Pain Scale: No hurt Pain Intervention(s): Monitored during session;Repositioned    Home Living                      Prior Function            PT Goals (current goals can now be found in the care plan section) Progress towards PT goals: Progressing toward goals    Frequency    Min 2X/week      PT Plan Current plan remains appropriate    Co-evaluation              AM-PAC PT "6 Clicks" Mobility   Outcome Measure  Help needed turning from your back to your side while in a flat bed without using bedrails?: Total Help needed moving from lying on your back to  sitting on the side of a flat bed without using bedrails?: Total Help needed moving to and from a bed to a chair (including a wheelchair)?: A Lot Help needed standing up from a chair using your arms (e.g., wheelchair or bedside chair)?: A Lot Help needed to walk in hospital room?: A Lot Help needed climbing 3-5 steps with a railing? : Total 6 Click Score: 9    End of Session Equipment Utilized During Treatment: Gait belt Activity Tolerance: Patient limited by lethargy Patient left: in chair;with chair alarm set;with call bell/phone within reach Nurse Communication: Mobility status PT Visit Diagnosis: Muscle weakness (generalized) (M62.81);Unsteadiness on feet (R26.81);Difficulty in walking, not elsewhere classified (R26.2)     Time: 7517-0017 PT Time Calculation (min) (ACUTE ONLY): 29 min  Charges:  $Gait Training: 8-22 mins $Therapeutic Activity: 8-22 mins                     Marti Sleigh, SPTA [facility]

## 2019-09-14 NOTE — NC FL2 (Signed)
[place] MEDICAID FL2 LEVEL OF CARE SCREENING TOOL     IDENTIFICATION  Patient Name: Kristopher Evans Birthdate: 1935/03/06 Sex: male Admission Date (Current Location): 09/02/2019  [facility] and [place] Number:  Producer, television/film/video and Address:  [facility],  [address] [place] [address]      Provider Number: [id number]  Attending Physician Name and Address:  Hughie Closs, MD  Relative Name and Phone Number:  Graylon Good Niece [phone]    Current Level of Care: Hospital Recommended Level of Care: Skilled Nursing Facility Prior Approval Number:    Date Approved/Denied:   PASRR Number: [id number]  Discharge Plan: SNF    Current Diagnoses: Patient Active Problem List   Diagnosis Date Noted  . Acute respiratory failure with hypoxia (HCC) 09/05/2019  . Hypophosphatemia 09/05/2019  . Pressure injury of skin 09/04/2019  . Hypokalemia 09/04/2019  . Normocytic anemia 09/04/2019  . Neck abscess 09/02/2019  . Dementia (HCC) 09/02/2019  . HTN (hypertension) 09/02/2019  . Leucocytosis 09/02/2019    Orientation RESPIRATION BLADDER Height & Weight     Self  Normal Incontinent Weight: 123 lb 3.8 oz (55.9 kg) Height:  5\' 6"  (167.6 cm)  BEHAVIORAL SYMPTOMS/MOOD NEUROLOGICAL BOWEL NUTRITION STATUS      Incontinent (Dyshagia -Fine Chop -Puree)  AMBULATORY STATUS COMMUNICATION OF NEEDS Skin   Extensive Assist Verbally PU Stage and Appropriate Care, Surgical wounds(Stage I Pressure Injury-Buttock /Incision on Neck (Dressing changes PRN)) PU Stage 1 Dressing: No Dressing                     Personal Care Assistance Level of Assistance  Bathing, Dressing, Feeding Bathing Assistance: Maximum assistance Feeding assistance: Limited assistance Dressing Assistance: Maximum assistance     Functional Limitations Info  Sight, Speech, Hearing Sight Info: Adequate Hearing Info: Adequate Speech Info: Adequate    SPECIAL CARE FACTORS FREQUENCY  PT (By  licensed PT), OT (By licensed OT)     PT Frequency: 5x/week OT Frequency: 5x/week            Contractures Contractures Info: Not present    Additional Factors Info  Code Status, Allergies, Psychotropic Code Status Info: DNR Allergies Info: Allergies: Penicillins Psychotropic Info: Zyprexa         Current Medications (09/14/2019):  This is the current hospital active medication list Current Facility-Administered Medications  Medication Dose Route Frequency Provider Last Rate Last Dose  . acetaminophen (TYLENOL) tablet 650 mg  650 mg Oral Q6H PRN 09/16/2019, MD   650 mg at 09/05/19 0556   Or  . acetaminophen (TYLENOL) suppository 650 mg  650 mg Rectal Q6H PRN 09/07/19, MD      . acetaminophen (TYLENOL) tablet 1,000 mg  1,000 mg Oral Once Finucane, Elizabeth M, DO      . aspirin EC tablet 81 mg  81 mg Oral Daily M, MD   81 mg at 09/14/19 1031  . cholecalciferol (VITAMIN D3) tablet 2,000 Units  2,000 Units Oral Daily 09/16/19, MD   2,000 Units at 09/14/19 1031  . enoxaparin (LOVENOX) injection 40 mg  40 mg Subcutaneous QHS 09/16/19, MD   40 mg at 09/13/19 2114  . furosemide (LASIX) injection 40 mg  40 mg Intravenous Once 2115, MD      . guaiFENesin [facility]) 12 hr tablet 600 mg  600 mg Oral BID PRN [facility], NP   600 mg at 09/05/19 0556  . hydrALAZINE (APRESOLINE) tablet 25 mg  25 mg Oral Q6H PRN Jerrell Belfast, MD   25 mg at 09/11/19 0447  . hydrOXYzine (ATARAX/VISTARIL) tablet 25 mg  25 mg Oral TID PRN Guilford Shi, MD   25 mg at 09/13/19 1749  . ipratropium-albuterol (DUONEB) 0.5-2.5 (3) MG/3ML nebulizer solution 3 mL  3 mL Nebulization Q4H PRN Guilford Shi, MD   3 mL at 09/14/19 0331  . iron polysaccharides (NIFEREX) capsule 150 mg  150 mg Oral Q lunch Graylin Shiver L, RPH   150 mg at 09/13/19 1200  . labetalol (NORMODYNE) injection 10 mg  10 mg Intravenous Q2H PRN Raiford Noble [place], DO   10 mg at  09/07/19 0815  . lidocaine (LIDODERM) 5 % 1 patch  1 patch Transdermal Q24H Florencia Reasons, MD   1 patch at 09/12/19 1744  . liver oil-zinc oxide (DESITIN) 40 % ointment 1 application  1 application Topical PRN Jerrell Belfast, MD      . Doristine Counter [facility]) tablet 2.5 mg  2.5 mg Oral QHS Florencia Reasons, MD   2.5 mg at 09/13/19 2116  . ondansetron (ZOFRAN) tablet 4 mg  4 mg Oral Q6H PRN Jerrell Belfast, MD       Or  . ondansetron [facility]) injection 4 mg  4 mg Intravenous Q6H PRN Jerrell Belfast, MD      . traMADol Veatrice Bourbon) tablet 50 mg  50 mg Oral Q6H PRN Jerrell Belfast, MD   50 mg at 09/13/19 1642  . vitamin B-12 (CYANOCOBALAMIN) tablet 1,000 mcg  1,000 mcg Oral Daily Jerrell Belfast, MD   1,000 mcg at 09/14/19 1035     Discharge Medications: Please see discharge summary for a list of discharge medications.  Relevant Imaging Results:  Relevant Lab Results:   Additional Information SSN [id number]  Lia Hopping, LCSW

## 2019-09-14 NOTE — Progress Notes (Signed)
Patient suctioned PRN x2. Respiratory therapist called to evaluate patient.

## 2019-09-14 NOTE — Progress Notes (Signed)
PROGRESS NOTE  Clyda HurdleJohn F Flaim ZOX:[id number] DOB: 11/11/1934 DOA: 09/02/2019 PCP: Patient, No Pcp Per  Brief summary:  History of dementia from memory care admitted with neck abscess-drained by ENT.   Hospital course complicated by delirium, aspiration pneumonia, leukocytosis, elevated lft. QTC prolongation Monitor lft, monitor Qtc awaiting for snf  Placement  Subjective: Patient seen and examined.  When I initially tried to see patient, patient was being seen by PT and I saw him walking using a walker.  Once he finished with PT, he became very lethargic and was sleepy.  He did not have any complaint.   Assessment/Plan: Principal Problem:   Neck abscess Active Problems:   Dementia (HCC)   HTN (hypertension)   Leucocytosis   Pressure injury of skin   Hypokalemia   Normocytic anemia   Acute respiratory failure with hypoxia (HCC)   Hypophosphatemia  Left neck abscess: -ENT consulted, patient underwent I&D in OR on November 20th, wound culture growing MRSA -He has been on  IV vancomycin for 6 days post op, neck wound healing well, transitioned  to doxycycline on 11/26 for another 4 days, last dose November 29.  Patient has remained afebrile.  Persistent leukocytosis: -neck wound healing well, does not appear to have worsening of existing infection or developing new infection.  He has remained afebrile. -abx de-escalated on 11/26, wbc start to trenddown , lactic acid unremarkable on 09/11/2019. -continue Monitor signs of infection -continue to monitor , outpatient hematology follow-up recommended   LFT elevation: Ultrasound abdomen negative for any acute findings.  Has history of cholecystectomy.  He is not on any medications which can cause liver toxicity.  Viral hepatitis panel negative as well.  LFTs improving every day.  Acute hypoxic respiratory failure secondary to aspiration pneumonitis tracheitis, hypoxia has resolved -Received Rocephin and Flagyl for 4 days. -Diet  recommendation per speech as below  electrolyte abnormalities including hypokalemia, hyponatremia, hypophosphatemia Normalized, continue monitor.  QTC prolongation -Monitor electrolyte -QTC has improved after stopping Seroquel, decreased from 501 to 490.  Left subclavian stenosis: CT neck which was obtained to rule out Lemierre syndrome did not show any jugular vein thrombosis but revealed critical left subclavian stenosis 75 to 90% per radiology report.    By hospitalist colleague Dr. Lajuana RippleKamineni called and discussed with vascular surgery Dr. Edilia Boickson who recommended conservative management with aspirin.  Per vascular surgery, can consider obtaining carotid duplex to assess for subclavian steal syndrome/reversal of carotid blood flow if patient complains of dizziness (unlikely to ever know in this patient with advanced dementia).  Avoid blood pressure checks in left arm as can be falsely low.  Lipid profile hdl 31, ldl 73  Delirium with history of dementia -He did not respond to Haldol or trazodone per previous report -he is currently on low-dose Zyprexa starting 09/10/2019.  patient appeared to tolerate Zyprexa so far, safety sitter as needed -We will avoid benzo.  He was alert and oriented this morning.  Stage I pressure ulcer right buttock presented on admission  H/ounilateral  inguinal hernia (right), stable, no pain  Failure to thrive,  He is from memory care unit,  physical therapy recommend snf or return to memory care if they can arrange PT  DVT Prophylaxis: subQ lovenox  Code Status: DNR  Family Communication: patient   Disposition Plan: SNF Social worker consulted, awaiting for bed  Consultants:  ENT  Phone conversation with Vascular surgery  Procedures:  Left neck abscess I&D in OR by ENT  Antibiotics:  As  above  Objective: BP (!) 170/86   Pulse 89   Temp 98.9 F (37.2 C)   Resp 17   Ht 5\' 6"  (1.676 m)   Wt 55.9 kg   SpO2 (!) 83%   BMI 19.89 kg/m    Intake/Output Summary (Last 24 hours) at 09/14/2019 1210 Last data filed at 09/14/2019 1000 Gross per 24 hour  Intake 120 ml  Output 1100 ml  Net -980 ml   Filed Weights   09/03/19 0528 09/14/19 0513  Weight: 58.9 kg 55.9 kg    Exam: General exam: Appears calm and comfortable  Respiratory system: Clear to auscultation. Respiratory effort normal. Cardiovascular system: S1 & S2 heard, RRR. No JVD, murmurs, rubs, gallops or clicks. No pedal edema. Gastrointestinal system: Abdomen is nondistended, soft and nontender. No organomegaly or masses felt. Normal bowel sounds heard. Central nervous system: Alert and oriented. No focal neurological deficits. Extremities: Symmetric 5 x 5 power. Skin: No rashes, lesions or ulcers.  Psychiatry: Judgement and insight appear poor, mood & affect appropriate.    Data Reviewed: Basic Metabolic Panel: Recent Labs  Lab 09/08/19 0358  09/10/19 0257 09/11/19 0307 09/12/19 0255 09/13/19 0338 09/14/19 0402  NA 142   < > 137 139 140 141 142  K 3.5   < > 3.4* 4.4 3.5 4.0 4.0  CL 105   < > 104 106 104 105 104  CO2 25   < > 26 25 25 26 27   GLUCOSE 86   < > 86 89 102* 94 95  BUN 17   < > 13 12 14 21 19   CREATININE 0.83   < > 0.89 0.87 1.04 1.07 1.05  CALCIUM 8.4*   < > 7.8* 8.4* 8.4* 8.8* 9.0  MG 2.2  --  2.0  --   --  2.0  --   PHOS 3.0  --  2.5  --   --   --   --    < > = values in this interval not displayed.   Liver Function Tests: Recent Labs  Lab 09/10/19 0257 09/11/19 0307 09/12/19 0255 09/13/19 0338 09/14/19 0402  AST 71* 123* 121* 177* 134*  ALT 38 59* 68* 91* 91*  ALKPHOS 76 93 81 85 87  BILITOT 0.7 0.6 1.1 0.7 1.1  PROT 6.1* 6.9 6.7 7.1 8.0  ALBUMIN 2.7* 3.0* 3.0* 3.1* 3.6   No results for input(s): LIPASE, AMYLASE in the last 168 hours. Recent Labs  Lab 09/13/19 0338  AMMONIA 22   CBC: Recent Labs  Lab 09/08/19 0358  09/10/19 0257 09/11/19 0307 09/12/19 0255 09/13/19 0338 09/14/19 0402  WBC 16.4*   < > 21.1*  18.0* 15.8* 14.5* 17.5*  NEUTROABS 8.8*  --   --  8.9* 8.6* 8.1* 10.5*  HGB 13.3   < > 12.3* 14.0 13.8 14.9 14.7  HCT 41.8   < > 38.8* 44.5 42.7 47.2 45.8  MCV 96.5   < > 96.3 96.9 96.0 96.5 96.2  PLT 325   < > 319 392 393 435* 463*   < > = values in this interval not displayed.   Cardiac Enzymes:   Recent Labs  Lab 09/13/19 0338  CKTOTAL 747*   BNP (last 3 results) Recent Labs    09/13/19 0903  BNP 80.9    ProBNP (last 3 results) No results for input(s): PROBNP in the last 8760 hours.  CBG: No results for input(s): GLUCAP in the last 168 hours.  No results found for this  or any previous visit (from the past 240 hour(s)).   Studies: Dg Chest Port 1 View  Result Date: 09/14/2019 CLINICAL DATA:  Shortness of breath. EXAM: PORTABLE CHEST 1 VIEW COMPARISON:  09/08/2019.  CT 09/06/2019. FINDINGS: Mediastinum and hilar structures normal. Heart size normal. Mild bibasilar subsegmental atelectasis. Minimal residual bibasilar infiltrates cannot be excluded. No pleural effusion or pneumothorax. Degenerative change thoracic spine. IMPRESSION: Mild bibasilar subsegmental atelectasis. Minimal residual bibasilar infiltrates cannot be excluded. Electronically Signed   By: Maisie Fus  Register   On: 09/14/2019 05:53    Scheduled Meds: . acetaminophen  1,000 mg Oral Once  . aspirin EC  81 mg Oral Daily  . cholecalciferol  2,000 Units Oral Daily  . enoxaparin (LOVENOX) injection  40 mg Subcutaneous QHS  . furosemide  40 mg Intravenous Once  . iron polysaccharides  150 mg Oral Q lunch  . lidocaine  1 patch Transdermal Q24H  . OLANZapine  2.5 mg Oral QHS  . vitamin B-12  1,000 mcg Oral Daily    Continuous Infusions:    Time spent: 30 minutes I have personally reviewed and interpreted on  09/14/2019 daily labs,  imagings as discussed above under date review session and assessment and plans.  I reviewed all nursing notes, pharmacy notes, consultant notes,  vitals, pertinent old records     Hughie Closs MD,  [facility]  If 7PM-7AM, please contact night-coverage at [email or web address], password [facility] 09/14/2019, 12:10 PM  LOS: 12 days

## 2019-09-14 NOTE — Progress Notes (Signed)
  Speech Language Pathology Treatment: Dysphagia  Patient Details Name: Kristopher Evans MRN: [id number] DOB: 1935-03-20 Today's Date: 09/14/2019 Time: 8676-7209 SLP Time Calculation (min) (ACUTE ONLY): 29 min  Assessment / Plan / Recommendation Clinical Impression  Pt seen at bedside for skilled ST intervention focused on goals for diet tolerance and adherence to safe swallow precautions. Pt was very sleepy today, but would rouse for removal of upper denture and oral care. Dentures were left in denture cup in water, beside the sink. Pt declined presentation of thin liquid, and accepted only 3 small boluses of magic cup. Pt exhibits intermittent throat clearing and congested breathing sounds.  Consulted with RN. Recommend downgrading diet to puree (dysphagia1) diet with thin liquids via cup, crushed meds, due to pt highly variable mentation. SLP will continue to follow acutely for diet tolerance assessment and education. RN and MD informed of recommendations.   HPI HPI: 84 yo male adm to [facility] with left neck abscess.  He has h/o dementia, and is s/p I &D in OR with ENT on 09/04/2019.  Per RN, pt tolerated po well until the weekend when he likely aspirated pot roast.  Last night SLP attempted visit but pt was lehtargic.  11/24 he will fully alert.  Pt CXR showed bilateral LL consolidation - debris in mainstem bronchus and trachea secondary to aspiration.  Per imaging report - on 11/18, pt's pharynx and larynx were not swollen and airway was patent.      SLP Plan  Continue with current plan of care       Recommendations  Diet recommendations: Dysphagia 2 (fine chop);Thin liquid Liquids provided via: Cup;No straw Medication Administration: Whole meds with puree Supervision: Staff to assist with self feeding;Full supervision/cueing for compensatory strategies Compensations: Slow rate;Small sips/bites;Follow solids with liquid;Minimize environmental distractions Postural Changes and/or Swallow  Maneuvers: Seated upright 90 degrees;Upright 30-60 min after meal                Oral Care Recommendations: Oral care QID Follow up Recommendations: 24 hour supervision/assistance SLP Visit Diagnosis: Dysphagia, oropharyngeal phase (R13.12) Plan: Continue with current plan of care       [place], [facility] [place] Pathologist Office: [phone] Pager: [phone]  Shonna Chock 09/14/2019, 12:15 PM

## 2019-09-15 DIAGNOSIS — A4902 Methicillin resistant Staphylococcus aureus infection, unspecified site: Secondary | ICD-10-CM | POA: Diagnosis not present

## 2019-09-15 DIAGNOSIS — R1310 Dysphagia, unspecified: Secondary | ICD-10-CM | POA: Diagnosis not present

## 2019-09-15 DIAGNOSIS — G4701 Insomnia due to medical condition: Secondary | ICD-10-CM | POA: Diagnosis not present

## 2019-09-15 DIAGNOSIS — M6281 Muscle weakness (generalized): Secondary | ICD-10-CM | POA: Diagnosis not present

## 2019-09-15 DIAGNOSIS — Z7401 Bed confinement status: Secondary | ICD-10-CM | POA: Diagnosis not present

## 2019-09-15 DIAGNOSIS — F0391 Unspecified dementia with behavioral disturbance: Secondary | ICD-10-CM | POA: Diagnosis not present

## 2019-09-15 DIAGNOSIS — F0390 Unspecified dementia without behavioral disturbance: Secondary | ICD-10-CM | POA: Diagnosis not present

## 2019-09-15 DIAGNOSIS — G4700 Insomnia, unspecified: Secondary | ICD-10-CM | POA: Diagnosis not present

## 2019-09-15 DIAGNOSIS — M2550 Pain in unspecified joint: Secondary | ICD-10-CM | POA: Diagnosis not present

## 2019-09-15 DIAGNOSIS — R1312 Dysphagia, oropharyngeal phase: Secondary | ICD-10-CM | POA: Diagnosis not present

## 2019-09-15 DIAGNOSIS — D649 Anemia, unspecified: Secondary | ICD-10-CM | POA: Diagnosis not present

## 2019-09-15 DIAGNOSIS — I10 Essential (primary) hypertension: Secondary | ICD-10-CM | POA: Diagnosis not present

## 2019-09-15 DIAGNOSIS — R5381 Other malaise: Secondary | ICD-10-CM | POA: Diagnosis not present

## 2019-09-15 DIAGNOSIS — Z751 Person awaiting admission to adequate facility elsewhere: Secondary | ICD-10-CM | POA: Diagnosis not present

## 2019-09-15 DIAGNOSIS — L0211 Cutaneous abscess of neck: Secondary | ICD-10-CM | POA: Diagnosis not present

## 2019-09-15 LAB — COMPREHENSIVE METABOLIC PANEL
ALT: 71 U/L — ABNORMAL HIGH (ref 0–44)
AST: 78 U/L — ABNORMAL HIGH (ref 15–41)
Albumin: 3 g/dL — ABNORMAL LOW (ref 3.5–5.0)
Alkaline Phosphatase: 81 U/L (ref 38–126)
Anion gap: 10 (ref 5–15)
BUN: 21 mg/dL (ref 8–23)
CO2: 25 mmol/L (ref 22–32)
Calcium: 8.9 mg/dL (ref 8.9–10.3)
Chloride: 106 mmol/L (ref 98–111)
Creatinine, Ser: 0.97 mg/dL (ref 0.61–1.24)
GFR calc Af Amer: >60 mL/min (ref >60)
GFR calc non Af Amer: >60 mL/min (ref >60)
Glucose, Bld: 104 mg/dL — ABNORMAL HIGH (ref 70–99)
Potassium: 3.6 mmol/L (ref 3.5–5.1)
Sodium: 141 mmol/L (ref 135–145)
Total Bilirubin: 0.6 mg/dL (ref 0.3–1.2)
Total Protein: 7 g/dL (ref 6.5–8.1)

## 2019-09-15 LAB — CBC WITH DIFFERENTIAL/PLATELET
Abs Immature Granulocytes: 0.15 10*3/uL — ABNORMAL HIGH (ref 0.00–0.07)
Basophils Absolute: 0.1 10*3/uL (ref 0.0–0.1)
Basophils Relative: 1 %
Eosinophils Absolute: 0.2 10*3/uL (ref 0.0–0.5)
Eosinophils Relative: 1 %
HCT: 44.1 % (ref 39.0–52.0)
Hemoglobin: 13.9 g/dL (ref 13.0–17.0)
Immature Granulocytes: 1 %
Lymphocytes Relative: 33 %
Lymphs Abs: 5.3 10*3/uL — ABNORMAL HIGH (ref 0.7–4.0)
MCH: 30.4 pg (ref 26.0–34.0)
MCHC: 31.5 g/dL (ref 30.0–36.0)
MCV: 96.5 fL (ref 80.0–100.0)
Monocytes Absolute: 1.1 10*3/uL — ABNORMAL HIGH (ref 0.1–1.0)
Monocytes Relative: 7 %
Neutro Abs: 9 10*3/uL — ABNORMAL HIGH (ref 1.7–7.7)
Neutrophils Relative %: 57 %
Platelets: 370 10*3/uL (ref 150–400)
RBC: 4.57 MIL/uL (ref 4.22–5.81)
RDW: 14.7 % (ref 11.5–15.5)
WBC: 16 10*3/uL — ABNORMAL HIGH (ref 4.0–10.5)
nRBC: 0 % (ref 0.0–0.2)

## 2019-09-15 MED ORDER — TERBINAFINE HCL 1 % EX CREA: TOPICAL_CREAM | Freq: Two times a day (BID) | CUTANEOUS | 0 refills | Status: AC

## 2019-09-15 NOTE — Discharge Instructions (Signed)
MRSA Infection, Diagnosis, Adult °Methicillin-resistant Staphylococcus aureus (MRSA) infection is caused by bacteria called Staphylococcus aureus, or staph, that no longer respond to common antibiotic medicines (drug-resistant bacteria). MRSA infection can be hard to treat. °Most of the time, MRSA can be on the skin or in the nose without causing problems (colonized). However, if MRSA enters the body through a cut, a sore, or an invasive medical device, it can cause a serious infection. °What are the causes? °This condition is caused by staph bacteria. Illness may develop after exposure to the bacteria through: °· Skin-to-skin contact with someone who is infected with MRSA. °· Touching surfaces that have the bacteria on them. °· Having a procedure or using equipment that allows MRSA to enter the body. °· Having MRSA that lives on your skin and then enters your body through: °? A cut or scratch. °? A surgery or procedure. °? The use of a medical device. °Contact with the bacteria may occur: °· During a stay in a hospital, rehabilitation facility, nursing home, or other health care facility (health care-associated MRSA). °· In daily activities where there is close contact with others, such as sports, child care centers, or at home (community-associated MRSA). °What increases the risk? °You are more likely to develop this condition if you: °· Have a surgery or procedure. °· Have an IV or a thin tube (catheter) placed in your body. °· Are elderly. °· Are on kidney dialysis. °· Have recently taken an antibiotic medicine. °· Live in a long-term care facility. °· Have a chronic wound or skin ulcer. °· Have a weak body defense system (immune system). °· Play sports that involve skin-to-skin contact. °· Live in a crowded place, like a dormitory or military barracks. °· Share towels, razors, or sports equipment with other people. °· Have a history of MRSA infection or colonization. °What are the signs or symptoms? °Symptoms  of this condition depend on the area that is affected. Symptoms may include: °· A pus-filled pimple or boil. °· Pus that drains from your skin. °· A sore (abscess) under your skin or somewhere in your body. °· Fever with or without chills. °· Difficulty breathing. °· Coughing up blood. °· Redness, warmth, swelling, or pain in the affected area. °How is this diagnosed? °This condition may be diagnosed based on: °· A physical exam. °· Your medical history. °· Taking a sample from the infected area and growing it in a lab (culture). °You may also have other tests, including: °· Imaging tests, such as X-rays, a CT scan, or an MRI. °· Lab tests, such as blood, urine, or phlegm (sputum) tests. °You skin or nose may be swabbed when you are admitted to a health care facility for a procedure. This is to screen for MRSA. °How is this treated? °Treatment depends on the type of MRSA infection you have and how severe, deep, or extensive it is. Treatment may include: °· Antibiotic medicines. °· Surgery to drain pus from the infected area. °Severe infections may require a hospital stay. °Follow these instructions at home: °Medicines °· Take over-the-counter and prescription medicines only as told by your health care provider. °· If you were prescribed an antibiotic medicine, use it as told by your health care provider. Do not stop using the antibiotic even if you start to feel better. °Prevention °Follow these instructions to avoid spreading the infection to others: °· Wash your hands frequently with soap and water. If soap and water are not available, use an alcohol-based hand sanitizer. °·   Avoid close contact with those around you as much as possible. Do not use towels, razors, toothbrushes, bedding, or other items that will be used by others. °· Wash towels, bedding, and clothes in the washing machine with detergent and hot water. Dry them in a hot dryer. °· Clean surfaces regularly to remove germs (disinfection). Use products  or solutions that contain bleach. Make sure you disinfect bathroom surfaces, food preparation areas, exercise equipment, and doorknobs. ° °General instructions °· If you have a wound, follow instructions from your health care provider about how to take care of your wound. °? Do not pick at scabs. °? Do not try to drain any infection sites or pimples. °· Tell all your health care providers that you have MRSA, or if you have ever had a MRSA infection. °· Keep all follow-up visits as told by your health care provider. This is important. °Contact a health care provider if you: °· Do not get better. °· Have symptoms that get worse. °· Have new symptoms. °Get help right away if you have: °· Nausea or vomiting, or if you cannot take medicine without vomiting. °· Trouble breathing. °· Chest pain. °These symptoms may represent a serious problem that is an emergency. Do not wait to see if the symptoms will go away. Get medical help right away. Call your local emergency services (911 in the [place] Do not drive yourself to the hospital. °Summary °· MRSA infection is caused by bacteria called Staphylococcus aureus, or staph, that no longer respond to common antibiotic medicines. °· Treatment for this condition depends on the type of MRSA infection you have and how severe, deep, and extensive it is. °· If you were prescribed an antibiotic medicine, use it as told by your health care provider. Do not stop using the antibiotic even if you start to feel better. °· Follow instructions from your health care provider to avoid spreading the infection to others. °This information is not intended to replace advice given to you by your health care provider. Make sure you discuss any questions you have with your health care provider. °Document Released: 10/01/2005 Document Revised: 12/18/2018 Document Reviewed: 12/19/2018 °[employer] Patient Education © 2020 [employer] ° °

## 2019-09-15 NOTE — Discharge Summary (Signed)
Physician Discharge Summary  Kristopher Evans UVO:[id number] DOB: [birth year] DOA: 09/02/2019  PCP: Patient, No Pcp Per  Admit date: 09/02/2019 Discharge date: 09/15/2019  Admitted From: Skilled nursing facility Disposition: Skilled nursing facility  Recommendations for Outpatient Follow-up:  1. Follow up with PCP in 1-2 weeks 2. Please obtain BMP/CBC in one week 3. Please follow up on the following pending results:  Home Health: None Equipment/Devices: None  Discharge Condition: Stable CODE STATUS: DNR Diet recommendation: Dysphagia 1 diet  Subjective: Seen and examined.  Slightly lethargic but pleasant.  Brief/Interim Summary: 84 year old male with history of dementia, presented from SNF on 11/18 with fever and left posterior auricular/neck swelling x 3 days. On presentation, temp 99.2, blood pressure 196/84, pulse 108 and respiratory rate 18 with O2 sat 98% on room air.  White count was 18 point 7K.  CT neck showed 3 cm left neck abscess and surrounding cellulitis with mildly enlarged lymph nodes. I&D attempted in the ER and admitted to [facility] service with IV antibiotics-vancomycin/cefepime.   Hospital course: During the hospital stay patient's antibiotics were titrated per culture results.  Wound cultures grew moderate MRSA - sensitive to clindamycin/Bactrim and tetracycline as well.  Given persistent induration and purulent drainage with rising white count (peaked to 20K), ENT was formally consulted and patient underwent I&D in the OR on 11/20.  Patient was continued on vancomycin IV.  Hospital course has been complicated by delirium requiring soft mittens/Haldol, mild hyponatremia, hypokalemia acute hypoxic respiratory failure on 11/22 requiring 2 L of O2 presumed due to fluid overload.  IV fluids were discontinued and patient received IV Lasix.  Chest x-ray reported perihilar infiltrates likely reflecting aspiration pneumonia.  Speech therapy was consulted to rule out aspiration.  After  discussing with ID, prior rounding hospitalist ordered CTA of the chest/CTA of the neck were ordered to rule out Lemierre's syndrome.  CTA chest 11/22 was negative for PE but showed bilateral lower lobe consolidations along with debris in trachea/right mainstem bronchus suggesting aspiration as well as small pleural effusions.  CTA neck 11/22 showed improvement in neck abscess and reduced carotid flow with high-grade left subclavian stenosis suspected to be 75 to 90%.  Patient received total 6 days of IV vancomycin followed by 4 days of oral doxycycline and completed course of antibiotics on 09/13/2019.  Patient has remained afebrile however he continues to have leukocytosis with no bleeding source of infection.  He was also seen by ENT few days ago and his wound on the neck looks good and ENT did not have any concern for reinfection or any new developing or persistent abscess.  Patient has been on dysphagia 1 diet by ENT due to aspiration risk and dysphagia.  I assumed patient's care starting yesterday.  On examination, I noted tinea corporis around previous PEG tube site.  Ordered Lamisil cream for him.  He will continue that as outpatient.  Patient has been on room air for several days.  He has remained afebrile.  Hemodynamically stable.  Seen by PT OT.  Arrangements made for him to return to skilled nursing facility today.    Discharge Diagnoses:  Principal Problem:   Neck abscess Active Problems:   Dementia (HCC)   HTN (hypertension)   Leucocytosis   Pressure injury of skin   Hypokalemia   Normocytic anemia   Acute respiratory failure with hypoxia (HCC)   Hypophosphatemia    Discharge Instructions  Discharge Instructions    Discharge patient   Complete by: As directed  Discharge disposition: 03-Skilled Nursing Facility   Discharge patient date: 09/15/2019     Allergies as of 09/15/2019      Reactions   Penicillins    Not listed on MAR      Medication List    TAKE these  medications   aspirin EC 81 MG tablet Take 81 mg by mouth daily.   liver oil-zinc oxide 40 % ointment Commonly known as: DESITIN Apply 1 application topically as needed for irritation.   moxifloxacin 0.5 % ophthalmic solution Commonly known as: Vigamox 1gtt to eyes, bilaterally, q2hours while awake for 2 days, THEN q6hrs for 5 days.   terbinafine 1 % cream Commonly known as: LAMISIL Apply topically 2 (two) times daily for 10 days. Apply to paramedical area around previous PEG tube insertion site   traZODone 50 MG tablet Commonly known as: DESYREL Take 50 mg by mouth at bedtime.   triamcinolone cream 0.1 % Commonly known as: KENALOG Apply 1 application topically 3 (three) times daily.   vitamin B-12 1000 MCG tablet Commonly known as: CYANOCOBALAMIN Take 1,000 mcg by mouth daily.   Vitamin D 50 MCG (2000 UT) Caps Take 2,000 Units by mouth daily.      Follow-up Information    snf MD to arrange hematology referral for chronic leukocytosis/lymphocytosis Follow up.          Allergies  Allergen Reactions  . Penicillins     Not listed on [facility]    Consultations: ENT and ID over the phone   Procedures/Studies: Ct Soft Tissue Neck W Contrast  Result Date: 09/02/2019 CLINICAL DATA:  Abscess behind the left ear with drainage. Left lateral neck swelling. EXAM: CT NECK WITH CONTRAST TECHNIQUE: Multidetector CT imaging of the neck was performed using the standard protocol following the bolus administration of intravenous contrast. CONTRAST:  75mL OMNIPAQUE IOHEXOL 300 MG/ML  SOLN COMPARISON:  None. FINDINGS: Pharynx and larynx: No evidence of mass or swelling within limitations of motion artifact. Patent airway. No retropharyngeal fluid collection. Salivary glands: No inflammation, mass, or stone. Thyroid: Unremarkable. Lymph nodes: Mildly enlarged left supraclavicular and level V lymph nodes measuring 9-11 mm in short axis, likely reactive. Vascular: Aortic and mild carotid artery  atherosclerosis. Limited intracranial: Unremarkable. Visualized orbits: Bilateral cataract extraction. Mastoids and visualized paranasal sinuses: Clear. Skeleton: Advanced disc space narrowing at C5-6, C6-7, and C7-T1. Severe neural foraminal stenosis due to uncovertebral spurring on the right at C5-6 and on the left at C6-7. Upper chest: Clear lung apices. Other: 2.5 x 2.7 x 3.0 cm rim enhancing fluid collection in the left posterolateral neck centered in the subcutaneous/cutaneous tissues with overlying skin bulging, skin thickening, and subcutaneous inflammation. Inflammation extends into the underlying left posterior neck musculature without evidence of a separate deep cervical abscess. IMPRESSION: 1. 3 cm left neck abscess and surrounding cellulitis. 2. Mildly enlarged left neck lymph nodes, likely reactive. 3. Aortic Atherosclerosis (ICD10-I70.0). Electronically Signed   By: Sebastian Ache M.D.   On: 09/02/2019 19:22   Ct Angio Neck W Or Wo Contrast  Result Date: 09/06/2019 CLINICAL DATA:  Confusion. Continued difficulty after incision and drainage of LEFT neck abscess. EXAM: CT ANGIOGRAPHY NECK TECHNIQUE: Multidetector CT imaging of the neck was performed using the standard protocol during bolus administration of intravenous contrast. Multiplanar CT image reconstructions and MIPs were obtained to evaluate the vascular anatomy. Carotid stenosis measurements (when applicable) are obtained utilizing NASCET criteria, using the distal internal carotid diameter as the denominator. CONTRAST:  OMNIPAQUE IOHEXOL 350  MG/ML SOLN COMPARISON:  CT neck 09/02/2019. FINDINGS: Due to patient motion, CT angiography of the neck for assessment and quantification of stenosis is limited. Aortic arch: Extensive atheromatous change. High-grade stenosis of the proximal LEFT subclavian estimated 75-90% due to calcific and soft plaque. Three-vessel branching pattern. Right carotid system: Calcific and soft plaque in the  proximal RIGHT internal carotid artery, estimated 50% stenosis. No visible dissection. Left carotid system: Calcific and soft plaque in the proximal LEFT internal carotid artery, estimated 50% or less stenosis. No visible dissection. Vertebral arteries: BILATERAL patent, RIGHT dominant. Skeleton: Spondylosis. Other neck: Improved appearance of the LEFT neck abscess following I and D. approximate cross-section 1.2 x 1.3 cm. Surrounding cellulitic change also improved. Upper chest: Reported separately. IMPRESSION: Motion degradation limits assessment for CT angiographic findings. Improved appearance of the LEFT neck abscess following surgical drainage, now just slightly over 1 cm in cross-section, all with also improved cellulitic change. Atheromatous change at the carotid bifurcations, without definite flow reducing lesion. Suspected high-grade stenosis LEFT subclavian, 75-90% estimated. Electronically Signed   By: Elsie Stain M.D.   On: 09/06/2019 19:34   Dg Chest Port 1 View  Result Date: 09/14/2019 CLINICAL DATA:  Shortness of breath. EXAM: PORTABLE CHEST 1 VIEW COMPARISON:  09/08/2019.  CT 09/06/2019. FINDINGS: Mediastinum and hilar structures normal. Heart size normal. Mild bibasilar subsegmental atelectasis. Minimal residual bibasilar infiltrates cannot be excluded. No pleural effusion or pneumothorax. Degenerative change thoracic spine. IMPRESSION: Mild bibasilar subsegmental atelectasis. Minimal residual bibasilar infiltrates cannot be excluded. Electronically Signed   By: Maisie Fus  Register   On: 09/14/2019 05:53   Dg Chest Port 1 View  Result Date: 09/08/2019 CLINICAL DATA:  Shortness of breath EXAM: PORTABLE CHEST 1 VIEW COMPARISON:  Yesterday FINDINGS: Normal heart size and mediastinal contours. Increased perihilar markings is stable to improved and correlates with airspace/atelectasis disease by chest CT. Stable exam. IMPRESSION: Borderline improvement in perihilar airspace disease.  Electronically Signed   By: Marnee Spring M.D.   On: 09/08/2019 07:32   Dg Chest Port 1 View  Result Date: 09/07/2019 CLINICAL DATA:  Shortness of breath EXAM: PORTABLE CHEST 1 VIEW COMPARISON:  CTA chest 09/06/2019 FINDINGS: Interstitial and airspace opacity predominantly in the right mid to infrahilar lung. No pneumothorax or visible effusion. The aorta is calcified. The remaining cardiomediastinal contours are unremarkable. No acute osseous or soft tissue abnormality. IMPRESSION: Interstitial and airspace opacity predominantly in the infrahilar lung suspicious for pneumonia or sequela of aspiration. Better seen on CT of the chest performed 09/06/2019. Electronically Signed   By: Kreg Shropshire M.D.   On: 09/07/2019 05:54   Dg Chest Port 1 View  Result Date: 09/06/2019 CLINICAL DATA:  Shortness of breath EXAM: PORTABLE CHEST 1 VIEW COMPARISON:  Yesterday FINDINGS: Continued perihilar infiltrates. No Kerley lines, effusion, or pneumothorax. Normal heart size and stable aortic tortuosity. IMPRESSION: Stable perihilar infiltrates likely reflecting pneumonia or aspiration. Electronically Signed   By: Marnee Spring M.D.   On: 09/06/2019 07:09   Dg Chest Port 1 View  Result Date: 09/05/2019 CLINICAL DATA:  84 year old male with shortness of breath and rhonchi on physical exam EXAM: PORTABLE CHEST 1 VIEW COMPARISON:  Prior chest x-ray 12/15/2016 FINDINGS: Rounded but ill-defined airspace opacity in the right lung base which may represent infiltrate or mass. The cardiac and mediastinal contours are within normal limits. There may be slight widening of the right paratracheal soft tissue stripe. Atherosclerotic calcifications are visualized in the thoracic aorta. No pulmonary edema, pleural effusion or  pneumothorax. No acute osseous abnormality. IMPRESSION: Rounded airspace opacity in the right lung base which may reflect pneumonia or mass. Recommend dedicated PA and lateral chest x-ray for further  evaluation. Aortic Atherosclerosis (ICD10-170.0) Electronically Signed   By: Jacqulynn Cadet M.D.   On: 09/05/2019 13:15   Dg Swallowing Func-speech Pathology  Result Date: 09/08/2019 Objective Swallowing Evaluation: Type of Study: MBS-Modified Barium Swallow Study  Patient Details Name: COSTANTINO KOHLBECK MRN: [id number] Date of Birth: 1934/10/16 Today's Date: 09/08/2019 Time: SLP Start Time (ACUTE ONLY): 1215 -SLP Stop Time (ACUTE ONLY): 1245 SLP Time Calculation (min) (ACUTE ONLY): 30 min Past Medical History: Past Medical History: Diagnosis Date . Dementia [facility])  Past Surgical History: Past Surgical History: Procedure Laterality Date . MINOR IRRIGATION AND DEBRIDEMENT OF WOUND Left 09/04/2019  Procedure: IRRIGATION AND DEBRIDEMENT OF NECK ABCESS;  Surgeon: Jerrell Belfast, MD;  Location: [facility] ORS;  Service: ENT;  Laterality: Left; HPI: 84 yo male adm to [facility] with left neck abscess.  He has h/o dementia, and is s/p I &D in OR with ENT on 09/04/2019.  Per RN, pt tolerated po well until the weekend when he likely aspirated pot roast.  Last night SLP attempted visit but pt was lehtargic.  11/24 he will fully alert.  Pt CXR showed bilateral LL consolidation - debris in mainstem bronchus and trachea secondary to aspiration.  Per imaging report - on 11/18, pt's pharynx and larynx were not swollen and airway was parent.  Subjective: pt awake in chair Patient presents with mild-moderate oropharyngeal dysphagia with decreased mastication ability of hard boluses *eg poor mastication of barium tablet when he attempted* suspect illl fitting dentures impacting function.  He demonstrated functional oral transiting with liquids, soft solids and purees.  Pharyngeal swallow c/b sensorimotor deficits with decreased tongue base retraction,pharyngeal constriction resulting in vallecular residuals across all boluses without awareness.  Postural changes - head turn right/left, chin tuck were not helpful to prevent.  Cued STRONG swallow  was helpful with solids as well as drinking liquids after swallow.  Pt did cough and expectorate bolus residuals from vallecular region with max cues and advise he continue this strategy.  Two episodes of mild aspiration with thin liquids due to decreased timing of laryngeal closure with delayed cough response - bolus was too far into trachea to clear.  Pt will benefit from full supervision as he takes very LARGE boluses despite verbal/visual cues to small boluses.   Upon esophgeal sweep, pt appears with residuals without awareness.  Recommend very strict aspiration/esophageal precautions.   Dys2/thin diet recommended at this time to mitigate aspiration risk.  SLP will follow up for education, compensation strategies and strengthening.  Thanks for this consult.   Of note, pt did NOT cough during MBS when he did not aspirate - cough x2 only with thin aspiration.  Swallow Evaluation Recommendations   Recommended Consults: Consider esophageal assessment   SLP Diet Recommendations: Dysphagia 2 (Fine chop) solids;Thin liquid   Liquid Administration via: Cup;No straw(NO STRAW)      Supervision: Full assist for feeding;Full supervision/cueing for compensatory strategies   Compensations: Slow rate;Small sips/bites;Follow solids with liquid(EFFORTFUL SWALLOW)  Cough/"Hock" to clear pharyngeal retention.   Postural Changes: Remain semi-upright after after feeds/meals (Comment);Seated upright at 90 degrees   Oral Care Recommendations: Oral care QID Assessment / Plan / Recommendation CHL IP CLINICAL IMPRESSIONS 09/08/2019 Clinical Impression  SLP Visit Diagnosis Dysphagia, oropharyngeal phase (R13.12) Attention and concentration deficit following -- Frontal lobe and executive function deficit following --  Impact on safety and function Moderate aspiration risk   CHL IP TREATMENT RECOMMENDATION 09/08/2019 Treatment Recommendations Therapy as outlined in treatment plan below   Prognosis 09/08/2019 Prognosis for Safe Diet  Advancement Fair Barriers to Reach Goals Cognitive deficits Barriers/Prognosis Comment -- CHL IP DIET RECOMMENDATION 09/08/2019 SLP Diet Recommendations Dysphagia 2 (Fine chop) solids;Thin liquid Liquid Administration via Cup;No straw Medication Administration -- Compensations Slow rate;Small sips/bites;Follow solids with liquid Postural Changes Remain semi-upright after after feeds/meals (Comment);Seated upright at 90 degrees   CHL IP OTHER RECOMMENDATIONS 09/08/2019 Recommended Consults Consider esophageal assessment Oral Care Recommendations Oral care QID Other Recommendations --   No flowsheet data found.  CHL IP FREQUENCY AND DURATION 09/08/2019 Speech Therapy Frequency (ACUTE ONLY) min 2x/week Treatment Duration 2 weeks      CHL IP ORAL PHASE 09/08/2019 Oral Phase Impaired Oral - Pudding Teaspoon -- Oral - Pudding Cup -- Oral - Honey Teaspoon -- Oral - Honey Cup -- Oral - Nectar Teaspoon WFL Oral - Nectar Cup [place];Premature spillage Oral - Nectar Straw WFL;Premature spillage Oral - Thin Teaspoon WFL;Premature spillage Oral - Thin Cup [facility];Premature spillage Oral - Thin Straw WFL;Premature spillage Oral - Puree WFL Oral - Mech Soft WFL Oral - Regular -- Oral - Multi-Consistency -- Oral - Pill Weak lingual manipulation;Other (Comment) Oral Phase - Comment --  CHL IP PHARYNGEAL PHASE 09/08/2019 Pharyngeal Phase Impaired Pharyngeal- Pudding Teaspoon -- Pharyngeal -- Pharyngeal- Pudding Cup -- Pharyngeal -- Pharyngeal- Honey Teaspoon -- Pharyngeal -- Pharyngeal- Honey Cup -- Pharyngeal -- Pharyngeal- Nectar Teaspoon Reduced pharyngeal peristalsis;Reduced tongue base retraction;Pharyngeal residue - valleculae;Reduced epiglottic inversion Pharyngeal Material does not enter airway Pharyngeal- Nectar Cup Reduced pharyngeal peristalsis;Reduced epiglottic inversion;Reduced tongue base retraction;Pharyngeal residue - valleculae Pharyngeal Material does not enter airway Pharyngeal- Nectar Straw Reduced epiglottic  inversion;Reduced pharyngeal peristalsis;Pharyngeal residue - valleculae Pharyngeal Material does not enter airway Pharyngeal- Thin Teaspoon Reduced pharyngeal peristalsis;Reduced epiglottic inversion;Reduced tongue base retraction;Pharyngeal residue - valleculae Pharyngeal Material does not enter airway Pharyngeal- Thin Cup Reduced epiglottic inversion;Reduced laryngeal elevation;Reduced airway/laryngeal closure;Reduced tongue base retraction;Reduced pharyngeal peristalsis Pharyngeal Material does not enter airway Pharyngeal- Thin Straw Reduced pharyngeal peristalsis;Reduced tongue base retraction;Reduced airway/laryngeal closure;Reduced laryngeal elevation;Pharyngeal residue - valleculae;Trace aspiration;Penetration/Aspiration during swallow Pharyngeal Material enters airway, passes BELOW cords without attempt by patient to eject out (silent aspiration);Material enters airway, passes BELOW cords and not ejected out despite cough attempt by patient Pharyngeal- Puree Reduced pharyngeal peristalsis;Reduced tongue base retraction;Pharyngeal residue - valleculae Pharyngeal Material does not enter airway Pharyngeal- Mechanical Soft WFL Pharyngeal Material does not enter airway Pharyngeal- Regular -- Pharyngeal -- Pharyngeal- Multi-consistency -- Pharyngeal Material does not enter airway Pharyngeal- Pill NT Pharyngeal -- Pharyngeal Comment --  CHL IP CERVICAL ESOPHAGEAL PHASE 09/08/2019 Cervical Esophageal Phase Impaired Pudding Teaspoon -- Pudding Cup -- Honey Teaspoon -- Honey Cup -- Nectar Teaspoon -- Nectar Cup -- Nectar Straw -- Thin Teaspoon -- Thin Cup -- Thin Straw -- Puree -- Mechanical Soft -- Regular -- Multi-consistency -- Pill -- Cervical Esophageal Comment Patient appeared with full esophagus of barium without sensation, ? dysmotilty component and ? if possible esophageal deficits may be impacting pharyngeal swallow. Chales Abrahams 09/08/2019, 1:14 PM  Donavan Burnet, MS [facility] SLP Acute Rehab Services  Pager [phone] Office [phone]             Ct Angio Chest Aorta W/cm & Or Wo/cm (aka Dissection)  Result Date: 09/06/2019 CLINICAL DATA:  Shortness of breath. EXAM: CT ANGIOGRAPHY CHEST WITH CONTRAST TECHNIQUE: Multidetector CT imaging of the chest  was performed using the standard protocol during bolus administration of intravenous contrast. Multiplanar CT image reconstructions and MIPs were obtained to evaluate the vascular anatomy. CONTRAST:  OMNIPAQUE IOHEXOL 350 MG/ML SOLN COMPARISON:  None. FINDINGS: Cardiovascular: Evaluation is severely limited by respiratory motion artifact.Given this limitation, there is no large centrally located pulmonary embolism. Detection of smaller pulmonary emboli is not possible on this exam. The main pulmonary artery is within normal limits for size. There is no CT evidence of acute right heart strain. There are atherosclerotic changes of the thoracic aorta. There is no thoracic aortic aneurysm. Heart size is normal, without pericardial effusion. Mediastinum/Nodes: --No mediastinal or hilar lymphadenopathy. --No axillary lymphadenopathy. --No supraclavicular lymphadenopathy. --the thyroid gland is poorly evaluated secondary to extensive motion artifact. --The esophagus is unremarkable Lungs/Pleura: There is consolidation involving the bilateral lower lobes. Debris is noted within the right mainstem bronchus. There are small bilateral pleural effusions. There is no pneumothorax. Debris is noted within the trachea. Upper Abdomen: No acute abnormality. Musculoskeletal: No chest wall abnormality. No acute or significant osseous findings. Evaluation of the lower neck is essentially nondiagnostic on the postcontrast images secondary to extensive motion artifact. Review of the MIP images confirms the above findings. IMPRESSION: 1. Very limited study secondary to extensive motion artifact. 2. No large centrally located pulmonary embolism. Detection of smaller pulmonary  emboli is not possible on this exam. 3. Bilateral lower lobe consolidation concerning for multifocal pneumonia or aspiration. 4. Debris is noted within the trachea and right mainstem bronchus which may be secondary to aspiration. 5. Small bilateral pleural effusions. 6. Essentially nondiagnostic evaluation of the lower neck on the contrast-enhanced series. Aortic Atherosclerosis (ICD10-I70.0). Electronically Signed   By: Katherine Mantle M.D.   On: 09/06/2019 18:19   US Abdomen Limited Ruq  Result Date: 09/11/2019 CLINICAL DATA:  LFT elevation, AMS EXAM: ULTRASOUND ABDOMEN LIMITED RIGHT UPPER QUADRANT COMPARISON:  None. FINDINGS: Gallbladder: Status post cholecystectomy.  Negative sonographic Murphy sign. Common bile duct: Diameter: 7 mm Liver: No focal lesion identified. Mildly increased parenchymal echogenicity. Portal vein is patent on color Doppler imaging with normal direction of blood flow towards the liver. Other: None. IMPRESSION: 1.  Status post cholecystectomy.  No biliary ductal dilatation. 2. Mildly increased hepatic parenchymal echogenicity, in keeping with hepatic steatosis. Electronically Signed   By: Lauralyn Primes M.D.   On: 09/11/2019 14:45      Discharge Exam: Vitals:   09/14/19 2148 09/15/19 0644  BP: 131/62 138/84  Pulse: 70 79  Resp: 18 14  Temp: 98.5 F (36.9 C) 97.9 F (36.6 C)  SpO2: 99% 100%   Vitals:   09/14/19 1412 09/14/19 1414 09/14/19 2148 09/15/19 0644  BP: (!) 152/133  131/62 138/84  Pulse: 76  70 79  Resp: Temp: 98.6 F (37 C)  98.5 F (36.9 C) 97.9 F (36.6 C)  TempSrc: Axillary  Oral Oral  SpO2: (!) 78% 92% 99% 100%  Weight:    54.1 kg  Height:        General: Pt is slightly lethargic but comfortable. Cardiovascular: RRR, S1/S2 +, no rubs, no gallops Respiratory: CTA bilaterally, no wheezing, no rhonchi Abdominal: Soft, NT, ND, bowel sounds + some erythema around previous PEG tube insertion site consistent with tinea  corporis. Extremities: no edema, no cyanosis    The results of significant diagnostics from this hospitalization (including imaging, microbiology, ancillary and laboratory) are listed below for reference.     Microbiology: Recent Results (from  the past 240 hour(s))  SARS CORONAVIRUS 2 (TAT 6-24 HRS) Nasopharyngeal Nasopharyngeal Swab     Status: None   Collection Time: 09/14/19 12:11 PM   Specimen: Nasopharyngeal Swab  Result Value Ref Range Status   SARS Coronavirus 2 NEGATIVE NEGATIVE Final    Comment: (NOTE) SARS-CoV-2 target nucleic acids are NOT DETECTED. The SARS-CoV-2 RNA is generally detectable in upper and lower respiratory specimens during the acute phase of infection. Negative results do not preclude SARS-CoV-2 infection, do not rule out co-infections with other pathogens, and should not be used as the sole basis for treatment or other patient management decisions. Negative results must be combined with clinical observations, patient history, and epidemiological information. The expected result is Negative. Fact Sheet for Patients: HairSlick.no Fact Sheet for Healthcare Providers: quierodirigir.com This test is not yet approved or cleared by the [place] FDA and  has been authorized for detection and/or diagnosis of SARS-CoV-2 by FDA under an Emergency Use Authorization (EUA). This EUA will remain  in effect (meaning this test can be used) for the duration of the COVID-19 declaration under Section 56 4(b)(1) of the Act, 21 U.S.C. section 360bbb-3(b)(1), unless the authorization is terminated or revoked sooner. Performed at [facility], [address] [place], [place] [address]      Labs: BNP (last 3 results) Recent Labs    09/13/19 0903  BNP 80.9   Basic Metabolic Panel: Recent Labs  Lab 09/10/19 0257 09/11/19 0307 09/12/19 0255 09/13/19 0338 09/14/19 0402 09/15/19 0307  NA 137 139  140 141 142 141  K 3.4* 4.4 3.5 4.0 4.0 3.6  CL 104 106 104 105 104 106  CO2 GLUCOSE 86 89 102* 94 95 104*  BUN CREATININE 0.89 0.87 1.04 1.07 1.05 0.97  CALCIUM 7.8* 8.4* 8.4* 8.8* 9.0 8.9  MG 2.0  --   --  2.0  --   --   PHOS 2.5  --   --   --   --   --    Liver Function Tests: Recent Labs  Lab 09/11/19 0307 09/12/19 0255 09/13/19 0338 09/14/19 0402 09/15/19 0307  AST 123* 121* 177* 134* 78*  ALT 59* 68* 91* 91* 71*  ALKPHOS 93 81 85 87 81  BILITOT 0.6 1.1 0.7 1.1 0.6  PROT 6.9 6.7 7.1 8.0 7.0  ALBUMIN 3.0* 3.0* 3.1* 3.6 3.0*   No results for input(s): LIPASE, AMYLASE in the last 168 hours. Recent Labs  Lab 09/13/19 0338  AMMONIA 22   CBC: Recent Labs  Lab 09/11/19 0307 09/12/19 0255 09/13/19 0338 09/14/19 0402 09/15/19 0307  WBC 18.0* 15.8* 14.5* 17.5* 16.0*  NEUTROABS 8.9* 8.6* 8.1* 10.5* 9.0*  HGB 14.0 13.8 14.9 14.7 13.9  HCT 44.5 42.7 47.2 45.8 44.1  MCV 96.9 96.0 96.5 96.2 96.5  PLT 392 393 435* 463* 370   Cardiac Enzymes: Recent Labs  Lab 09/13/19 0338  CKTOTAL 747*   BNP: Invalid input(s): POCBNP CBG: No results for input(s): GLUCAP in the last 168 hours. D-Dimer No results for input(s): DDIMER in the last 72 hours. Hgb A1c No results for input(s): HGBA1C in the last 72 hours. Lipid Profile No results for input(s): CHOL, HDL, LDLCALC, TRIG, CHOLHDL, LDLDIRECT in the last 72 hours. Thyroid function studies No results for input(s): TSH, T4TOTAL, T3FREE, THYROIDAB in the last 72 hours.  Invalid input(s): FREET3 Anemia work up No results for input(s): VITAMINB12,  FOLATE, FERRITIN, TIBC, IRON, RETICCTPCT in the last 72 hours. Urinalysis    Component Value Date/Time   COLORURINE YELLOW (A) 03/26/2018 1312   APPEARANCEUR CLEAR (A) 03/26/2018 1312   LABSPEC 1.011 03/26/2018 1312   PHURINE 7.0 03/26/2018 1312   GLUCOSEU NEGATIVE 03/26/2018 1312   HGBUR NEGATIVE 03/26/2018 1312   BILIRUBINUR NEGATIVE  03/26/2018 1312   KETONESUR NEGATIVE 03/26/2018 1312   PROTEINUR NEGATIVE 03/26/2018 1312   NITRITE NEGATIVE 03/26/2018 1312   LEUKOCYTESUR NEGATIVE 03/26/2018 1312   Sepsis Labs Invalid input(s): PROCALCITONIN,  WBC,  LACTICIDVEN Microbiology Recent Results (from the past 240 hour(s))  SARS CORONAVIRUS 2 (TAT 6-24 HRS) Nasopharyngeal Nasopharyngeal Swab     Status: None   Collection Time: 09/14/19 12:11 PM   Specimen: Nasopharyngeal Swab  Result Value Ref Range Status   SARS Coronavirus 2 NEGATIVE NEGATIVE Final    Comment: (NOTE) SARS-CoV-2 target nucleic acids are NOT DETECTED. The SARS-CoV-2 RNA is generally detectable in upper and lower respiratory specimens during the acute phase of infection. Negative results do not preclude SARS-CoV-2 infection, do not rule out co-infections with other pathogens, and should not be used as the sole basis for treatment or other patient management decisions. Negative results must be combined with clinical observations, patient history, and epidemiological information. The expected result is Negative. Fact Sheet for Patients: HairSlick.nohttps://www.fda.gov/media/138098/download Fact Sheet for Healthcare Providers: quierodirigir.comhttps://www.fda.gov/media/138095/download This test is not yet approved or cleared by the [place] FDA and  has been authorized for detection and/or diagnosis of SARS-CoV-2 by FDA under an Emergency Use Authorization (EUA). This EUA will remain  in effect (meaning this test can be used) for the duration of the COVID-19 declaration under Section 56 4(b)(1) of the Act, 21 U.S.C. section 360bbb-3(b)(1), unless the authorization is terminated or revoked sooner. Performed at [facility], [address] [place], [place] [address]      Time coordinating discharge: Over 30 minutes  SIGNED:   Hughie Clossavi Abbygale Lapid, MD  [facility] 09/15/2019, 11:23 AM  If 7PM-7AM, please contact night-coverage [email or web address] Password  [email or web address]

## 2019-09-15 NOTE — Progress Notes (Signed)
Report called to [employer] and given to [employer]. [phone]

## 2019-09-15 NOTE — TOC Transition Note (Addendum)
Transition of Care [facility]) - CM/SW Discharge Note   Patient Details  Name: Kristopher Evans MRN: [id number] Date of Birth: 1934/10/28  [facility] CM/SW Contact:  Nila Nephew, LCSW Phone Number: [phone] 09/15/2019, 1:06 PM   Clinical Narrative:   14:21- Transportation approved [phone]- arranged transport   [place] authorization for non-emergency ambulance transport. Authorization for SNF admission provided this morning # [id number]  Pt admitting to [facility] room 104 p (private room). Report [phone].  Updated pt's niece who selected facility.    Final next level of care: Skilled Nursing Facility Barriers to Discharge: Insurance Authorization(for transportation)   Patient Goals and CMS Choice Patient states their goals for this hospitalization and ongoing recovery are:: did not participate, not oriented      Discharge Placement PASRR number recieved: 09/15/19            Patient chooses bed at: [facility] Patient to be transferred to facility by: [facility] Name of family member notified: niece Katharine Look Patient and family notified of of transfer: 09/15/19  Discharge Plan and Services In-house Referral: Clinical Social Work Discharge Planning Services: [employer] Consult                                 Social Determinants of Health ([place]) Interventions     Readmission Risk Interventions No flowsheet data found.

## 2019-09-16 DIAGNOSIS — F0390 Unspecified dementia without behavioral disturbance: Secondary | ICD-10-CM | POA: Diagnosis not present

## 2019-09-16 DIAGNOSIS — G4700 Insomnia, unspecified: Secondary | ICD-10-CM | POA: Diagnosis not present

## 2019-09-16 DIAGNOSIS — M6281 Muscle weakness (generalized): Secondary | ICD-10-CM | POA: Diagnosis not present

## 2019-09-17 DIAGNOSIS — F0390 Unspecified dementia without behavioral disturbance: Secondary | ICD-10-CM | POA: Diagnosis not present

## 2019-09-17 DIAGNOSIS — L0211 Cutaneous abscess of neck: Secondary | ICD-10-CM | POA: Diagnosis not present

## 2019-09-17 DIAGNOSIS — I10 Essential (primary) hypertension: Secondary | ICD-10-CM | POA: Diagnosis not present

## 2019-10-06 DIAGNOSIS — D649 Anemia, unspecified: Secondary | ICD-10-CM | POA: Diagnosis not present

## 2019-10-06 DIAGNOSIS — I10 Essential (primary) hypertension: Secondary | ICD-10-CM | POA: Diagnosis not present

## 2019-10-06 DIAGNOSIS — R1312 Dysphagia, oropharyngeal phase: Secondary | ICD-10-CM | POA: Diagnosis not present

## 2019-10-06 DIAGNOSIS — M6281 Muscle weakness (generalized): Secondary | ICD-10-CM | POA: Diagnosis not present

## 2019-11-25 DIAGNOSIS — K4091 Unilateral inguinal hernia, without obstruction or gangrene, recurrent: Secondary | ICD-10-CM | POA: Diagnosis not present

## 2019-11-25 DIAGNOSIS — L309 Dermatitis, unspecified: Secondary | ICD-10-CM | POA: Diagnosis not present

## 2019-12-16 DIAGNOSIS — F0391 Unspecified dementia with behavioral disturbance: Secondary | ICD-10-CM | POA: Diagnosis not present

## 2019-12-16 DIAGNOSIS — L02429 Furuncle of limb, unspecified: Secondary | ICD-10-CM | POA: Diagnosis not present

## 2020-01-11 ENCOUNTER — Emergency Department (HOSPITAL_COMMUNITY)
Admission: EM | Admit: 2020-01-11 | Discharge: 2020-01-11 | Disposition: A | Discharge status: Skilled Nursing Facility | Payer: PPO | Attending: Emergency Medicine | Admitting: Emergency Medicine

## 2020-01-11 ENCOUNTER — Emergency Department (HOSPITAL_COMMUNITY): Payer: PPO

## 2020-01-11 ENCOUNTER — Other Ambulatory Visit: Payer: Self-pay

## 2020-01-11 DIAGNOSIS — M25521 Pain in right elbow: Secondary | ICD-10-CM | POA: Insufficient documentation

## 2020-01-11 DIAGNOSIS — S199XXA Unspecified injury of neck, initial encounter: Secondary | ICD-10-CM | POA: Diagnosis not present

## 2020-01-11 DIAGNOSIS — R404 Transient alteration of awareness: Secondary | ICD-10-CM | POA: Diagnosis not present

## 2020-01-11 DIAGNOSIS — Y999 Unspecified external cause status: Secondary | ICD-10-CM | POA: Diagnosis not present

## 2020-01-11 DIAGNOSIS — S0990XA Unspecified injury of head, initial encounter: Secondary | ICD-10-CM | POA: Insufficient documentation

## 2020-01-11 DIAGNOSIS — E1165 Type 2 diabetes mellitus with hyperglycemia: Secondary | ICD-10-CM | POA: Diagnosis not present

## 2020-01-11 DIAGNOSIS — F0390 Unspecified dementia without behavioral disturbance: Secondary | ICD-10-CM | POA: Diagnosis not present

## 2020-01-11 DIAGNOSIS — W01198A Fall on same level from slipping, tripping and stumbling with subsequent striking against other object, initial encounter: Secondary | ICD-10-CM | POA: Insufficient documentation

## 2020-01-11 DIAGNOSIS — G44309 Post-traumatic headache, unspecified, not intractable: Secondary | ICD-10-CM | POA: Diagnosis not present

## 2020-01-11 DIAGNOSIS — R410 Disorientation, unspecified: Secondary | ICD-10-CM | POA: Diagnosis not present

## 2020-01-11 DIAGNOSIS — S59901A Unspecified injury of right elbow, initial encounter: Secondary | ICD-10-CM | POA: Diagnosis not present

## 2020-01-11 DIAGNOSIS — R52 Pain, unspecified: Secondary | ICD-10-CM | POA: Diagnosis not present

## 2020-01-11 DIAGNOSIS — Y92129 Unspecified place in nursing home as the place of occurrence of the external cause: Secondary | ICD-10-CM | POA: Insufficient documentation

## 2020-01-11 DIAGNOSIS — Y9389 Activity, other specified: Secondary | ICD-10-CM | POA: Insufficient documentation

## 2020-01-11 DIAGNOSIS — I10 Essential (primary) hypertension: Secondary | ICD-10-CM | POA: Diagnosis not present

## 2020-01-11 DIAGNOSIS — W19XXXA Unspecified fall, initial encounter: Secondary | ICD-10-CM

## 2020-01-11 LAB — CBG MONITORING, ED: Glucose-Capillary: 88 mg/dL (ref 70–99)

## 2020-01-11 NOTE — ED Provider Notes (Signed)
[facility]-EMERGENCY DEPT Provider Note   CSN: [id number] Arrival date & time: 01/11/20  1611     History Chief Complaint  Patient presents with  . Fall  . Elbow Pain    right side    Kristopher Evans is a 85 y.o. male.  HPI   85 year old male with history of dementia presenting for evaluation of fall.  There is a level 5 caveat as patient has history of dementia and is unable to provide any history.  Collateral history obtained from EMS who states that patient had a witnessed fall that occurred after he tripped over his shoe.  He fell and hit his head on a doorknob.  He did not have any loss of consciousness.  He was denying any neck or back pain however reportedly had right elbow pain.  I attempted to call the facility to obtain further history numerous times and was unable to reach anyone to gather additional history.  Past Medical History:  Diagnosis Date  . Dementia [facility])     Patient Active Problem List   Diagnosis Date Noted  . Acute respiratory failure with hypoxia (HCC) 09/05/2019  . Hypophosphatemia 09/05/2019  . Pressure injury of skin 09/04/2019  . Hypokalemia 09/04/2019  . Normocytic anemia 09/04/2019  . Neck abscess 09/02/2019  . Dementia (HCC) 09/02/2019  . HTN (hypertension) 09/02/2019  . Leucocytosis 09/02/2019    Past Surgical History:  Procedure Laterality Date  . MINOR IRRIGATION AND DEBRIDEMENT OF WOUND Left 09/04/2019   Procedure: IRRIGATION AND DEBRIDEMENT OF NECK ABCESS;  Surgeon: Osborn Coho, MD;  Location: [facility] ORS;  Service: ENT;  Laterality: Left;       No family history on file.  Social History   Tobacco Use  . Smoking status: Never Smoker  . Smokeless tobacco: Never Used  Substance Use Topics  . Alcohol use: Yes  . Drug use: Not on file    Home Medications Prior to Admission medications   Medication Sig Start Date End Date Taking? Authorizing Provider  aspirin EC 81 MG tablet Take 81 mg by mouth  daily.    [provider]  Cholecalciferol (VITAMIN D) 2000 units CAPS Take 2,000 Units by mouth daily.    [provider]  liver oil-zinc oxide (DESITIN) 40 % ointment Apply 1 application topically as needed for irritation.    [provider]  moxifloxacin (VIGAMOX) 0.5 % ophthalmic solution 1gtt to eyes, bilaterally, q2hours while awake for 2 days, THEN q6hrs for 5 days. Patient not taking: Reported on 09/02/2019 06/24/18   Myrna Blazer, MD  traZODone (DESYREL) 50 MG tablet Take 50 mg by mouth at bedtime.    [provider]  triamcinolone cream (KENALOG) 0.1 % Apply 1 application topically 3 (three) times daily.    [provider]  vitamin B-12 (CYANOCOBALAMIN) 1000 MCG tablet Take 1,000 mcg by mouth daily.    [provider]    Allergies    Penicillins  Review of Systems   Review of Systems  Unable to perform ROS: Dementia    Physical Exam Updated Vital Signs BP (!) 160/98 (BP Location: Left Arm)   Pulse 86   Temp 98.4 F (36.9 C) (Oral)   Resp 14   SpO2 100%   Physical Exam Vitals and nursing note reviewed.  Constitutional:      Appearance: He is well-developed.  HENT:     Head: Normocephalic and atraumatic.     Comments: No TTP to the head Eyes:  Conjunctiva/sclera: Conjunctivae normal.     Pupils: Pupils are equal, round, and reactive to light.  Cardiovascular:     Rate and Rhythm: Normal rate and regular rhythm.     Heart sounds: No murmur.  Pulmonary:     Effort: Pulmonary effort is normal. No respiratory distress.     Breath sounds: Normal breath sounds.  Abdominal:     Palpations: Abdomen is soft.     Tenderness: There is no abdominal tenderness.  Musculoskeletal:     Cervical back: Neck supple.     Comments: No obvious TTP to the cervical, thoracic or lumbar spine. No obvious pain with ROM of the entire BUE and BLE. No TTP to the hips, pelvis stable bilaterally. No obvious bruising or  significant hematoma other than to the left hand (this appears old and pt has no TTP)  Skin:    General: Skin is warm and dry.  Neurological:     Mental Status: He is alert.     Comments: No obvious facial droop. demented     ED Results / Procedures / Treatments   Labs (all labs ordered are listed, but only abnormal results are displayed) Labs Reviewed  CBG MONITORING, ED    EKG None  Radiology DG Elbow Complete Right  Result Date: 01/11/2020 CLINICAL DATA:  Witnessed fall, dementia, pain EXAM: RIGHT ELBOW - COMPLETE 3+ VIEW COMPARISON:  None. FINDINGS: Frontal, bilateral oblique, lateral views of the right elbow are obtained. No acute displaced fracture. Alignment is anatomic. Joint spaces are well preserved. No joint effusion. IMPRESSION: 1. No acute displaced fracture. Electronically Signed   By: Randa Ngo M.D.   On: 01/11/2020 18:15   CT Head Wo Contrast  Result Date: 01/11/2020 CLINICAL DATA:  Headache, posttraumatic. Additional history provided: 85 year old male with witness fall, patient reportedly fell and hit head on door knob. EXAM: CT HEAD WITHOUT CONTRAST CT CERVICAL SPINE WITHOUT CONTRAST TECHNIQUE: Multidetector CT imaging of the head and cervical spine was performed following the standard protocol without intravenous contrast. Multiplanar CT image reconstructions of the cervical spine were also generated. 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 Multiple chronic right basal ganglia lacunar infarcts, some of which were not present on prior head CT 12/14/2016. Background moderate ill-defined hypoattenuation within the cerebral white matter is nonspecific, but consistent with chronic small vessel ischemic disease. Redemonstrated chronic  infarcts within the left cerebellum. Stable, moderate generalized parenchymal atrophy. Vascular: No hyperdense vessel.  Atherosclerotic calcifications. Skull: Normal. Negative for fracture or focal lesion. Sinuses/Orbits: Visualized orbits demonstrate no acute abnormality. Mild ethmoid and maxillary sinus mucosal thickening. No significant mastoid effusion. CT CERVICAL SPINE FINDINGS Alignment: No significant spondylolisthesis. Skull base and vertebrae: The basion-dental and atlanto-dental intervals are maintained.No evidence of acute fracture to the cervical spine. Soft tissues and spinal canal: No prevertebral fluid or swelling. No visible canal hematoma. Disc levels: Cervical spondylosis. Most notably there is moderate/advanced disc space narrowing at C5-C6 and C6-C7 with posterior disc osteophyte complexes and uncovertebral hypertrophy at these levels. No high-grade bony spinal canal stenosis Upper chest: No consolidation within the imaged lung apices. No visible pneumothorax. IMPRESSION: CT head: 1. No evidence of acute intracranial abnormality. 2. Chronic ischemic changes with multiple chronic infarcts as described. Of note, there are chronic lacunar infarcts in the right basal ganglia which were not  present on prior head CT 12/14/2016. 3. Stable, moderate generalized parenchymal atrophy. 4. Mild ethmoid and maxillary sinus mucosal thickening CT cervical spine: 1. No evidence of acute fracture to the cervical spine. 2. Cervical spondylosis greatest at C5-C6 and C6-C7. Electronically Signed   By: Jackey Loge DO   On: 01/11/2020 19:13   CT Cervical Spine Wo Contrast  Result Date: 01/11/2020 CLINICAL DATA:  Headache, posttraumatic. Additional history provided: 85 year old male with witness fall, patient reportedly fell and hit head on door knob. EXAM: CT HEAD WITHOUT CONTRAST CT CERVICAL SPINE WITHOUT CONTRAST TECHNIQUE: Multidetector CT imaging of the head and cervical spine was performed following the  standard protocol without intravenous contrast. Multiplanar CT image reconstructions of the cervical spine were also generated. 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 Multiple chronic right basal ganglia lacunar infarcts, some of which were not present on prior head CT 12/14/2016. Background moderate ill-defined hypoattenuation within the cerebral white matter is nonspecific, but consistent with chronic small vessel ischemic disease. Redemonstrated chronic infarcts within the left cerebellum. Stable, moderate generalized parenchymal atrophy. Vascular: No hyperdense vessel.  Atherosclerotic calcifications. Skull: Normal. Negative for fracture or focal lesion. Sinuses/Orbits: Visualized orbits demonstrate no acute abnormality. Mild ethmoid and maxillary sinus mucosal thickening. No significant mastoid effusion. CT CERVICAL SPINE FINDINGS Alignment: No significant spondylolisthesis. Skull base and vertebrae: The basion-dental and atlanto-dental intervals are maintained.No evidence of acute fracture to the cervical spine. Soft tissues and spinal canal: No prevertebral fluid or swelling. No visible canal hematoma. Disc levels: Cervical spondylosis. Most notably there is moderate/advanced disc space narrowing at C5-C6 and C6-C7 with posterior disc osteophyte complexes and uncovertebral hypertrophy at these levels. No high-grade bony spinal canal stenosis Upper chest: No consolidation within the imaged lung apices. No visible pneumothorax. IMPRESSION: CT head: 1. No evidence of acute intracranial abnormality. 2. Chronic ischemic changes with multiple chronic infarcts as described. Of note, there are chronic lacunar infarcts in the right basal ganglia which were not present on prior head  CT 12/14/2016. 3. Stable, moderate generalized parenchymal atrophy. 4. Mild ethmoid and maxillary sinus mucosal thickening CT cervical spine: 1. No evidence of acute fracture to the cervical spine. 2. Cervical spondylosis greatest at C5-C6 and C6-C7. Electronically Signed   By: Jackey Loge DO   On: 01/11/2020 19:13    Procedures Procedures (including critical care time)  Medications Ordered in ED Medications - No data to display  ED Course  I have reviewed the triage vital signs and the nursing notes.  Pertinent labs & imaging results that were available during my care of the patient were reviewed by me and considered in my medical decision making (see chart for details).    MDM Rules/Calculators/A&P                      85 year old male with history of dementia presenting for evaluation after he had a witnessed fall at his facility.  Reportedly hit his head on a doorknob and also hurt his right elbow.  On my evaluation he is in a c-collar.  He is unable to answer any questions due to his history of dementia.  I attempted to call the facility several times to confirm that the fall was witnessed and that this is the patient's baseline.  It does appear per EMS  that he is at his baseline.  He reportedly had an elevated CBG in route with EMS however CBG is 88 here in the ED.  Will check imaging studies.  All imaging personally reviewed/interpreted CT head/cervical spine -agree with radiologist.  No acute evidence of fracture to the cervical spine no acute evidence of intracranial abnormality. Xray right elbow -negative for fracture  C-collar cleared.  Patient in no acute distress.  Will DC home back to his facility.  Discussed case with supervising physician, Dr. Rhunette Croft who is in agreement with the plan.  Final Clinical Impression(s) / ED Diagnoses Final diagnoses:  Fall, initial encounter    Rx / DC Orders ED Discharge Orders    None       Rayne Du 01/11/20  1953    Derwood Kaplan, MD 01/12/20 0131

## 2020-01-11 NOTE — ED Triage Notes (Signed)
Upon transfer of pt to stretcher it was noted that pts clothing were urine soaked. Pt was cleaned up and changed by nurse and NT, male pure wic applied, barrier ointment cream applied to area of skin break down on pts sacrum. Pt denies any pain at this time.

## 2020-01-11 NOTE — ED Notes (Signed)
Spoke with pts niece and provided update on pt status

## 2020-01-11 NOTE — ED Notes (Signed)
[facility] called for transport to [facility]

## 2020-01-11 NOTE — ED Notes (Signed)
Attempted to call report for pt tranfer back to facility. No answer and facility voicemail full.

## 2020-01-11 NOTE — ED Notes (Signed)
PTAR at pt bedside for transfer  Back to facility

## 2020-01-11 NOTE — ED Triage Notes (Signed)
85 y.o male BIB [facility] from [facility]. Facility called for witnessed fall, facility states that pt was walking and tripped over his shoe, fell and hit his head on door knob. No LOC per facility staff. Pt denies neck or back pain. Pt complaining of right elbow pain. There are no visible deformities per EMS. Pt has history of dementia and is confused at baseline.  Vitals: bp 190/90 ( pt has history of HTN but no meds listed in pt treatment record sent from facility) Hr 72 rr 16 cbg 270 (non diabetic) spo2 97 on room air

## 2020-01-11 NOTE — Discharge Instructions (Signed)
Patient had negative imaging studies of the head, neck and right elbow.  He was noted to have high blood pressure in the ED.  This may need to be checked and he may need to be started on medication.  If you have any new concerns about him please have him return to the emergency department.

## 2020-01-13 DIAGNOSIS — W1830XD Fall on same level, unspecified, subsequent encounter: Secondary | ICD-10-CM | POA: Diagnosis not present

## 2020-01-13 DIAGNOSIS — G4700 Insomnia, unspecified: Secondary | ICD-10-CM | POA: Diagnosis not present

## 2020-01-13 DIAGNOSIS — I10 Essential (primary) hypertension: Secondary | ICD-10-CM | POA: Diagnosis not present

## 2020-03-17 DIAGNOSIS — B351 Tinea unguium: Secondary | ICD-10-CM | POA: Diagnosis not present

## 2020-03-17 DIAGNOSIS — I739 Peripheral vascular disease, unspecified: Secondary | ICD-10-CM | POA: Diagnosis not present

## 2020-03-17 DIAGNOSIS — L84 Corns and callosities: Secondary | ICD-10-CM | POA: Diagnosis not present

## 2020-03-17 DIAGNOSIS — Q845 Enlarged and hypertrophic nails: Secondary | ICD-10-CM | POA: Diagnosis not present

## 2020-03-17 DIAGNOSIS — L603 Nail dystrophy: Secondary | ICD-10-CM | POA: Diagnosis not present

## 2020-04-01 ENCOUNTER — Emergency Department (HOSPITAL_COMMUNITY): Payer: PPO

## 2020-04-01 ENCOUNTER — Inpatient Hospital Stay (HOSPITAL_COMMUNITY)
Admission: EM | Admit: 2020-04-01 | Discharge: 2020-04-11 | DRG: 536 | Disposition: A | Discharge status: Skilled Nursing Facility | Payer: PPO | Source: Skilled Nursing Facility | Attending: Internal Medicine | Admitting: Internal Medicine

## 2020-04-01 ENCOUNTER — Encounter (HOSPITAL_COMMUNITY): Payer: Self-pay

## 2020-04-01 ENCOUNTER — Other Ambulatory Visit: Payer: Self-pay

## 2020-04-01 DIAGNOSIS — D649 Anemia, unspecified: Secondary | ICD-10-CM | POA: Diagnosis not present

## 2020-04-01 DIAGNOSIS — W06XXXA Fall from bed, initial encounter: Secondary | ICD-10-CM | POA: Diagnosis present

## 2020-04-01 DIAGNOSIS — M2550 Pain in unspecified joint: Secondary | ICD-10-CM | POA: Diagnosis not present

## 2020-04-01 DIAGNOSIS — Z7982 Long term (current) use of aspirin: Secondary | ICD-10-CM | POA: Diagnosis not present

## 2020-04-01 DIAGNOSIS — Z79899 Other long term (current) drug therapy: Secondary | ICD-10-CM | POA: Diagnosis not present

## 2020-04-01 DIAGNOSIS — S199XXA Unspecified injury of neck, initial encounter: Secondary | ICD-10-CM | POA: Diagnosis not present

## 2020-04-01 DIAGNOSIS — S72112A Displaced fracture of greater trochanter of left femur, initial encounter for closed fracture: Principal | ICD-10-CM | POA: Diagnosis present

## 2020-04-01 DIAGNOSIS — E559 Vitamin D deficiency, unspecified: Secondary | ICD-10-CM | POA: Diagnosis present

## 2020-04-01 DIAGNOSIS — Z66 Do not resuscitate: Secondary | ICD-10-CM | POA: Diagnosis not present

## 2020-04-01 DIAGNOSIS — F0280 Dementia in other diseases classified elsewhere without behavioral disturbance: Secondary | ICD-10-CM | POA: Diagnosis present

## 2020-04-01 DIAGNOSIS — I159 Secondary hypertension, unspecified: Secondary | ICD-10-CM | POA: Diagnosis not present

## 2020-04-01 DIAGNOSIS — K5730 Diverticulosis of large intestine without perforation or abscess without bleeding: Secondary | ICD-10-CM | POA: Diagnosis not present

## 2020-04-01 DIAGNOSIS — N210 Calculus in bladder: Secondary | ICD-10-CM | POA: Diagnosis present

## 2020-04-01 DIAGNOSIS — R739 Hyperglycemia, unspecified: Secondary | ICD-10-CM | POA: Diagnosis present

## 2020-04-01 DIAGNOSIS — L409 Psoriasis, unspecified: Secondary | ICD-10-CM | POA: Diagnosis not present

## 2020-04-01 DIAGNOSIS — R404 Transient alteration of awareness: Secondary | ICD-10-CM | POA: Diagnosis not present

## 2020-04-01 DIAGNOSIS — R5381 Other malaise: Secondary | ICD-10-CM | POA: Diagnosis not present

## 2020-04-01 DIAGNOSIS — D72828 Other elevated white blood cell count: Secondary | ICD-10-CM | POA: Diagnosis not present

## 2020-04-01 DIAGNOSIS — E785 Hyperlipidemia, unspecified: Secondary | ICD-10-CM | POA: Diagnosis not present

## 2020-04-01 DIAGNOSIS — I10 Essential (primary) hypertension: Secondary | ICD-10-CM | POA: Diagnosis not present

## 2020-04-01 DIAGNOSIS — E538 Deficiency of other specified B group vitamins: Secondary | ICD-10-CM | POA: Diagnosis not present

## 2020-04-01 DIAGNOSIS — I70202 Unspecified atherosclerosis of native arteries of extremities, left leg: Secondary | ICD-10-CM | POA: Diagnosis present

## 2020-04-01 DIAGNOSIS — Z20822 Contact with and (suspected) exposure to covid-19: Secondary | ICD-10-CM | POA: Diagnosis not present

## 2020-04-01 DIAGNOSIS — G9340 Encephalopathy, unspecified: Secondary | ICD-10-CM | POA: Diagnosis not present

## 2020-04-01 DIAGNOSIS — W19XXXA Unspecified fall, initial encounter: Secondary | ICD-10-CM | POA: Diagnosis not present

## 2020-04-01 DIAGNOSIS — S0990XA Unspecified injury of head, initial encounter: Secondary | ICD-10-CM | POA: Diagnosis not present

## 2020-04-01 DIAGNOSIS — G309 Alzheimer's disease, unspecified: Secondary | ICD-10-CM | POA: Diagnosis present

## 2020-04-01 DIAGNOSIS — Z515 Encounter for palliative care: Secondary | ICD-10-CM | POA: Diagnosis not present

## 2020-04-01 DIAGNOSIS — S72002A Fracture of unspecified part of neck of left femur, initial encounter for closed fracture: Secondary | ICD-10-CM | POA: Diagnosis not present

## 2020-04-01 DIAGNOSIS — R400 Somnolence: Secondary | ICD-10-CM | POA: Diagnosis not present

## 2020-04-01 DIAGNOSIS — D72829 Elevated white blood cell count, unspecified: Secondary | ICD-10-CM | POA: Diagnosis not present

## 2020-04-01 DIAGNOSIS — Y92003 Bedroom of unspecified non-institutional (private) residence as the place of occurrence of the external cause: Secondary | ICD-10-CM | POA: Diagnosis not present

## 2020-04-01 DIAGNOSIS — Z03818 Encounter for observation for suspected exposure to other biological agents ruled out: Secondary | ICD-10-CM | POA: Diagnosis not present

## 2020-04-01 DIAGNOSIS — F0390 Unspecified dementia without behavioral disturbance: Secondary | ICD-10-CM | POA: Diagnosis present

## 2020-04-01 DIAGNOSIS — S79912A Unspecified injury of left hip, initial encounter: Secondary | ICD-10-CM | POA: Diagnosis not present

## 2020-04-01 DIAGNOSIS — S299XXA Unspecified injury of thorax, initial encounter: Secondary | ICD-10-CM | POA: Diagnosis not present

## 2020-04-01 DIAGNOSIS — Z7401 Bed confinement status: Secondary | ICD-10-CM | POA: Diagnosis not present

## 2020-04-01 DIAGNOSIS — F0150 Vascular dementia without behavioral disturbance: Secondary | ICD-10-CM

## 2020-04-01 DIAGNOSIS — R0602 Shortness of breath: Secondary | ICD-10-CM

## 2020-04-01 HISTORY — DX: Deficiency of other specified B group vitamins: E53.8

## 2020-04-01 HISTORY — DX: Vitamin D deficiency, unspecified: E55.9

## 2020-04-01 HISTORY — DX: Psoriasis, unspecified: L40.9

## 2020-04-01 HISTORY — DX: Essential (primary) hypertension: I10

## 2020-04-01 HISTORY — DX: Hyperlipidemia, unspecified: E78.5

## 2020-04-01 LAB — CBC WITH DIFFERENTIAL/PLATELET
Abs Immature Granulocytes: 0.05 10*3/uL (ref 0.00–0.07)
Basophils Absolute: 0 10*3/uL (ref 0.0–0.1)
Basophils Relative: 0 %
Eosinophils Absolute: 0 10*3/uL (ref 0.0–0.5)
Eosinophils Relative: 0 %
HCT: 43.5 % (ref 39.0–52.0)
Hemoglobin: 14.2 g/dL (ref 13.0–17.0)
Immature Granulocytes: 0 %
Lymphocytes Relative: 25 %
Lymphs Abs: 3.3 10*3/uL (ref 0.7–4.0)
MCH: 30.7 pg (ref 26.0–34.0)
MCHC: 32.6 g/dL (ref 30.0–36.0)
MCV: 94.2 fL (ref 80.0–100.0)
Monocytes Absolute: 0.9 10*3/uL (ref 0.1–1.0)
Monocytes Relative: 7 %
Neutro Abs: 9 10*3/uL — ABNORMAL HIGH (ref 1.7–7.7)
Neutrophils Relative %: 68 %
Platelets: 262 10*3/uL (ref 150–400)
RBC: 4.62 MIL/uL (ref 4.22–5.81)
RDW: 14.6 % (ref 11.5–15.5)
WBC: 13.3 10*3/uL — ABNORMAL HIGH (ref 4.0–10.5)
nRBC: 0 % (ref 0.0–0.2)

## 2020-04-01 LAB — PROTIME-INR
INR: 1 (ref 0.8–1.2)
Prothrombin Time: 12.8 seconds (ref 11.4–15.2)

## 2020-04-01 LAB — TYPE AND SCREEN
ABO/RH(D): O NEG
Antibody Screen: NEGATIVE

## 2020-04-01 LAB — URINALYSIS, ROUTINE W REFLEX MICROSCOPIC
Bilirubin Urine: NEGATIVE
Glucose, UA: NEGATIVE mg/dL
Hgb urine dipstick: NEGATIVE
Ketones, ur: 20 mg/dL — AB
Leukocytes,Ua: NEGATIVE
Nitrite: NEGATIVE
Protein, ur: 30 mg/dL — AB
Specific Gravity, Urine: 1.021 (ref 1.005–1.030)
pH: 6 (ref 5.0–8.0)

## 2020-04-01 LAB — ABO/RH: ABO/RH(D): O NEG

## 2020-04-01 LAB — BASIC METABOLIC PANEL
Anion gap: 10 (ref 5–15)
BUN: 21 mg/dL (ref 8–23)
CO2: 27 mmol/L (ref 22–32)
Calcium: 8.8 mg/dL — ABNORMAL LOW (ref 8.9–10.3)
Chloride: 99 mmol/L (ref 98–111)
Creatinine, Ser: 0.98 mg/dL (ref 0.61–1.24)
GFR calc Af Amer: >60 mL/min (ref >60)
GFR calc non Af Amer: >60 mL/min (ref >60)
Glucose, Bld: 107 mg/dL — ABNORMAL HIGH (ref 70–99)
Potassium: 3.7 mmol/L (ref 3.5–5.1)
Sodium: 136 mmol/L (ref 135–145)

## 2020-04-01 LAB — CK: Total CK: 540 U/L — ABNORMAL HIGH (ref 49–397)

## 2020-04-01 LAB — SARS CORONAVIRUS 2 BY RT PCR (HOSPITAL ORDER, PERFORMED IN ~~LOC~~ HOSPITAL LAB): SARS Coronavirus 2: NEGATIVE

## 2020-04-01 MED ORDER — HYDROCODONE-ACETAMINOPHEN 5-325 MG PO TABS
1.0000 | ORAL_TABLET | Freq: Four times a day (QID) | ORAL | Status: DC | PRN
  Administered 2020-04-02 – 2020-04-09 (×3): 1 via ORAL
  Filled 2020-04-01 (×3): qty 1

## 2020-04-01 MED ORDER — BISACODYL 5 MG PO TBEC
5.0000 mg | DELAYED_RELEASE_TABLET | Freq: Every day | ORAL | Status: DC | PRN
  Filled 2020-04-01: qty 1

## 2020-04-01 MED ORDER — SENNA 8.6 MG PO TABS
1.0000 | ORAL_TABLET | Freq: Two times a day (BID) | ORAL | Status: DC
  Administered 2020-04-01 – 2020-04-11 (×12): 8.6 mg via ORAL
  Filled 2020-04-01 (×13): qty 1

## 2020-04-01 MED ORDER — VITAMIN D 25 MCG (1000 UNIT) PO TABS
2000.0000 [IU] | ORAL_TABLET | Freq: Every day | ORAL | Status: DC
  Administered 2020-04-01 – 2020-04-11 (×11): 2000 [IU] via ORAL
  Filled 2020-04-01 (×11): qty 2

## 2020-04-01 MED ORDER — TRAZODONE HCL 50 MG PO TABS
50.0000 mg | ORAL_TABLET | Freq: Every day | ORAL | Status: DC
  Administered 2020-04-01 – 2020-04-04 (×4): 50 mg via ORAL
  Filled 2020-04-01 (×4): qty 1

## 2020-04-01 MED ORDER — POLYETHYLENE GLYCOL 3350 17 G PO PACK
17.0000 g | PACK | Freq: Every day | ORAL | Status: DC | PRN
  Administered 2020-04-11: 17 g via ORAL
  Filled 2020-04-01: qty 1

## 2020-04-01 MED ORDER — MORPHINE SULFATE (PF) 2 MG/ML IV SOLN
0.5000 mg | INTRAVENOUS | Status: DC | PRN
  Administered 2020-04-01: 0.5 mg via INTRAVENOUS
  Filled 2020-04-01: qty 1

## 2020-04-01 MED ORDER — ZINC OXIDE 40 % EX OINT
1.0000 "application " | TOPICAL_OINTMENT | Freq: Two times a day (BID) | CUTANEOUS | Status: DC | PRN
  Filled 2020-04-01: qty 57

## 2020-04-01 MED ORDER — VITAMIN B-12 1000 MCG PO TABS
1000.0000 ug | ORAL_TABLET | Freq: Every day | ORAL | Status: DC
  Administered 2020-04-01 – 2020-04-11 (×11): 1000 ug via ORAL
  Filled 2020-04-01 (×12): qty 1

## 2020-04-01 MED ORDER — SODIUM CHLORIDE 0.9 % IV SOLN: INTRAVENOUS | Status: DC

## 2020-04-01 NOTE — ED Notes (Signed)
Pt had a  BM nurse and I change him and he was wet with urine when he get here. He has been change twice already

## 2020-04-01 NOTE — ED Triage Notes (Signed)
Pt arrives [facility] from [facility] for an unwitnessed fall that likely occurred yesterday. Pt has contusion on left hip. Pt has hx of dementia. Per EMS: concerns for UTI. Per facility the pt has been more confused recently. Pt denies any complaints at this time. No blood thinners. Unknown LOC.

## 2020-04-01 NOTE — Plan of Care (Signed)
  Problem: Nutrition: Goal: Adequate nutrition will be maintained Outcome: Progressing   Problem: Elimination: Goal: Will not experience complications related to urinary retention Outcome: Progressing   

## 2020-04-01 NOTE — ED Provider Notes (Signed)
[facility]-EMERGENCY DEPT Provider Note   CSN: [id number] Arrival date & time: 04/01/20  8841     History Chief Complaint  Patient presents with  . Fall    Kristopher Evans is a 85 y.o. male  With  Hx of dementia.  History is gathered from EMS.  5 caveat due to dementia patient had an unwitnessed fall at his memory care facility.  Patient had a notable bruise over the left hip.  He was sent in for further evaluation.  Patient does not take blood thinners.  HPI     Past Medical History:  Diagnosis Date  . Dementia (HCC)   . Hyperlipidemia   . Hypertension   . Psoriasis   . Vitamin B 12 deficiency   . Vitamin D deficiency     Patient Active Problem List   Diagnosis Date Noted  . Closed left hip fracture (HCC) 04/01/2020  . Vitamin D deficiency 04/01/2020  . Vitamin B12 deficiency 04/01/2020  . Acute respiratory failure with hypoxia (HCC) 09/05/2019  . Hypophosphatemia 09/05/2019  . Pressure injury of skin 09/04/2019  . Hypokalemia 09/04/2019  . Normocytic anemia 09/04/2019  . Neck abscess 09/02/2019  . Dementia (HCC) 09/02/2019  . HTN (hypertension) 09/02/2019  . Leucocytosis 09/02/2019    Past Surgical History:  Procedure Laterality Date  . MINOR IRRIGATION AND DEBRIDEMENT OF WOUND Left 09/04/2019   Procedure: IRRIGATION AND DEBRIDEMENT OF NECK ABCESS;  Surgeon: Osborn Coho, MD;  Location: [facility];  Service: ENT;  Laterality: Left;       History reviewed. No pertinent family history.  Social History   Tobacco Use  . Smoking status: Never Smoker  . Smokeless tobacco: Never Used  Substance Use Topics  . Alcohol use: Yes  . Drug use: Not on file    Home Medications Prior to Admission medications   Medication Sig Start Date End Date Taking? Authorizing Provider  aspirin EC 81 MG tablet Take 81 mg by mouth daily.   Yes [provider]  Cholecalciferol (VITAMIN D) 2000 units CAPS Take 2,000 Units by mouth daily.   Yes  [provider]  liver oil-zinc oxide (DESITIN) 40 % ointment Apply 1 application topically 2 (two) times daily as needed for irritation.    Yes [provider]  traZODone (DESYREL) 50 MG tablet Take 50 mg by mouth at bedtime.   Yes [provider]  triamcinolone cream (KENALOG) 0.1 % Apply 1 application topically 3 (three) times daily as needed (rash).    Yes [provider]  vitamin B-12 (CYANOCOBALAMIN) 1000 MCG tablet Take 1,000 mcg by mouth daily.   Yes [provider]  moxifloxacin (VIGAMOX) 0.5 % ophthalmic solution 1gtt to eyes, bilaterally, q2hours while awake for 2 days, THEN q6hrs for 5 days. Patient not taking: Reported on 09/02/2019 06/24/18   Myrna Blazer, MD    Allergies    Penicillins  Review of Systems   Review of Systems  Physical Exam Updated Vital Signs BP (!) 154/122   Pulse (!) 105   Temp 99 F (37.2 C) (Oral)   Resp 17   SpO2 95%   Physical Exam  ED Results / Procedures / Treatments   Labs (all labs ordered are listed, but only abnormal results are displayed) Labs Reviewed  BASIC METABOLIC PANEL - Abnormal; Notable for the following components:      Result Value   Glucose, Bld 107 (*)    Calcium 8.8 (*)    All other  components within normal limits  CBC WITH DIFFERENTIAL/PLATELET - Abnormal; Notable for the following components:   WBC 13.3 (*)    Neutro Abs 9.0 (*)    All other components within normal limits  CK - Abnormal; Notable for the following components:   Total CK 540 (*)    All other components within normal limits  URINALYSIS, ROUTINE W REFLEX MICROSCOPIC - Abnormal; Notable for the following components:   Ketones, ur 20 (*)    Protein, ur 30 (*)    Bacteria, UA RARE (*)    All other components within normal limits  SARS CORONAVIRUS 2 BY RT PCR (HOSPITAL ORDER, [facility])  PROTIME-INR  TYPE AND SCREEN  ABO/RH    EKG EKG  Interpretation  Date/Time:  Friday April 01 2020 08:23:25 EDT Ventricular Rate:  73 PR Interval:  100 QRS Duration: 84 QT Interval:  422 QTC Calculation: 464 R Axis:   63 Text Interpretation: Sinus rhythm with short PR with Premature supraventricular complexes Minimal voltage criteria for LVH, may be normal variant ( Sokolow-Lyon ) Anterior infarct , age undetermined Abnormal ECG Confirmed by Lacretia Leigh ([id number]) on 04/01/2020 1:11:16 PM   Radiology DG Chest 2 View  Result Date: 04/01/2020 CLINICAL DATA:  85 year old male status post unwitnessed fall. Increased confusion. EXAM: CHEST - 2 VIEW COMPARISON:  Portable chest 09/14/2019 and earlier. FINDINGS: Lung volumes are within normal limits. Calcified aortic atherosclerosis. Other mediastinal contours are within normal limits. Visualized tracheal air column is within normal limits. No pneumothorax, pulmonary edema, pleural effusion or confluent pulmonary opacity. Negative visible bowel gas pattern. No acute osseous abnormality identified. Abdominal Calcified aortic atherosclerosis. Stable cholecystectomy clips. IMPRESSION: No acute cardiopulmonary abnormality or acute traumatic injury identified. Electronically Signed   By: Genevie Ann M.D.   On: 04/01/2020 09:35   CT Head Wo Contrast  Result Date: 04/01/2020 CLINICAL DATA:  Unwitnessed fall, facial trauma, history of dementia, increased confusion, concerns for UTI EXAM: CT HEAD WITHOUT CONTRAST CT CERVICAL SPINE WITHOUT CONTRAST TECHNIQUE: Multidetector CT imaging of the head and cervical spine was performed following the standard protocol without intravenous contrast. Multiplanar CT image reconstructions of the cervical spine were also generated. COMPARISON:  01/11/2020 FINDINGS: CT HEAD FINDINGS Brain: No evidence of acute infarction, hemorrhage, hydrocephalus, extra-axial collection or mass lesion/mass effect. 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 Old right basal ganglia/caudate lacunar infarcts.  Subcortical white matter and periventricular small vessel ischemic changes. Global cortical atrophy. Vascular: Intracranial atherosclerosis. Skull: Normal. Negative for fracture or focal lesion. Sinuses/Orbits: The visualized paranasal sinuses are essentially clear. The mastoid air cells are unopacified. Other: None. CT CERVICAL SPINE FINDINGS Alignment: Normal cervical lordosis. Skull base and vertebrae: No acute fracture. No primary bone lesion or focal pathologic process. Soft tissues and spinal canal: No prevertebral fluid or swelling. No visible canal hematoma. Disc levels: Mild degenerative changes of the mid/lower cervical spine. Upper chest: Mild biapical pleural-parenchymal scarring. Other: Visualized thyroid is unremarkable. IMPRESSION: No evidence of acute intracranial abnormality. 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 Atrophy with small vessel ischemic changes. No evidence of traumatic injury to the cervical spine. Mild degenerative changes. Electronically Signed   By: Julian Hy M.D.   On: 04/01/2020 09:29   CT Cervical Spine Wo Contrast  Result Date: 04/01/2020 CLINICAL DATA:  Unwitnessed fall, facial trauma, history of dementia, increased confusion, concerns for UTI EXAM: CT HEAD WITHOUT CONTRAST CT CERVICAL SPINE WITHOUT CONTRAST TECHNIQUE: Multidetector CT imaging of the head and cervical spine was  performed following the standard protocol without intravenous contrast. Multiplanar CT image reconstructions of the cervical spine were also generated. COMPARISON:  01/11/2020 FINDINGS: CT HEAD FINDINGS Brain: No evidence of acute infarction, hemorrhage, hydrocephalus, extra-axial collection or mass lesion/mass effect. 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 Old right basal ganglia/caudate lacunar infarcts. Subcortical white matter and periventricular small vessel ischemic changes. Global cortical atrophy. Vascular: Intracranial atherosclerosis. Skull: Normal. Negative for fracture or focal  lesion. Sinuses/Orbits: The visualized paranasal sinuses are essentially clear. The mastoid air cells are unopacified. Other: None. CT CERVICAL SPINE FINDINGS Alignment: Normal cervical lordosis. Skull base and vertebrae: No acute fracture. No primary bone lesion or focal pathologic process. Soft tissues and spinal canal: No prevertebral fluid or swelling. No visible canal hematoma. Disc levels: Mild degenerative changes of the mid/lower cervical spine. Upper chest: Mild biapical pleural-parenchymal scarring. Other: Visualized thyroid is unremarkable. IMPRESSION: No evidence of acute intracranial abnormality. 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 Atrophy with small vessel ischemic changes. No evidence of traumatic injury to the cervical spine. Mild degenerative changes. Electronically Signed   By: Charline Bills M.D.   On: 04/01/2020 09:29   CT Hip Left Wo Contrast  Result Date: 04/01/2020 CLINICAL DATA:  Unwitnessed fall, contusion of the head, suspected fracture. EXAM: CT OF THE LEFT HIP WITHOUT CONTRAST TECHNIQUE: Multidetector CT imaging of the left hip was performed according to the standard protocol. Multiplanar CT image reconstructions were also generated. COMPARISON:  Radiographs of 04/01/2020 FINDINGS: Bones/Joint/Cartilage Mildly comminuted fracture of the greater trochanter although highly indistinct, there is a suspected nondisplaced intertrochanteric component, with part of this shown on image 38/6. Admittedly the suspected intertrochanteric component is subtle. Bridging spurring of the left sacroiliac joint. No appreciable fracture the regional bony pelvis. Ligaments Suboptimally assessed by CT. Muscles and Tendons Unremarkable Soft tissues A right groin hernia contains loops of bowel and extends down towards the scrotum. Iliac and left common femoral artery atherosclerosis. Dependent calcifications in the urinary bladder, probably small bladder stones, images 50 through 59 of series 3.  Sigmoid colon diverticulosis. The prostate gland indents the bladder base. IMPRESSION: 1. Mildly comminuted fracture of the greater trochanter. Although highly indistinct on CT, there is a suspected nondisplaced intertrochanteric component, with part of this shown on image 38/6. 2. Right groin hernia contains loops of bowel and extends down towards the scrotum. 3. Sigmoid colon diverticulosis. 4. Dependent calcifications in the urinary bladder, probably small bladder stones. 5. Iliac and left common femoral artery atherosclerosis. Electronically Signed   By: Gaylyn Rong M.D.   On: 04/01/2020 10:43   DG Hip Unilat With Pelvis 2-3 Views Left  Addendum Date: 04/01/2020   ADDENDUM REPORT: 04/01/2020 10:01 ADDENDUM: Study discussed by telephone with a provider on the admitting hospitalist service on 04/01/2020 at 0955 hours. She questioned the presence of an intertrochanteric fracture on these images. The intertrochanteric segment appears intact except there is an asymmetric cortical lucency along the superior margin of the greater trochanter. Therefore a subtle nondisplaced fracture is possible. And we discussed follow-up noncontrast Hip MRI (preferred modality) versus Hip CT for confirmation. Electronically Signed   By: Odessa Fleming M.D.   On: 04/01/2020 10:01   Result Date: 04/01/2020 CLINICAL DATA:  85 year old male status post unwitnessed fall. Increased confusion. EXAM: DG HIP (WITH OR WITHOUT PELVIS) 2-3V LEFT COMPARISON:  None. FINDINGS: Femoral heads are normally located. Bone mineralization is within normal limits for age. The pelvis is mildly oblique on the AP view. No pelvis fracture identified. Proximal right  femur appears intact. Proximal left femur appears intact. Iliofemoral calcified atherosclerosis. Negative lower abdominal and pelvic visceral contours. IMPRESSION: No acute fracture or dislocation identified about the left hip or pelvis. Electronically Signed: By: Odessa Fleming M.D. On: 04/01/2020  09:34    Procedures Procedures (including critical care time)  Medications Ordered in ED Medications  HYDROcodone-acetaminophen (NORCO/VICODIN) 5-325 MG per tablet 1 tablet (has no administration in time range)  morphine 2 MG/ML injection 0.5 mg (0.5 mg Intravenous Given 04/01/20 1229)  0.9 %  sodium chloride infusion (has no administration in time range)  senna (SENOKOT) tablet 8.6 mg (has no administration in time range)  bisacodyl (DULCOLAX) EC tablet 5 mg (has no administration in time range)  polyethylene glycol (MIRALAX / GLYCOLAX) packet 17 g (has no administration in time range)  traZODone (DESYREL) tablet 50 mg (has no administration in time range)  vitamin B-12 (CYANOCOBALAMIN) tablet 1,000 mcg (has no administration in time range)  cholecalciferol (VITAMIN D3) tablet 2,000 Units (has no administration in time range)  liver oil-zinc oxide (DESITIN) 40 % ointment 1 application (has no administration in time range)    ED Course  I have reviewed the triage vital signs and the nursing notes.  Pertinent labs & imaging results that were available during my care of the patient were reviewed by me and considered in my medical decision making (see chart for details).  Clinical Course as of Apr 01 1404  Fri Apr 01, 2020  0929 I  spoke with Dr. Roda Shutters who asks for CT Hips. He asks for transfer to [facility] and plans on surgery tomorrow. I spoke with the patient's niece and HCPOA who understands and was able to verbally consent for repair but has questions about the procedure I could not answer.    [AH]    Clinical Course User Index [AH] Arthor Captain, PA-C   MDM Rules/Calculators/A&P                           85 year old male here after unwitnessed fall, left hip pain. Obvious shortened left lower extremity with bruising over the left hip and tenderness. I ordered and reviewed labs which include urine which shows no evidence of infection, CBC with elevated white blood cell count likely  acute phase reaction, BMP with mildly elevated glucose of insignificant value.  CK slightly elevated at 540 does not qualify for rhabdo and patient creatinine and BUN are within normal limits.  I ordered and reviewed the patient's left hip x-ray which appears to show an intertrochanteric fracture on my evaluation however was read as normal by radiology.  Per request of Dr.XU patient will need CT of the left hip.  I personally reviewed the images of the head, neck CT and chest x-ray which show no acute abnormalities.  EKG shows normal sinus rhythm at a rate of 75 with LVH.  Patient is stable throughout his ED course and will need admission for left hip fracture.     Final Clinical Impression(s) / ED Diagnoses Final diagnoses:  Closed fracture of left hip, initial encounter [facility])    Rx / DC Orders ED Discharge Orders    None       Arthor Captain, PA-C 04/01/20 1405    Lorre Nick, MD 04/01/20 1501

## 2020-04-01 NOTE — ED Notes (Signed)
Pt to radiology.

## 2020-04-01 NOTE — Progress Notes (Signed)
Initial Nutrition Assessment  DOCUMENTATION CODES:   Not applicable  INTERVENTION:  - will order appropriate oral nutrition supplement following diet advancement. - weigh patient today.   NUTRITION DIAGNOSIS:   Increased nutrient needs related to post-op healing as evidenced by estimated needs.  GOAL:   Patient will meet greater than or equal to 90% of their needs  MONITOR:   Diet advancement, Labs, Weight trends, Skin  REASON FOR ASSESSMENT:   Consult Assessment of nutrition requirement/status  ASSESSMENT:   85 y.o. male with medical history of dementia, HTN, hyperlipidemia, PVD, psoriasis, vitamin D deficiency, and vitamin B12 deficiency. He presented to the ED from memory care facility with L hip pain after an unwitnessed fall that was thought to have occurred 6/17. Notes from the ED state that EMS reported that facility staff were concerned about possible UTI as patient has been more confused than his baseline.  Patient has been NPO since arrival to the ED. He remains in the ED at this time; did not see patient as he has not yet moved to the floor. Patient noted to have hx of dementia and he resides at a memory care unit.   Patient has not been weighed since 09/15/19 when he weighed 119 lb and prior to that he had not been weighed since 03/26/18 when he weighed 150 lb.   Per notes:  - closed L hip fx s/p unwitnessed fall - plan for pain control and attempt to work with PT prior to proceeding with surgery - leukocytosis    Labs reviewed.  Medications reviewed; 2000 units cholecalciferol/day, 1 tablet senokot BID, 1000 mcg oral cyanocobalamin/day.  IVF; NS @ 75 ml/hr.     NUTRITION - FOCUSED PHYSICAL EXAM:  unable to complete at this time.   Diet Order:   Diet Order    None      EDUCATION NEEDS:   No education needs have been identified at this time  Skin:  Skin Assessment: Reviewed RN Assessment  Last BM:  6/18  Height:   Ht Readings from Last 1  Encounters:  09/03/19 5\' 6"  (1.676 m)    Weight:   Wt Readings from Last 1 Encounters:  09/15/19 54.1 kg    Estimated Nutritional Needs:  Kcal:  1750-1950 kcal Protein:  90-105 grams Fluid:  >/= 2 L/day     14/01/20, MS, RD, LDN, CNSC Inpatient Clinical Dietitian RD pager # available in [facility]  After hours/weekend pager # available in [facility]

## 2020-04-01 NOTE — H&P (Signed)
History and Physical    Kristopher Evans VQM:[id number] DOB: [birth year] DOA: 04/01/2020  PCP: Patient, No Pcp Per  Patient coming from: [facility]  I have personally briefly reviewed patient's old medical records in [facility]  Chief Complaint: left hip pain s/p fall  HPI: Kristopher Evans is a 85 y.o. male with medical history significant of dementia, hypertension, hyperlipidemia, peripheral vascular disease, psoriasis, vitamin D deficiency, vitamin B12 deficiency who presented to the ED from [facility] placed today with left hip pain after an unwitnessed fall that was thought to have occurred yesterday.  History is limited by patient's dementia.  He is unable to tell me if he had a fall.  Currently denies pain and does not appear in any discomfort.  He does report his left hip hurts when I press on it.  Per report from EMS, staff at the facility expressed concerned for possible UTI as patient has been more confused than his baseline recently.  At his functional baseline, patient ambulates independently without assist device.  His niece reports that he spends his days walking the halls at his memory care facility.  She states that 99% of the time, patient is pleasant and cooperative, but did require sitter for safety during a previous admission last year.  ED Course: Temp 99.0, hypertensive at 178/84 with otherwise normal vitals.  CBC notable for leukocytosis 13.3k.  BMP unremarkable except for very mild hyperglycemia.  CT head was negative for acute findings, showed old lacunar infarcts, atrophy and chronic small vessel disease.  CT cervical spine was negative for fractures, showed mild degenerative changes.  Left hip x-ray was read as negative for fracture, therefore CT hip was obtained which does show comminuted fracture of the greater trochanter and possibly intertrochanteric as well.  Discussed with orthopedist Dr. Roda Shutters who recommended initially admitting for pain control and attempt PT  evaluation prior to proceeding with surgery.  Review of Systems: As per HPI otherwise 10 point review of systems negative.    Past Medical History:  Diagnosis Date  . Dementia (HCC)   . Hyperlipidemia   . Hypertension   . Psoriasis   . Vitamin B 12 deficiency   . Vitamin D deficiency     Past Surgical History:  Procedure Laterality Date  . MINOR IRRIGATION AND DEBRIDEMENT OF WOUND Left 09/04/2019   Procedure: IRRIGATION AND DEBRIDEMENT OF NECK ABCESS;  Surgeon: Osborn Coho, MD;  Location: [facility];  Service: ENT;  Laterality: Left;     reports that he has never smoked. He has never used smokeless tobacco. He reports current alcohol use. No history on file for drug use.  Allergies  Allergen Reactions  . Penicillins     Not listed on MAR     Family history reviewed and not pertinent    Prior to Admission medications   Medication Sig Start Date End Date Taking? Authorizing Provider  aspirin EC 81 MG tablet Take 81 mg by mouth daily.   Yes [provider]  Cholecalciferol (VITAMIN D) 2000 units CAPS Take 2,000 Units by mouth daily.   Yes [provider]  liver oil-zinc oxide (DESITIN) 40 % ointment Apply 1 application topically 2 (two) times daily as needed for irritation.    Yes [provider]  traZODone (DESYREL) 50 MG tablet Take 50 mg by mouth at bedtime.   Yes [provider]  triamcinolone cream (KENALOG) 0.1 % Apply 1 application topically 3 (three) times daily as needed (rash).  Yes [provider]  vitamin B-12 (CYANOCOBALAMIN) 1000 MCG tablet Take 1,000 mcg by mouth daily.   Yes [provider]  moxifloxacin (VIGAMOX) 0.5 % ophthalmic solution 1gtt to eyes, bilaterally, q2hours while awake for 2 days, THEN q6hrs for 5 days. Patient not taking: Reported on 09/02/2019 06/24/18   Myrna Blazer, MD    Physical Exam: Vitals:   04/01/20 0742 04/01/20 0802 04/01/20 1015  BP:  (!) 178/84 (!) 184/90    Pulse:  76 76  Resp:  16 18  Temp:  99 F (37.2 C)   TempSrc:  Oral   SpO2: 97% 100% 100%     Constitutional: NAD, calm, comfortable Eyes: PERRL, lids and conjunctivae normal ENMT: Mucous membranes are moist. Hearing is grossly normal.  Posterior oropharynx without exudates. Respiratory: CTAB, no wheezing, no crackles. Normal respiratory effort. No accessory muscle use.  Cardiovascular: RRR, no murmurs / rubs / gallops. No extremity edema. 2+ pedal pulses.  Abdomen: soft, NT, ND, no masses or HSM palpated. +Bowel sounds.  Musculoskeletal: left lateral hip is tender on palpation with faint ecchymosis present.  Normal muscle tone.  No gross deformities. Skin: dry, intact, normal color, normal temperature Neurologic: CN 2-12 grossly intact. Normal speech.  Grossly non-focal exam. Psychiatric: Alert and oriented to self only (knows first name only). Normal mood. Congruent affect.     Labs on Admission: I have personally reviewed following labs and imaging studies  CBC: Recent Labs  Lab 04/01/20 0844  WBC 13.3*  NEUTROABS 9.0*  HGB 14.2  HCT 43.5  MCV 94.2  PLT 262   Basic Metabolic Panel: Recent Labs  Lab 04/01/20 0844  NA 136  K 3.7  CL 99  CO2 27  GLUCOSE 107*  BUN 21  CREATININE 0.98  CALCIUM 8.8*   GFR: CrCl cannot be calculated (Unknown ideal weight.). Liver Function Tests: No results for input(s): AST, ALT, ALKPHOS, BILITOT, PROT, ALBUMIN in the last 168 hours. No results for input(s): LIPASE, AMYLASE in the last 168 hours. No results for input(s): AMMONIA in the last 168 hours. Coagulation Profile: Recent Labs  Lab 04/01/20 0844  INR 1.0   Cardiac Enzymes: Recent Labs  Lab 04/01/20 0844  CKTOTAL 540*   BNP (last 3 results) No results for input(s): PROBNP in the last 8760 hours. HbA1C: No results for input(s): HGBA1C in the last 72 hours. CBG: No results for input(s): GLUCAP in the last 168 hours. Lipid Profile: No results for input(s):  CHOL, HDL, LDLCALC, TRIG, CHOLHDL, LDLDIRECT in the last 72 hours. Thyroid Function Tests: No results for input(s): TSH, T4TOTAL, FREET4, T3FREE, THYROIDAB in the last 72 hours. Anemia Panel: No results for input(s): VITAMINB12, FOLATE, FERRITIN, TIBC, IRON, RETICCTPCT in the last 72 hours. Urine analysis:    Component Value Date/Time   COLORURINE YELLOW (A) 03/26/2018 1312   APPEARANCEUR CLEAR (A) 03/26/2018 1312   LABSPEC 1.011 03/26/2018 1312   PHURINE 7.0 03/26/2018 1312   GLUCOSEU NEGATIVE 03/26/2018 1312   HGBUR NEGATIVE 03/26/2018 1312   BILIRUBINUR NEGATIVE 03/26/2018 1312   KETONESUR NEGATIVE 03/26/2018 1312   PROTEINUR NEGATIVE 03/26/2018 1312   NITRITE NEGATIVE 03/26/2018 1312   LEUKOCYTESUR NEGATIVE 03/26/2018 1312    Radiological Exams on Admission: DG Chest 2 View  Result Date: 04/01/2020 CLINICAL DATA:  85 year old male status post unwitnessed fall. Increased confusion. EXAM: CHEST - 2 VIEW COMPARISON:  Portable chest 09/14/2019 and earlier. FINDINGS: Lung volumes are within normal limits. Calcified aortic atherosclerosis. Other mediastinal contours are within  normal limits. Visualized tracheal air column is within normal limits. No pneumothorax, pulmonary edema, pleural effusion or confluent pulmonary opacity. Negative visible bowel gas pattern. No acute osseous abnormality identified. Abdominal Calcified aortic atherosclerosis. Stable cholecystectomy clips. IMPRESSION: No acute cardiopulmonary abnormality or acute traumatic injury identified. Electronically Signed   By: Genevie Ann M.D.   On: 04/01/2020 09:35   CT Head Wo Contrast  Result Date: 04/01/2020 CLINICAL DATA:  Unwitnessed fall, facial trauma, history of dementia, increased confusion, concerns for UTI EXAM: CT HEAD WITHOUT CONTRAST CT CERVICAL SPINE WITHOUT CONTRAST TECHNIQUE: Multidetector CT imaging of the head and cervical spine was performed following the standard protocol without intravenous contrast.  Multiplanar CT image reconstructions of the cervical spine were also generated. COMPARISON:  01/11/2020 FINDINGS: CT HEAD FINDINGS Brain: No evidence of acute infarction, hemorrhage, hydrocephalus, extra-axial collection or mass lesion/mass effect. 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 Old right basal ganglia/caudate lacunar infarcts. Subcortical white matter and periventricular small vessel ischemic changes. Global cortical atrophy. Vascular: Intracranial atherosclerosis. Skull: Normal. Negative for fracture or focal lesion. Sinuses/Orbits: The visualized paranasal sinuses are essentially clear. The mastoid air cells are unopacified. Other: None. CT CERVICAL SPINE FINDINGS Alignment: Normal cervical lordosis. Skull base and vertebrae: No acute fracture. No primary bone lesion or focal pathologic process. Soft tissues and spinal canal: No prevertebral fluid or swelling. No visible canal hematoma. Disc levels: Mild degenerative changes of the mid/lower cervical spine. Upper chest: Mild biapical pleural-parenchymal scarring. Other: Visualized thyroid is unremarkable. IMPRESSION: No evidence of acute intracranial abnormality. 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 Atrophy with small vessel ischemic changes. No evidence of traumatic injury to the cervical spine. Mild degenerative changes. Electronically Signed   By: Julian Hy M.D.   On: 04/01/2020 09:29   CT Cervical Spine Wo Contrast  Result Date: 04/01/2020 CLINICAL DATA:  Unwitnessed fall, facial trauma, history of dementia, increased confusion, concerns for UTI EXAM: CT HEAD WITHOUT CONTRAST CT CERVICAL SPINE WITHOUT CONTRAST TECHNIQUE: Multidetector CT imaging of the head and cervical spine was performed following the standard protocol without intravenous contrast. Multiplanar CT image reconstructions of the cervical spine were also generated. COMPARISON:  01/11/2020 FINDINGS: CT HEAD FINDINGS Brain: No evidence of acute infarction, hemorrhage,  hydrocephalus, extra-axial collection or mass lesion/mass effect. 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 Old right basal ganglia/caudate lacunar infarcts. Subcortical white matter and periventricular small vessel ischemic changes. Global cortical atrophy. Vascular: Intracranial atherosclerosis. Skull: Normal. Negative for fracture or focal lesion. Sinuses/Orbits: The visualized paranasal sinuses are essentially clear. The mastoid air cells are unopacified. Other: None. CT CERVICAL SPINE FINDINGS Alignment: Normal cervical lordosis. Skull base and vertebrae: No acute fracture. No primary bone lesion or focal pathologic process. Soft tissues and spinal canal: No prevertebral fluid or swelling. No visible canal hematoma. Disc levels: Mild degenerative changes of the mid/lower cervical spine. Upper chest: Mild biapical pleural-parenchymal scarring. Other: Visualized thyroid is unremarkable. IMPRESSION: No evidence of acute intracranial abnormality. 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 Atrophy with small vessel ischemic changes. No evidence of traumatic injury to the cervical spine. Mild degenerative changes. Electronically Signed   By: Julian Hy M.D.   On: 04/01/2020 09:29   CT Hip Left Wo Contrast  Result Date: 04/01/2020 CLINICAL DATA:  Unwitnessed fall, contusion of the head, suspected fracture. EXAM: CT OF THE LEFT HIP WITHOUT CONTRAST TECHNIQUE: Multidetector CT imaging of the left hip was performed according to the standard protocol. Multiplanar CT image reconstructions were also generated. COMPARISON:  Radiographs of 04/01/2020 FINDINGS:  Bones/Joint/Cartilage Mildly comminuted fracture of the greater trochanter although highly indistinct, there is a suspected nondisplaced intertrochanteric component, with part of this shown on image 38/6. Admittedly the suspected intertrochanteric component is subtle. Bridging spurring of the left sacroiliac joint. No appreciable fracture the regional bony pelvis.  Ligaments Suboptimally assessed by CT. Muscles and Tendons Unremarkable Soft tissues A right groin hernia contains loops of bowel and extends down towards the scrotum. Iliac and left common femoral artery atherosclerosis. Dependent calcifications in the urinary bladder, probably small bladder stones, images 50 through 59 of series 3. Sigmoid colon diverticulosis. The prostate gland indents the bladder base. IMPRESSION: 1. Mildly comminuted fracture of the greater trochanter. Although highly indistinct on CT, there is a suspected nondisplaced intertrochanteric component, with part of this shown on image 38/6. 2. Right groin hernia contains loops of bowel and extends down towards the scrotum. 3. Sigmoid colon diverticulosis. 4. Dependent calcifications in the urinary bladder, probably small bladder stones. 5. Iliac and left common femoral artery atherosclerosis. Electronically Signed   By: Gaylyn RongWalter  Liebkemann M.D.   On: 04/01/2020 10:43   DG Hip Unilat With Pelvis 2-3 Views Left  Addendum Date: 04/01/2020   ADDENDUM REPORT: 04/01/2020 10:01 ADDENDUM: Study discussed by telephone with a provider on the admitting hospitalist service on 04/01/2020 at 0955 hours. She questioned the presence of an intertrochanteric fracture on these images. The intertrochanteric segment appears intact except there is an asymmetric cortical lucency along the superior margin of the greater trochanter. Therefore a subtle nondisplaced fracture is possible. And we discussed follow-up noncontrast Hip MRI (preferred modality) versus Hip CT for confirmation. Electronically Signed   By: Odessa FlemingH  Hall M.D.   On: 04/01/2020 10:01   Result Date: 04/01/2020 CLINICAL DATA:  85 year old male status post unwitnessed fall. Increased confusion. EXAM: DG HIP (WITH OR WITHOUT PELVIS) 2-3V LEFT COMPARISON:  None. FINDINGS: Femoral heads are normally located. Bone mineralization is within normal limits for age. The pelvis is mildly oblique on the AP view. No  pelvis fracture identified. Proximal right femur appears intact. Proximal left femur appears intact. Iliofemoral calcified atherosclerosis. Negative lower abdominal and pelvic visceral contours. IMPRESSION: No acute fracture or dislocation identified about the left hip or pelvis. Electronically Signed: By: Odessa FlemingH  Hall M.D. On: 04/01/2020 09:34    EKG: Independently reviewed.  Normal sinus rhythm 75 bpm, left ventricular hypertrophy, no acute ischemic changes.  Assessment/Plan Principal Problem:   Closed left hip fracture (HCC) Active Problems:   Leucocytosis   HTN (hypertension)   Dementia (HCC)   Vitamin D deficiency   Vitamin B12 deficiency    Closed left hip fracture - present on admission, s/p unwitnessed fall at SNF.  Xray read as negative for fracture, but due to high clinical suspicion, hip CT was obtained (findings as above). --Ortho following, Dr. Roda ShuttersXu --admit for pain control and attempt to work with PT prior to proceeding with surgery  --pain control per orders --bowel regimen --maintenance IV fluids --PT evaluation --TOC consulted  Leucocytosis -present on admission, WBC 13.3k.  Per report from EMS, concern for UTI due to recently increased confusion over his baseline.   --UA pending --will check CXR if UA is negative (no reports of respiratory symptoms)  Essential Hypertension - uncontrolled on admission 178/84, suspect due to pain.  Appears he is not on antihypertensive medications outpatient.   --PRN oral hydralazine for now --Monitor and consider daily medication if indicated    Dementia - unknown if any behavioral disturbances.  Not on medications  per med history.  Vitamin D deficiency - continue daily supplement  Vitamin B12 deficiency - continue daily supplement   DVT prophylaxis: SCDs Start: 04/01/20 1053   Code Status: DNR   Family Communication: spoke with patient's niece/POA by phone.  She provided additional history, was updated on the plan and  agreeable.  Disposition Plan: return to [facility] vs SNF/rehab  Consults called: orthopedics, Dr. Roda Shutters   Admission status:  Status is: Inpatient  Remains inpatient appropriate because of left hip fracture requiring further evaluation and potential surgery.   Dispo: The patient is from: [facility], [facility]              Anticipated d/c is to: same vs rehab              Anticipated d/c date is: 3 days              Patient currently is not medically stable to d/c.           Pennie Banter, DO [facility]  04/01/2020, 11:18 AM    If 7PM-7AM, please contact night-coverage. How to contact the [facility] Attending or Consulting provider 7A - 7P or covering provider during after hours 7P -7A, for this patient?    1. Check the care team in [facility] and look for a) attending/consulting [facility] provider listed and b) the [facility] team listed 2. Log into [email or web address] and use [place]'s universal password to access. If you do not have the password, please contact the hospital operator. 3. Locate the [facility] provider you are looking for under [facility] and page to a number that you can be directly reached. 4. If you still have difficulty reaching the provider, please page the [facility] (Director on Call) for the Hospitalists listed on amion for assistance.

## 2020-04-02 ENCOUNTER — Encounter (HOSPITAL_COMMUNITY)
Admission: EM | Disposition: A | Discharge status: Skilled Nursing Facility | Payer: Self-pay | Source: Skilled Nursing Facility | Attending: Internal Medicine

## 2020-04-02 DIAGNOSIS — S72002A Fracture of unspecified part of neck of left femur, initial encounter for closed fracture: Secondary | ICD-10-CM

## 2020-04-02 DIAGNOSIS — I159 Secondary hypertension, unspecified: Secondary | ICD-10-CM

## 2020-04-02 DIAGNOSIS — E559 Vitamin D deficiency, unspecified: Secondary | ICD-10-CM

## 2020-04-02 LAB — BASIC METABOLIC PANEL
Anion gap: 6 (ref 5–15)
BUN: 21 mg/dL (ref 8–23)
CO2: 26 mmol/L (ref 22–32)
Calcium: 8.2 mg/dL — ABNORMAL LOW (ref 8.9–10.3)
Chloride: 104 mmol/L (ref 98–111)
Creatinine, Ser: 1.01 mg/dL (ref 0.61–1.24)
GFR calc Af Amer: >60 mL/min (ref >60)
GFR calc non Af Amer: >60 mL/min (ref >60)
Glucose, Bld: 92 mg/dL (ref 70–99)
Potassium: 3.6 mmol/L (ref 3.5–5.1)
Sodium: 136 mmol/L (ref 135–145)

## 2020-04-02 LAB — CBC
HCT: 37.9 % — ABNORMAL LOW (ref 39.0–52.0)
Hemoglobin: 12.4 g/dL — ABNORMAL LOW (ref 13.0–17.0)
MCH: 30.8 pg (ref 26.0–34.0)
MCHC: 32.7 g/dL (ref 30.0–36.0)
MCV: 94 fL (ref 80.0–100.0)
Platelets: 222 10*3/uL (ref 150–400)
RBC: 4.03 MIL/uL — ABNORMAL LOW (ref 4.22–5.81)
RDW: 14.6 % (ref 11.5–15.5)
WBC: 12.8 10*3/uL — ABNORMAL HIGH (ref 4.0–10.5)
nRBC: 0 % (ref 0.0–0.2)

## 2020-04-02 LAB — CK: Total CK: 455 U/L — ABNORMAL HIGH (ref 49–397)

## 2020-04-02 SURGERY — FIXATION, FRACTURE, INTERTROCHANTERIC, WITH INTRAMEDULLARY ROD
Anesthesia: Choice | Laterality: Left

## 2020-04-02 MED ORDER — HYDRALAZINE HCL 10 MG PO TABS
10.0000 mg | ORAL_TABLET | Freq: Four times a day (QID) | ORAL | Status: DC | PRN
  Filled 2020-04-02: qty 1

## 2020-04-02 MED ORDER — HYDRALAZINE HCL 20 MG/ML IJ SOLN
10.0000 mg | Freq: Four times a day (QID) | INTRAMUSCULAR | Status: DC | PRN
  Administered 2020-04-02: 10 mg via INTRAVENOUS
  Filled 2020-04-02: qty 1

## 2020-04-02 NOTE — Plan of Care (Signed)
°  Problem: Education: °Goal: Knowledge of General Education information will improve °Description: Including pain rating scale, medication(s)/side effects and non-pharmacologic comfort measures °Outcome: Progressing °  °Problem: Health Behavior/Discharge Planning: °Goal: Ability to manage health-related needs will improve °Outcome: Progressing °  °Problem: Clinical Measurements: °Goal: Ability to maintain clinical measurements within normal limits will improve °Outcome: Progressing °  °Problem: Clinical Measurements: °Goal: Diagnostic test results will improve °Outcome: Progressing °  °Problem: Clinical Measurements: °Goal: Cardiovascular complication will be avoided °Outcome: Progressing °  °

## 2020-04-02 NOTE — Evaluation (Signed)
Physical Therapy Evaluation Patient Details Name: Kristopher Evans MRN: [id number] DOB: 07-09-1935 Today's Date: 04/02/2020   History of Present Illness  Pt s/p fall at facility([facility]) and now with L hip contusion and per imaging, mildly comminuted fx of L greater trochanter and suspected non-displaced intertrochanteric component.  Per order "Ortho not entirely convinced of a true fx".  Pt with hx of dementia  Clinical Impression  Pt admitted as above and presenting with functional mobility limitations 2* significant lethargy and balance deficits.  Pt up to sitting EOB but requiring assist to maintain balance entire time.  Standing/gait deferred 2* pt limited participation and lack of WB order on chart - RN has reached out for clarification.  Pt will likely need follow up rehab at SNF level to maximize IND and safety.    Follow Up Recommendations SNF    Equipment Recommendations  None recommended by PT    Recommendations for Other Services       Precautions / Restrictions Precautions Precautions: Fall Restrictions Other Position/Activity Restrictions: WB order not on chart.  RN has requested clarification      Mobility  Bed Mobility Overal bed mobility: Needs Assistance Bed Mobility: Supine to Sit;Sit to Supine     Supine to sit: Max assist;Total assist;+2 for physical assistance Sit to supine: Max assist;Total assist;+2 for physical assistance   General bed mobility comments: Pt assisted to EOB sitting with use of pad.  Pt lethargic and roused somewhat at EOB sitting but unable to maintain balance without use of hands  Transfers                 General transfer comment: Not tested 2* pt lethargy and no WB order on chart  Ambulation/Gait                Stairs            Wheelchair Mobility    Modified Rankin (Stroke Patients Only)       Balance Overall balance assessment: Needs assistance Sitting-balance support: Bilateral upper extremity  supported Sitting balance-Leahy Scale: Poor                                       Pertinent Vitals/Pain Pain Assessment: Faces Faces Pain Scale: No hurt    Home Living Family/patient expects to be discharged to:: Skilled nursing facility                      Prior Function           Comments: Pt fell ambulating at Memory Care      Hand Dominance        Extremity/Trunk Assessment   Upper Extremity Assessment Upper Extremity Assessment: Difficult to assess due to impaired cognition    Lower Extremity Assessment Lower Extremity Assessment: Difficult to assess due to impaired cognition       Communication      Cognition Arousal/Alertness: Lethargic;Suspect due to medications Behavior During Therapy: Flat affect Overall Cognitive Status: History of cognitive impairments - at baseline                                        General Comments      Exercises     Assessment/Plan    PT Assessment Patient needs continued  PT services  PT Problem List Decreased strength;Decreased range of motion;Decreased activity tolerance;Decreased balance;Decreased mobility;Decreased cognition;Decreased knowledge of use of DME       PT Treatment Interventions DME instruction;Gait training;Functional mobility training;Therapeutic activities;Therapeutic exercise;Balance training;Patient/family education    PT Goals (Current goals can be found in the Care Plan section)  Acute Rehab PT Goals Patient Stated Goal: No goals stated PT Goal Formulation: With patient Time For Goal Achievement: 04/16/20 Potential to Achieve Goals: Fair    Frequency Min 2X/week   Barriers to discharge        Co-evaluation               AM-PAC PT "6 Clicks" Mobility  Outcome Measure Help needed turning from your back to your side while in a flat bed without using bedrails?: Total Help needed moving from lying on your back to sitting on the side of a  flat bed without using bedrails?: Total Help needed moving to and from a bed to a chair (including a wheelchair)?: Total Help needed standing up from a chair using your arms (e.g., wheelchair or bedside chair)?: Total Help needed to walk in hospital room?: Total Help needed climbing 3-5 steps with a railing? : Total 6 Click Score: 6    End of Session Equipment Utilized During Treatment: Gait belt Activity Tolerance: Patient limited by lethargy Patient left: in bed;with call bell/phone within reach;with bed alarm set Nurse Communication: Mobility status PT Visit Diagnosis: Difficulty in walking, not elsewhere classified (R26.2)    Time: 1205-1221 PT Time Calculation (min) (ACUTE ONLY): 16 min   Charges:   PT Evaluation $PT Eval Low Complexity: [place] Pager [phone] Office [phone]   [name] 04/02/2020, 1:27 PM

## 2020-04-02 NOTE — Progress Notes (Signed)
PROGRESS NOTE    Kristopher Evans  NKN:[id number] DOB: 1935/08/11 DOA: 04/01/2020 PCP: Patient, No Pcp Per   Brief Narrative:  HPI per Dr. Esaw Grandchild on 04/01/20 Kristopher Evans is a 85 y.o. male with medical history significant of dementia, hypertension, hyperlipidemia, peripheral vascular disease, psoriasis, vitamin D deficiency, vitamin B12 deficiency who presented to the ED from watchman placed today with left hip pain after an unwitnessed fall that was thought to have occurred yesterday.  History is limited by patient's dementia.  He is unable to tell me if he had a fall.  Currently denies pain and does not appear in any discomfort.  He does report his left hip hurts when I press on it.  Per report from EMS, staff at the facility expressed concerned for possible UTI as patient has been more confused than his baseline recently.  At his functional baseline, patient ambulates independently without assist device.  His niece reports that he spends his days walking the halls at his memory care facility.  She states that 99% of the time, patient is pleasant and cooperative, but did require sitter for safety during a previous admission last year.  ED Course: Temp 99.0, hypertensive at 178/84 with otherwise normal vitals.  CBC notable for leukocytosis 13.3k.  BMP unremarkable except for very mild hyperglycemia.  CT head was negative for acute findings, showed old lacunar infarcts, atrophy and chronic small vessel disease.  CT cervical spine was negative for fractures, showed mild degenerative changes.  Left hip x-ray was read as negative for fracture, therefore CT hip was obtained which does show comminuted fracture of the greater trochanter and possibly intertrochanteric as well.  Discussed with orthopedist Dr. Roda Shutters who recommended initially admitting for pain control and attempt PT evaluation prior to proceeding with surgery.  **Interim History  Patient did not provide very much history today.  Working with  PT today.  Will need orthopedic evaluation prior to safe discharge disposition given that he possibly will require surgical intervention.   Assessment & Plan:   Principal Problem:   Closed left hip fracture (HCC) Active Problems:   Dementia (HCC)   HTN (hypertension)   Leucocytosis   Vitamin D deficiency   Vitamin B12 deficiency  Closed Left Hip Fracture  -Present on admission, s/p unwitnessed fall at SNF.   -Xray read as negative for fracture, but due to high clinical suspicion, hip CT was obtained -Hip CT showed "Mildly comminuted fracture of the greater trochanter. Although highly indistinct on CT, there is a suspected nondisplaced intertrochanteric component, with part of this shown on image 38/6. Right groin hernia contains loops of bowel and extends down towards the scrotum. Sigmoid colon diverticulosis. Dependent calcifications in the urinary bladder, probably small bladder stones. Iliac and left common femoral artery atherosclerosis." -Ortho following, Dr. Roda Shutters and appreciate further evaluation and insight -Admit for pain control and attempt to work with PT prior to proceeding with Surgery; ET evaluated and patient up to sitting at the edge of the bed but required assistance to maintain his balance entire time.  His standing and his gait was deferred secondary to the patient's limited participation.  He will need to be weightbearing as tolerated -Pain control per orders and he is on IV morphine 0.5 mg every 2 hours as needed for severe pain as well as hydrocodone-acetaminophen 1 tab p.o. every 6 as needed for moderate pain -Continue Bowel Regimen with MiraLAX 17 g p.o. daily as needed for moderate constipation along with Senokot 8.6  mg 1 tab p.o. twice daily; patient also has 5 mg of bisacodyl enteric coated tablets daily as needed for moderate constipation -Maintenance IV fluids initiated at 75 MLS per hour -PT evaluation and they evaluated and recommending SNF however standing/gait was  deferred secondary to patient limited participation lack of weightbearing -TOC consulted  Unwitnessed Fall -Patient's CT Head and Cervical Spine showed "No evidence of acute intracranial abnormality. 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 Atrophy with small vessel ischemic changes. No evidence of traumatic injury to the cervical spine. Mild degenerative changes.." -PT/OT to evaluate and Treat -U/A not consistent with infection   Leukocytosis  -present on admission, WBC 13.3k on admission and is now 12.8.  -Per report from EMS, concern for UTI due to recently increased confusion over his baseline.   -U/A showed a clear appearance with negative hemoglobin, negative glucose, 20 ketones, negative leukocytes, negative nitrites, rare bacteria, 0-5 RBC per high-power field, 0-5 WBCs and 0-5 squamous epithelial cells -Chest x-ray on Admission showed "No acute cardiopulmonary abnormality or acute traumatic injury Identified." -Likely leukocytosis is reactive in the setting of his fall Continue to monitor for signs and symptoms of infection -Repeat CBC in the a.m. and monitor temperature curve  Essential Hypertension  -Uncontrolled on admission 178/84, suspect due to pain.   -Appears he is not on antihypertensive medications outpatient.   -PRN oral hydralazine for now will be added 10 mg every 6 hours for systolic blood pressure greater than 160 or diastolic blood pressure greater than 90 -Monitor and consider daily medication if indicated   -Patient's blood pressure is 152/57 now  Elevated CK -Patient CK was 540 and now trending down to 455 -Continue IV fluid hydration as above -Repeat CK level in a.m.  Dementia -Unknown if any behavioral disturbances.   -Not on medications per med history. -Delirium precautions  Vitamin D Deficiency -Continue daily supplement with cholecalciferol 2000 units p.o. daily  Vitamin B12 Deficiency  -Continue daily supplement with cyanocobalamin  1000 mcg p.o. daily  DVT prophylaxis: SCDs Code Status: DO NOT RESUSCITATE  Family Communication: No family present at bedside  Disposition Plan: Pending further improvement and evaluation by Orthopedic Surgery  Status is: Inpatient  Remains inpatient appropriate because:Unsafe d/c plan, IV treatments appropriate due to intensity of illness or inability to take PO and Inpatient level of care appropriate due to severity of illness   Dispo: The patient is from: SNF              Anticipated d/c is to: SNF              Anticipated d/c date is: 1-2 Days               Patient currently is not medically stable to d/c.  Consultants:   Orthopedics Dr. Roda ShuttersXu   Procedures: None  Antimicrobials:  Anti-infectives (From admission, onward)   None      Subjective: Seen and examined at bedside he is pleasantly demented and encephalopathic and does not really respond.  He is resting and sleeping.  Does not follow commands when I asked him to try.  No nausea or vomiting.  No other concerns or complaints at this time.  Objective: Vitals:   04/01/20 1758 04/01/20 2012 04/01/20 2150 04/02/20 0609  BP: (!) 157/88  (!) 147/74 (!) 152/57  Pulse: 94  82 97  Resp: 17  16 19   Temp: 99.3 F (37.4 C)  98.4 F (36.9 C) 97.7 F (36.5 C)  TempSrc:  Oral  Axillary   SpO2: 98%  98% 91%  Weight:  54.1 kg    Height:  5\' 6"  (1.676 m)      Intake/Output Summary (Last 24 hours) at 04/02/2020 1353 Last data filed at 04/02/2020 1000 Gross per 24 hour  Intake 1268.11 ml  Output --  Net 1268.11 ml   Filed Weights   04/01/20 2012  Weight: 54.1 kg   Examination: Physical Exam:  Constitutional: Thin elderly demented Caucasian male currently in NAD and appears calm and comfortable Eyes: Lids and conjunctivae normal, sclerae anicteric  ENMT: External Ears, Nose appear normal. Grossly normal hearing.  Neck: Appears normal, supple, no cervical masses, normal ROM, no appreciable thyromegaly; no  JVD Respiratory: Diminished to auscultation bilaterally, no wheezing, rales, rhonchi or crackles. Normal respiratory effort and patient is not tachypenic. No accessory muscle use.  Unlabored breathing Cardiovascular: RRR, no murmurs / rubs / gallops. S1 and S2 auscultated.  Abdomen: Soft, non-tender, non-distended. Bowel sounds positive.  GU: Deferred. Musculoskeletal: No clubbing / cyanosis of digits/nails.  Skin: No rashes, lesions, ulcers on limited skin evaluation. No induration; Warm and dry.  Neurologic: CN 2-12 grossly intact with no focal deficits.  Romberg sign and cerebellar reflexes not assessed.  Psychiatric: Impaired judgment and insight.  He is not alert and oriented x 3. Normal mood and appropriate affect.   Data Reviewed: I have personally reviewed following labs and imaging studies  CBC: Recent Labs  Lab 04/01/20 0844 04/02/20 0246  WBC 13.3* 12.8*  NEUTROABS 9.0*  --   HGB 14.2 12.4*  HCT 43.5 37.9*  MCV 94.2 94.0  PLT 262 222   Basic Metabolic Panel: Recent Labs  Lab 04/01/20 0844 04/02/20 0246  NA 136 136  K 3.7 3.6  CL 99 104  CO2 27 26  GLUCOSE 107* 92  BUN 21 21  CREATININE 0.98 1.01  CALCIUM 8.8* 8.2*   GFR: Estimated Creatinine Clearance: 43.1 mL/min (by C-G formula based on SCr of 1.01 mg/dL). Liver Function Tests: No results for input(s): AST, ALT, ALKPHOS, BILITOT, PROT, ALBUMIN in the last 168 hours. No results for input(s): LIPASE, AMYLASE in the last 168 hours. No results for input(s): AMMONIA in the last 168 hours. Coagulation Profile: Recent Labs  Lab 04/01/20 0844  INR 1.0   Cardiac Enzymes: Recent Labs  Lab 04/01/20 0844 04/02/20 0900  CKTOTAL 540* 455*   BNP (last 3 results) No results for input(s): PROBNP in the last 8760 hours. HbA1C: No results for input(s): HGBA1C in the last 72 hours. CBG: No results for input(s): GLUCAP in the last 168 hours. Lipid Profile: No results for input(s): CHOL, HDL, LDLCALC, TRIG,  CHOLHDL, LDLDIRECT in the last 72 hours. Thyroid Function Tests: No results for input(s): TSH, T4TOTAL, FREET4, T3FREE, THYROIDAB in the last 72 hours. Anemia Panel: No results for input(s): VITAMINB12, FOLATE, FERRITIN, TIBC, IRON, RETICCTPCT in the last 72 hours. Sepsis Labs: No results for input(s): PROCALCITON, LATICACIDVEN in the last 168 hours.  Recent Results (from the past 240 hour(s))  SARS Coronavirus 2 by RT PCR (hospital order, performed in [facility] hospital lab) Nasopharyngeal Nasopharyngeal Swab     Status: None   Collection Time: 04/01/20  8:44 AM   Specimen: Nasopharyngeal Swab  Result Value Ref Range Status   SARS Coronavirus 2 NEGATIVE NEGATIVE Final    Comment: (NOTE) SARS-CoV-2 target nucleic acids are NOT DETECTED.  The SARS-CoV-2 RNA is generally detectable in upper and lower respiratory specimens during the acute phase  of infection. The lowest concentration of SARS-CoV-2 viral copies this assay can detect is 250 copies / mL. A negative result does not preclude SARS-CoV-2 infection and should not be used as the sole basis for treatment or other patient management decisions.  A negative result may occur with improper specimen collection / handling, submission of specimen other than nasopharyngeal swab, presence of viral mutation(s) within the areas targeted by this assay, and inadequate number of viral copies (<250 copies / mL). A negative result must be combined with clinical observations, patient history, and epidemiological information.  Fact Sheet for Patients:   BoilerBrush.com.cy  Fact Sheet for Healthcare Providers: https://pope.com/  This test is not yet approved or  cleared by the [place] FDA and has been authorized for detection and/or diagnosis of SARS-CoV-2 by FDA under an Emergency Use Authorization (EUA).  This EUA will remain in effect (meaning this test can be used) for the duration of  the COVID-19 declaration under Section 564(b)(1) of the Act, 21 U.S.C. section 360bbb-3(b)(1), unless the authorization is terminated or revoked sooner.  Performed at [facility], [address], [place], [place] [address]     RN Pressure Injury Documentation: Pressure Injury 09/03/19 Buttocks Medial Stage I -  Intact skin with non-blanchable redness of a localized area usually over a bony prominence. (Active)  09/03/19   Location: Buttocks  Location Orientation: Medial  Staging: Stage I -  Intact skin with non-blanchable redness of a localized area usually over a bony prominence.  Wound Description (Comments):   Present on Admission:      Estimated body mass index is 19.25 kg/m as calculated from the following:   Height as of this encounter: 5\' 6"  (1.676 m).   Weight as of this encounter: 54.1 kg.  Malnutrition Type:  Nutrition Problem: Increased nutrient needs Etiology: post-op healing   Malnutrition Characteristics:  Signs/Symptoms: estimated needs   Nutrition Interventions:  Interventions: Refer to RD note for recommendations   Radiology Studies: DG Chest 2 View  Result Date: 04/01/2020 CLINICAL DATA:  85 year old male status post unwitnessed fall. Increased confusion. EXAM: CHEST - 2 VIEW COMPARISON:  Portable chest 09/14/2019 and earlier. FINDINGS: Lung volumes are within normal limits. Calcified aortic atherosclerosis. Other mediastinal contours are within normal limits. Visualized tracheal air column is within normal limits. No pneumothorax, pulmonary edema, pleural effusion or confluent pulmonary opacity. Negative visible bowel gas pattern. No acute osseous abnormality identified. Abdominal Calcified aortic atherosclerosis. Stable cholecystectomy clips. IMPRESSION: No acute cardiopulmonary abnormality or acute traumatic injury identified. Electronically Signed   By: 09/16/2019 M.D.   On: 04/01/2020 09:35   CT Head Wo Contrast  Result Date:  04/01/2020 CLINICAL DATA:  Unwitnessed fall, facial trauma, history of dementia, increased confusion, concerns for UTI EXAM: CT HEAD WITHOUT CONTRAST CT CERVICAL SPINE WITHOUT CONTRAST TECHNIQUE: Multidetector CT imaging of the head and cervical spine was performed following the standard protocol without intravenous contrast. Multiplanar CT image reconstructions of the cervical spine were also generated. COMPARISON:  01/11/2020 FINDINGS: CT HEAD FINDINGS Brain: No evidence of acute infarction, hemorrhage, hydrocephalus, extra-axial collection or mass lesion/mass effect. 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 Old right basal ganglia/caudate lacunar infarcts. Subcortical white matter and periventricular small vessel ischemic changes. Global cortical atrophy. Vascular: Intracranial atherosclerosis. Skull: Normal. Negative for fracture or focal lesion. Sinuses/Orbits: The visualized paranasal sinuses are essentially clear. The mastoid air cells are unopacified. Other: None. CT CERVICAL SPINE FINDINGS Alignment: Normal cervical lordosis. Skull base and vertebrae: No acute fracture. No  primary bone lesion or focal pathologic process. Soft tissues and spinal canal: No prevertebral fluid or swelling. No visible canal hematoma. Disc levels: Mild degenerative changes of the mid/lower cervical spine. Upper chest: Mild biapical pleural-parenchymal scarring. Other: Visualized thyroid is unremarkable. IMPRESSION: No evidence of acute intracranial abnormality. 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 Atrophy with small vessel ischemic changes. No evidence of traumatic injury to the cervical spine. Mild degenerative changes. Electronically Signed   By: Charline Bills M.D.   On: 04/01/2020 09:29   CT Cervical Spine Wo Contrast  Result Date: 04/01/2020 CLINICAL DATA:  Unwitnessed fall, facial trauma, history of dementia, increased confusion, concerns for UTI EXAM: CT HEAD WITHOUT CONTRAST CT CERVICAL SPINE WITHOUT CONTRAST  TECHNIQUE: Multidetector CT imaging of the head and cervical spine was performed following the standard protocol without intravenous contrast. Multiplanar CT image reconstructions of the cervical spine were also generated. COMPARISON:  01/11/2020 FINDINGS: CT HEAD FINDINGS Brain: No evidence of acute infarction, hemorrhage, hydrocephalus, extra-axial collection or mass lesion/mass effect. 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 Old right basal ganglia/caudate lacunar infarcts. Subcortical white matter and periventricular small vessel ischemic changes. Global cortical atrophy. Vascular: Intracranial atherosclerosis. Skull: Normal. Negative for fracture or focal lesion. Sinuses/Orbits: The visualized paranasal sinuses are essentially clear. The mastoid air cells are unopacified. Other: None. CT CERVICAL SPINE FINDINGS Alignment: Normal cervical lordosis. Skull base and vertebrae: No acute fracture. No primary bone lesion or focal pathologic process. Soft tissues and spinal canal: No prevertebral fluid or swelling. No visible canal hematoma. Disc levels: Mild degenerative changes of the mid/lower cervical spine. Upper chest: Mild biapical pleural-parenchymal scarring. Other: Visualized thyroid is unremarkable. IMPRESSION: No evidence of acute intracranial abnormality. 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 Atrophy with small vessel ischemic changes. No evidence of traumatic injury to the cervical spine. Mild degenerative changes. Electronically Signed   By: Charline Bills M.D.   On: 04/01/2020 09:29   CT Hip Left Wo Contrast  Result Date: 04/01/2020 CLINICAL DATA:  Unwitnessed fall, contusion of the head, suspected fracture. EXAM: CT OF THE LEFT HIP WITHOUT CONTRAST TECHNIQUE: Multidetector CT imaging of the left hip was performed according to the standard protocol. Multiplanar CT image reconstructions were also generated. COMPARISON:  Radiographs of 04/01/2020 FINDINGS: Bones/Joint/Cartilage Mildly comminuted  fracture of the greater trochanter although highly indistinct, there is a suspected nondisplaced intertrochanteric component, with part of this shown on image 38/6. Admittedly the suspected intertrochanteric component is subtle. Bridging spurring of the left sacroiliac joint. No appreciable fracture the regional bony pelvis. Ligaments Suboptimally assessed by CT. Muscles and Tendons Unremarkable Soft tissues A right groin hernia contains loops of bowel and extends down towards the scrotum. Iliac and left common femoral artery atherosclerosis. Dependent calcifications in the urinary bladder, probably small bladder stones, images 50 through 59 of series 3. Sigmoid colon diverticulosis. The prostate gland indents the bladder base. IMPRESSION: 1. Mildly comminuted fracture of the greater trochanter. Although highly indistinct on CT, there is a suspected nondisplaced intertrochanteric component, with part of this shown on image 38/6. 2. Right groin hernia contains loops of bowel and extends down towards the scrotum. 3. Sigmoid colon diverticulosis. 4. Dependent calcifications in the urinary bladder, probably small bladder stones. 5. Iliac and left common femoral artery atherosclerosis. Electronically Signed   By: Gaylyn Rong M.D.   On: 04/01/2020 10:43   DG Hip Unilat With Pelvis 2-3 Views Left  Addendum Date: 04/01/2020   ADDENDUM REPORT: 04/01/2020 10:01 ADDENDUM: Study discussed by telephone with  a provider on the admitting hospitalist service on 04/01/2020 at 0955 hours. She questioned the presence of an intertrochanteric fracture on these images. The intertrochanteric segment appears intact except there is an asymmetric cortical lucency along the superior margin of the greater trochanter. Therefore a subtle nondisplaced fracture is possible. And we discussed follow-up noncontrast Hip MRI (preferred modality) versus Hip CT for confirmation. Electronically Signed   By: Genevie Ann M.D.   On: 04/01/2020 10:01    Result Date: 04/01/2020 CLINICAL DATA:  85 year old male status post unwitnessed fall. Increased confusion. EXAM: DG HIP (WITH OR WITHOUT PELVIS) 2-3V LEFT COMPARISON:  None. FINDINGS: Femoral heads are normally located. Bone mineralization is within normal limits for age. The pelvis is mildly oblique on the AP view. No pelvis fracture identified. Proximal right femur appears intact. Proximal left femur appears intact. Iliofemoral calcified atherosclerosis. Negative lower abdominal and pelvic visceral contours. IMPRESSION: No acute fracture or dislocation identified about the left hip or pelvis. Electronically Signed: By: Genevie Ann M.D. On: 04/01/2020 09:34   Scheduled Meds:  cholecalciferol  2,000 Units Oral Daily   senna  1 tablet Oral BID   traZODone  50 mg Oral QHS   vitamin B-12  1,000 mcg Oral Daily   Continuous Infusions:  sodium chloride 75 mL/hr at 04/02/20 0336    LOS: 1 day   Kerney Elbe, DO [facility] PAGER is on AMION  If 7PM-7AM, please contact night-coverage [email or web address]

## 2020-04-02 NOTE — Consult Note (Signed)
ORTHOPAEDIC CONSULTATION  REQUESTING PHYSICIAN: Merlene Laughter, DO  Chief Complaint: Left hip pain  HPI: Kristopher Cumpton Blaschis a 85 y.o.malewith medical history significant ofdementia, hypertension, hyperlipidemia, peripheral vascular disease, psoriasis, vitamin D deficiency, vitamin B12 deficiency who presented to the ED from watchman placed today with left hip pain after an unwitnessed fall that was thought to have occurred yesterday.  Xrays of left hip in ER were concerning for left IT fx but CT scan shows isolated left greater troch fx. Ortho consulted.  Past Medical History:  Diagnosis Date  . Dementia (HCC)   . Hyperlipidemia   . Hypertension   . Psoriasis   . Vitamin B 12 deficiency   . Vitamin D deficiency    Past Surgical History:  Procedure Laterality Date  . MINOR IRRIGATION AND DEBRIDEMENT OF WOUND Left 09/04/2019   Procedure: IRRIGATION AND DEBRIDEMENT OF NECK ABCESS;  Surgeon: Osborn Coho, MD;  Location: [facility];  Service: ENT;  Laterality: Left;   Social History   Socioeconomic History  . Marital status: Single    Spouse name: Not on file  . Number of children: Not on file  . Years of education: Not on file  . Highest education level: Not on file  Occupational History  . Not on file  Tobacco Use  . Smoking status: Never Smoker  . Smokeless tobacco: Never Used  Substance and Sexual Activity  . Alcohol use: Yes  . Drug use: Not on file  . Sexual activity: Not on file  Other Topics Concern  . Not on file  Social History Narrative   ** Merged History Encounter **       Social Determinants of Health   Financial Resource Strain:   . Difficulty of Paying Living Expenses:   Food Insecurity:   . Worried About Programme researcher, broadcasting/film/video in the Last Year:   . Barista in the Last Year:   Transportation Needs:   . Freight forwarder (Medical):   Marland Kitchen Lack of Transportation (Non-Medical):   Physical Activity:   . Days of Exercise per Week:   .  Minutes of Exercise per Session:   Stress:   . Feeling of Stress :   Social Connections:   . Frequency of Communication with Friends and Family:   . Frequency of Social Gatherings with Friends and Family:   . Attends Religious Services:   . Active Member of Clubs or Organizations:   . Attends Banker Meetings:   Marland Kitchen Marital Status:    History reviewed. No pertinent family history. - negative except otherwise stated in the family history section Allergies  Allergen Reactions  . Penicillins     Not listed on MAR   Prior to Admission medications   Medication Sig Start Date End Date Taking? Authorizing Provider  aspirin EC 81 MG tablet Take 81 mg by mouth daily.   Yes [provider]  Cholecalciferol (VITAMIN D) 2000 units CAPS Take 2,000 Units by mouth daily.   Yes [provider]  liver oil-zinc oxide (DESITIN) 40 % ointment Apply 1 application topically 2 (two) times daily as needed for irritation.    Yes [provider]  traZODone (DESYREL) 50 MG tablet Take 50 mg by mouth at bedtime.   Yes [provider]  triamcinolone cream (KENALOG) 0.1 % Apply 1 application topically 3 (three) times daily as needed (rash).    Yes [provider]  vitamin B-12 (CYANOCOBALAMIN) 1000 MCG tablet Take 1,000  mcg by mouth daily.   Yes [provider]  moxifloxacin (VIGAMOX) 0.5 % ophthalmic solution 1gtt to eyes, bilaterally, q2hours while awake for 2 days, THEN q6hrs for 5 days. Patient not taking: Reported on 09/02/2019 06/24/18   Orbie Pyo, MD   DG Chest 2 View  Result Date: 04/01/2020 CLINICAL DATA:  85 year old male status post unwitnessed fall. Increased confusion. EXAM: CHEST - 2 VIEW COMPARISON:  Portable chest 09/14/2019 and earlier. FINDINGS: Lung volumes are within normal limits. Calcified aortic atherosclerosis. Other mediastinal contours are within normal limits. Visualized tracheal air column is within normal  limits. No pneumothorax, pulmonary edema, pleural effusion or confluent pulmonary opacity. Negative visible bowel gas pattern. No acute osseous abnormality identified. Abdominal Calcified aortic atherosclerosis. Stable cholecystectomy clips. IMPRESSION: No acute cardiopulmonary abnormality or acute traumatic injury identified. Electronically Signed   By: Genevie Ann M.D.   On: 04/01/2020 09:35   CT Head Wo Contrast  Result Date: 04/01/2020 CLINICAL DATA:  Unwitnessed fall, facial trauma, history of dementia, increased confusion, concerns for UTI EXAM: CT HEAD WITHOUT CONTRAST CT CERVICAL SPINE WITHOUT CONTRAST TECHNIQUE: Multidetector CT imaging of the head and cervical spine was performed following the standard protocol without intravenous contrast. Multiplanar CT image reconstructions of the cervical spine were also generated. COMPARISON:  01/11/2020 FINDINGS: CT HEAD FINDINGS Brain: No evidence of acute infarction, hemorrhage, hydrocephalus, extra-axial collection or mass lesion/mass effect. 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 Old right basal ganglia/caudate lacunar infarcts. Subcortical white matter and periventricular small vessel ischemic changes. Global cortical atrophy. Vascular: Intracranial atherosclerosis. Skull: Normal. Negative for fracture or focal lesion. Sinuses/Orbits: The visualized paranasal sinuses are essentially clear. The mastoid air cells are unopacified. Other: None. CT CERVICAL SPINE FINDINGS Alignment: Normal cervical lordosis. Skull base and vertebrae: No acute fracture. No primary bone lesion or focal pathologic process. Soft tissues and spinal canal: No prevertebral fluid or swelling. No visible canal hematoma. Disc levels: Mild degenerative changes of the mid/lower cervical spine. Upper chest: Mild biapical pleural-parenchymal scarring. Other: Visualized thyroid is unremarkable. IMPRESSION: No evidence of acute intracranial abnormality. 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 Atrophy  with small vessel ischemic changes. No evidence of traumatic injury to the cervical spine. Mild degenerative changes. Electronically Signed   By: Julian Hy M.D.   On: 04/01/2020 09:29   CT Cervical Spine Wo Contrast  Result Date: 04/01/2020 CLINICAL DATA:  Unwitnessed fall, facial trauma, history of dementia, increased confusion, concerns for UTI EXAM: CT HEAD WITHOUT CONTRAST CT CERVICAL SPINE WITHOUT CONTRAST TECHNIQUE: Multidetector CT imaging of the head and cervical spine was performed following the standard protocol without intravenous contrast. Multiplanar CT image reconstructions of the cervical spine were also generated. COMPARISON:  01/11/2020 FINDINGS: CT HEAD FINDINGS Brain: No evidence of acute infarction, hemorrhage, hydrocephalus, extra-axial collection or mass lesion/mass effect. 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 Old right basal ganglia/caudate lacunar infarcts. Subcortical white matter and periventricular small vessel ischemic changes. Global cortical atrophy. Vascular: Intracranial atherosclerosis. Skull: Normal. Negative for fracture or focal lesion. Sinuses/Orbits: The visualized paranasal sinuses are essentially clear. The mastoid air cells are unopacified. Other: None. CT CERVICAL SPINE FINDINGS Alignment: Normal cervical lordosis. Skull base and vertebrae: No acute fracture. No primary bone lesion or focal pathologic process. Soft tissues and spinal canal: No prevertebral fluid or swelling. No visible canal hematoma. Disc levels: Mild degenerative changes of the mid/lower cervical spine. Upper chest: Mild biapical pleural-parenchymal scarring. Other: Visualized thyroid is unremarkable. IMPRESSION: No evidence of acute intracranial abnormality. Old cortical and lacunar  infarcts, as above. Atrophy with small vessel ischemic changes. No evidence of traumatic injury to the cervical spine. Mild degenerative changes. Electronically Signed   By: Charline Bills M.D.   On: 04/01/2020  09:29   CT Hip Left Wo Contrast  Result Date: 04/01/2020 CLINICAL DATA:  Unwitnessed fall, contusion of the head, suspected fracture. EXAM: CT OF THE LEFT HIP WITHOUT CONTRAST TECHNIQUE: Multidetector CT imaging of the left hip was performed according to the standard protocol. Multiplanar CT image reconstructions were also generated. COMPARISON:  Radiographs of 04/01/2020 FINDINGS: Bones/Joint/Cartilage Mildly comminuted fracture of the greater trochanter although highly indistinct, there is a suspected nondisplaced intertrochanteric component, with part of this shown on image 38/6. Admittedly the suspected intertrochanteric component is subtle. Bridging spurring of the left sacroiliac joint. No appreciable fracture the regional bony pelvis. Ligaments Suboptimally assessed by CT. Muscles and Tendons Unremarkable Soft tissues A right groin hernia contains loops of bowel and extends down towards the scrotum. Iliac and left common femoral artery atherosclerosis. Dependent calcifications in the urinary bladder, probably small bladder stones, images 50 through 59 of series 3. Sigmoid colon diverticulosis. The prostate gland indents the bladder base. IMPRESSION: 1. Mildly comminuted fracture of the greater trochanter. Although highly indistinct on CT, there is a suspected nondisplaced intertrochanteric component, with part of this shown on image 38/6. 2. Right groin hernia contains loops of bowel and extends down towards the scrotum. 3. Sigmoid colon diverticulosis. 4. Dependent calcifications in the urinary bladder, probably small bladder stones. 5. Iliac and left common femoral artery atherosclerosis. Electronically Signed   By: Gaylyn Rong M.D.   On: 04/01/2020 10:43   DG Hip Unilat With Pelvis 2-3 Views Left  Addendum Date: 04/01/2020   ADDENDUM REPORT: 04/01/2020 10:01 ADDENDUM: Study discussed by telephone with a provider on the admitting hospitalist service on 04/01/2020 at 0955 hours. She questioned  the presence of an intertrochanteric fracture on these images. The intertrochanteric segment appears intact except there is an asymmetric cortical lucency along the superior margin of the greater trochanter. Therefore a subtle nondisplaced fracture is possible. And we discussed follow-up noncontrast Hip MRI (preferred modality) versus Hip CT for confirmation. Electronically Signed   By: Odessa Fleming M.D.   On: 04/01/2020 10:01   Result Date: 04/01/2020 CLINICAL DATA:  85 year old male status post unwitnessed fall. Increased confusion. EXAM: DG HIP (WITH OR WITHOUT PELVIS) 2-3V LEFT COMPARISON:  None. FINDINGS: Femoral heads are normally located. Bone mineralization is within normal limits for age. The pelvis is mildly oblique on the AP view. No pelvis fracture identified. Proximal right femur appears intact. Proximal left femur appears intact. Iliofemoral calcified atherosclerosis. Negative lower abdominal and pelvic visceral contours. IMPRESSION: No acute fracture or dislocation identified about the left hip or pelvis. Electronically Signed: By: Odessa Fleming M.D. On: 04/01/2020 09:34   - pertinent xrays, CT, MRI studies were reviewed and independently interpreted  Positive ROS: All other systems have been reviewed and were otherwise negative with the exception of those mentioned in the HPI and as above.  Physical Exam: General: No acute distress Cardiovascular: No pedal edema Respiratory: No cyanosis, no use of accessory musculature GI: No organomegaly, abdomen is soft and non-tender Skin: No lesions in the area of chief complaint Neurologic: Sensation intact distally Psychiatric: Patient is at baseline mood and affect Lymphatic: No axillary or cervical lymphadenopathy  MUSCULOSKELETAL:  - patient is drowsy and sleeping and not arouseable to voice  - does not cooperate with exam - no NV compromise  Assessment: Left greater troch fx  Plan: - I have reviewed the CT in detail and I cannot find  conclusive evidence of an IT fx - I recommend seeing how he mobilizes with PT and WBAT  - we can obtain repeat left hip xrays in a week  Thank you for the consult and the opportunity to see Mr. Margie Billet, MD [facility] 4:13 PM

## 2020-04-03 DIAGNOSIS — D649 Anemia, unspecified: Secondary | ICD-10-CM

## 2020-04-03 LAB — CBC WITH DIFFERENTIAL/PLATELET
Abs Immature Granulocytes: 0.03 10*3/uL (ref 0.00–0.07)
Basophils Absolute: 0 10*3/uL (ref 0.0–0.1)
Basophils Relative: 0 %
Eosinophils Absolute: 0.2 10*3/uL (ref 0.0–0.5)
Eosinophils Relative: 2 %
HCT: 34.8 % — ABNORMAL LOW (ref 39.0–52.0)
Hemoglobin: 11.3 g/dL — ABNORMAL LOW (ref 13.0–17.0)
Immature Granulocytes: 0 %
Lymphocytes Relative: 30 %
Lymphs Abs: 3.6 10*3/uL (ref 0.7–4.0)
MCH: 31.2 pg (ref 26.0–34.0)
MCHC: 32.5 g/dL (ref 30.0–36.0)
MCV: 96.1 fL (ref 80.0–100.0)
Monocytes Absolute: 1 10*3/uL (ref 0.1–1.0)
Monocytes Relative: 9 %
Neutro Abs: 6.8 10*3/uL (ref 1.7–7.7)
Neutrophils Relative %: 59 %
Platelets: 207 10*3/uL (ref 150–400)
RBC: 3.62 MIL/uL — ABNORMAL LOW (ref 4.22–5.81)
RDW: 15 % (ref 11.5–15.5)
WBC: 11.7 10*3/uL — ABNORMAL HIGH (ref 4.0–10.5)
nRBC: 0 % (ref 0.0–0.2)

## 2020-04-03 LAB — COMPREHENSIVE METABOLIC PANEL
ALT: 16 U/L (ref 0–44)
AST: 25 U/L (ref 15–41)
Albumin: 2.9 g/dL — ABNORMAL LOW (ref 3.5–5.0)
Alkaline Phosphatase: 73 U/L (ref 38–126)
Anion gap: 1 — ABNORMAL LOW (ref 5–15)
BUN: 22 mg/dL (ref 8–23)
CO2: 27 mmol/L (ref 22–32)
Calcium: 7.7 mg/dL — ABNORMAL LOW (ref 8.9–10.3)
Chloride: 109 mmol/L (ref 98–111)
Creatinine, Ser: 0.99 mg/dL (ref 0.61–1.24)
GFR calc Af Amer: >60 mL/min (ref >60)
GFR calc non Af Amer: >60 mL/min (ref >60)
Glucose, Bld: 94 mg/dL (ref 70–99)
Potassium: 3.6 mmol/L (ref 3.5–5.1)
Sodium: 137 mmol/L (ref 135–145)
Total Bilirubin: 1 mg/dL (ref 0.3–1.2)
Total Protein: 5.8 g/dL — ABNORMAL LOW (ref 6.5–8.1)

## 2020-04-03 LAB — PHOSPHORUS: Phosphorus: 2.5 mg/dL (ref 2.5–4.6)

## 2020-04-03 LAB — MAGNESIUM: Magnesium: 2.1 mg/dL (ref 1.7–2.4)

## 2020-04-03 MED ORDER — CALCIUM GLUCONATE-NACL 1-0.675 GM/50ML-% IV SOLN
1.0000 g | Freq: Once | INTRAVENOUS | Status: AC
  Administered 2020-04-03: 1000 mg via INTRAVENOUS
  Filled 2020-04-03: qty 50

## 2020-04-03 NOTE — Plan of Care (Signed)

## 2020-04-03 NOTE — Progress Notes (Signed)
PROGRESS NOTE    Kristopher Evans  NAT:[id number] DOB: Jul 19, 1935 DOA: 04/01/2020 PCP: Patient, No Pcp Per   Brief Narrative:  HPI per Dr. Esaw Grandchild on 04/01/20 Kristopher Evans is a 85 y.o. male with medical history significant of dementia, hypertension, hyperlipidemia, peripheral vascular disease, psoriasis, vitamin D deficiency, vitamin B12 deficiency who presented to the ED from watchman placed today with left hip pain after an unwitnessed fall that was thought to have occurred yesterday.  History is limited by patient's dementia.  He is unable to tell me if he had a fall.  Currently denies pain and does not appear in any discomfort.  He does report his left hip hurts when I press on it.  Per report from EMS, staff at the facility expressed concerned for possible UTI as patient has been more confused than his baseline recently.  At his functional baseline, patient ambulates independently without assist device.  His niece reports that he spends his days walking the halls at his memory care facility.  She states that 99% of the time, patient is pleasant and cooperative, but did require sitter for safety during a previous admission last year.  ED Course: Temp 99.0, hypertensive at 178/84 with otherwise normal vitals.  CBC notable for leukocytosis 13.3k.  BMP unremarkable except for very mild hyperglycemia.  CT head was negative for acute findings, showed old lacunar infarcts, atrophy and chronic small vessel disease.  CT cervical spine was negative for fractures, showed mild degenerative changes.  Left hip x-ray was read as negative for fracture, therefore CT hip was obtained which does show comminuted fracture of the greater trochanter and possibly intertrochanteric as well.  Discussed with orthopedist Dr. Roda Shutters who recommended initially admitting for pain control and attempt PT evaluation prior to proceeding with surgery.  **Interim History  Patient did not provide very much history yesterday but he is  more alert today and weak.  Working with PT yesterday and they are recommending SNF but they did not do any weightbearing as tolerated.  Will need orthopedic evaluation prior to safe discharge disposition given that he possibly will require surgical intervention however Dr. Roda Shutters is reviewed the CT in detail cannot find conclusive evidence of an IT fracture and recommends continue to see the patient mobilize with PT and weightbearing as tolerated.  He recommends obtaining left hip x-ray again in a week.  Assessment & Plan:   Principal Problem:   Closed left hip fracture (HCC) Active Problems:   Dementia (HCC)   HTN (hypertension)   Leucocytosis   Vitamin D deficiency   Vitamin B12 deficiency  Closed Left Hip Fracture  -Present on admission, s/p unwitnessed fall at SNF.   -Xray read as negative for fracture, but due to high clinical suspicion, hip CT was obtained -Hip CT showed "Mildly comminuted fracture of the greater trochanter. Although highly indistinct on CT, there is a suspected nondisplaced intertrochanteric component, with part of this shown on image 38/6. Right groin hernia contains loops of bowel and extends down towards the scrotum. Sigmoid colon diverticulosis. Dependent calcifications in the urinary bladder, probably small bladder stones. Iliac and left common femoral artery atherosclerosis." -Ortho following, Dr. Roda Shutters and appreciate further evaluation and insight and Dr. Roda Shutters is reviewed the CT in detail cannot find conclusive evidence of an IT fracture recommends continuing to see how the patient mobilized with PT and weightbearing as tolerated -Admit for pain control and attempt to work with PT prior to proceeding with Surgery; ET evaluated and  patient up to sitting at the edge of the bed but required assistance to maintain his balance entire time.  His standing and his gait was deferred secondary to the patient's limited participation.  He will need to be weightbearing as tolerated -Pain  control per orders and he is on IV morphine 0.5 mg every 2 hours as needed for severe pain as well as hydrocodone-acetaminophen 1 tab p.o. every 6 as needed for moderate pain -Continue Bowel Regimen with MiraLAX 17 g p.o. daily as needed for moderate constipation along with Senokot 8.6 mg 1 tab p.o. twice daily; patient also has 5 mg of bisacodyl enteric coated tablets daily as needed for moderate constipation -Maintenance IV fluids initiated at 75 MLS per hour -PT evaluation and they evaluated and recommending SNF however standing/gait was deferred secondary to patient limited participation lack of weightbearing however I put in the weightbearing status as tolerated and will need repeat PT evaluation -TOC consulted  Unwitnessed Fall -Patient's CT Head and Cervical Spine showed "No evidence of acute intracranial abnormality. 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 Atrophy with small vessel ischemic changes. No evidence of traumatic injury to the cervical spine. Mild degenerative changes.." -PT/OT to evaluate and Treat -U/A not consistent with infection  -Continue fall precautions  Leukocytosis  -present on admission, WBC 13.3k on admission and is now trending down and is now 11.7 -Per report from EMS, concern for UTI due to recently increased confusion over his baseline.   -U/A showed a clear appearance with negative hemoglobin, negative glucose, 20 ketones, negative leukocytes, negative nitrites, rare bacteria, 0-5 RBC per high-power field, 0-5 WBCs and 0-5 squamous epithelial cells -Chest x-ray on Admission showed "No acute cardiopulmonary abnormality or acute traumatic injury Identified." -Likely leukocytosis is reactive in the setting of his fall and is trending down Continue to monitor for signs and symptoms of infection -Repeat CBC in the a.m. and monitor temperature curve  Essential Hypertension  -Uncontrolled on admission 178/84, suspect due to pain.   -Appears he is not on  antihypertensive medications outpatient.   -PRN oral hydralazine for now will be added 10 mg every 6 hours for systolic blood pressure greater than 160 or diastolic blood pressure greater than 90 -Monitor and consider daily medication if indicated   -Patient's blood pressure is 143/81 now  Elevated CK -Patient CK was 540 and now trending down to 455 and was not repeated today but will repeat again in the morning -Continue IV fluid hydration as above -Repeat CK level in a.m.  Hypocalcemia -Ca2+ was 7.7 and corrected for the 2.9 Albumin is 8.6 -Will Replete with IV Calcium Gluconate 1 gram -Continue to Monitor and Replete as Necessary -Repeat CMP in the AM  Normocytic Anemia -? Dilutional Drop -Hgb/Hct went from 14.2/43.5 -> 12.4/37.9 -> 11.3/34.8 -Check Anemia Panel in the AM -Continue to Monitor for S/Sx of Bleeding; Currently no overt bleeding noted -Repeat CBC in the AM   Dementia -Unknown if any behavioral disturbances.   -Not on medications per med history. -Delirium precautions  Vitamin D Deficiency -Continue daily supplement with cholecalciferol 2000 units p.o. daily  Vitamin B12 Deficiency  -Continue daily supplement with cyanocobalamin 1000 mcg p.o. daily  GOC: DNR, poA  DVT prophylaxis: SCDs Code Status: DO NOT RESUSCITATE  Family Communication: No family present at bedside  Disposition Plan: Pending further improvement and evaluation by Orthopedic Surgery; Needs to Ambulate and have PT with WBAT  Status is: Inpatient  Remains inpatient appropriate because:Unsafe d/c plan, IV  treatments appropriate due to intensity of illness or inability to take PO and Inpatient level of care appropriate due to severity of illness   Dispo: The patient is from: SNF              Anticipated d/c is to: SNF              Anticipated d/c date is: 1-2 Days               Patient currently is not medically stable to d/c.  Consultants:   Orthopedics Dr. Roda Shutters   Procedures:  None  Antimicrobials:  Anti-infectives (From admission, onward)   None      Subjective: Seen and examined at bedside he remains pleasantly demented but he was more awake today and he did answer questions.  No nausea or vomiting.  No other concerns or plans at this time.  Would not stop shaking my hand when I walked in the room.   Objective: Vitals:   04/02/20 1805 04/02/20 2025 04/03/20 0559 04/03/20 1301  BP: (!) 147/84 118/64 (!) 148/68 (!) 143/81  Pulse: 81 82 80 78  Resp: 16 18 16 14   Temp: 97.7 F (36.5 C) 99.3 F (37.4 C) 98.6 F (37 C) 99.6 F (37.6 C)  TempSrc: Oral  Oral   SpO2: 99% 97% 96% 97%  Weight:      Height:        Intake/Output Summary (Last 24 hours) at 04/03/2020 1430 Last data filed at 04/03/2020 1300 Gross per 24 hour  Intake 1571.21 ml  Output 400 ml  Net 1171.21 ml   Filed Weights   04/01/20 2012  Weight: 54.1 kg   Examination: Physical Exam:  Constitutional: Patient is a thin elderly demented Caucasian male currently in no acute distress and he appears calm is more awake today and does interact. Eyes: Lids and conjunctivae normal, sclerae anicteric  ENMT: External Ears, Nose appear normal. Grossly normal hearing.  Neck: Appears normal, supple, no cervical masses, normal ROM, no appreciable thyromegaly; no JVD Respiratory: Diminished to auscultation bilaterally, no wheezing, rales, rhonchi or crackles. Normal respiratory effort and patient is not tachypenic. No accessory muscle use.  Unlabored breathing Cardiovascular: RRR, no murmurs / rubs / gallops. S1 and S2 auscultated. No extremity edema. 2+ pedal pulses. No carotid bruits.  Abdomen: Soft, non-tender, non-distended. Bowel sounds positive.  GU: Deferred. Musculoskeletal: No clubbing / cyanosis of digits/nails. No joint deformity upper and lower extremities.  Skin: No rashes, lesions, ulcers. No induration; Warm and dry.  Neurologic: CN 2-12 grossly intact with no focal deficits.Romberg  sign and cerebellar reflexes not assessed.  Psychiatric: Impaired judgment and insight.  He is awake and alert but not oriented x 3. Normal mood and appropriate affect.   Data Reviewed: I have personally reviewed following labs and imaging studies  CBC: Recent Labs  Lab 04/01/20 0844 04/02/20 0246 04/03/20 0326  WBC 13.3* 12.8* 11.7*  NEUTROABS 9.0*  --  6.8  HGB 14.2 12.4* 11.3*  HCT 43.5 37.9* 34.8*  MCV 94.2 94.0 96.1  PLT 262 222 207   Basic Metabolic Panel: Recent Labs  Lab 04/01/20 0844 04/02/20 0246 04/03/20 0326  NA 136 136 137  K 3.7 3.6 3.6  CL 99 104 109  CO2 27 26 27   GLUCOSE 107* 92 94  BUN 21 21 22   CREATININE 0.98 1.01 0.99  CALCIUM 8.8* 8.2* 7.7*  MG  --   --  2.1  PHOS  --   --  2.5   GFR: Estimated Creatinine Clearance: 44 mL/min (by C-G formula based on SCr of 0.99 mg/dL). Liver Function Tests: Recent Labs  Lab 04/03/20 0326  AST 25  ALT 16  ALKPHOS 73  BILITOT 1.0  PROT 5.8*  ALBUMIN 2.9*   No results for input(s): LIPASE, AMYLASE in the last 168 hours. No results for input(s): AMMONIA in the last 168 hours. Coagulation Profile: Recent Labs  Lab 04/01/20 0844  INR 1.0   Cardiac Enzymes: Recent Labs  Lab 04/01/20 0844 04/02/20 0900  CKTOTAL 540* 455*   BNP (last 3 results) No results for input(s): PROBNP in the last 8760 hours. HbA1C: No results for input(s): HGBA1C in the last 72 hours. CBG: No results for input(s): GLUCAP in the last 168 hours. Lipid Profile: No results for input(s): CHOL, HDL, LDLCALC, TRIG, CHOLHDL, LDLDIRECT in the last 72 hours. Thyroid Function Tests: No results for input(s): TSH, T4TOTAL, FREET4, T3FREE, THYROIDAB in the last 72 hours. Anemia Panel: No results for input(s): VITAMINB12, FOLATE, FERRITIN, TIBC, IRON, RETICCTPCT in the last 72 hours. Sepsis Labs: No results for input(s): PROCALCITON, LATICACIDVEN in the last 168 hours.  Recent Results (from the past 240 hour(s))  SARS Coronavirus 2  by RT PCR (hospital order, performed in [facility] hospital lab) Nasopharyngeal Nasopharyngeal Swab     Status: None   Collection Time: 04/01/20  8:44 AM   Specimen: Nasopharyngeal Swab  Result Value Ref Range Status   SARS Coronavirus 2 NEGATIVE NEGATIVE Final    Comment: (NOTE) SARS-CoV-2 target nucleic acids are NOT DETECTED.  The SARS-CoV-2 RNA is generally detectable in upper and lower respiratory specimens during the acute phase of infection. The lowest concentration of SARS-CoV-2 viral copies this assay can detect is 250 copies / mL. A negative result does not preclude SARS-CoV-2 infection and should not be used as the sole basis for treatment or other patient management decisions.  A negative result may occur with improper specimen collection / handling, submission of specimen other than nasopharyngeal swab, presence of viral mutation(s) within the areas targeted by this assay, and inadequate number of viral copies (<250 copies / mL). A negative result must be combined with clinical observations, patient history, and epidemiological information.  Fact Sheet for Patients:   BoilerBrush.com.cy  Fact Sheet for Healthcare Providers: https://pope.com/  This test is not yet approved or  cleared by the [place] FDA and has been authorized for detection and/or diagnosis of SARS-CoV-2 by FDA under an Emergency Use Authorization (EUA).  This EUA will remain in effect (meaning this test can be used) for the duration of the COVID-19 declaration under Section 564(b)(1) of the Act, 21 U.S.C. section 360bbb-3(b)(1), unless the authorization is terminated or revoked sooner.  Performed at [facility], [address] [place], [place] [address]     RN Pressure Injury Documentation: Pressure Injury 09/03/19 Buttocks Medial Stage I -  Intact skin with non-blanchable redness of a localized area usually over a bony  prominence. (Active)  09/03/19   Location: Buttocks  Location Orientation: Medial  Staging: Stage I -  Intact skin with non-blanchable redness of a localized area usually over a bony prominence.  Wound Description (Comments):   Present on Admission:      Estimated body mass index is 19.25 kg/m as calculated from the following:   Height as of this encounter: 5\' 6"  (1.676 m).   Weight as of this encounter: 54.1 kg.  Malnutrition Type:  Nutrition Problem: Increased nutrient needs  Etiology: post-op healing   Malnutrition Characteristics:  Signs/Symptoms: estimated needs   Nutrition Interventions:  Interventions: Refer to RD note for recommendations   Radiology Studies: No results found. Scheduled Meds: . cholecalciferol  2,000 Units Oral Daily  . senna  1 tablet Oral BID  . traZODone  50 mg Oral QHS  . vitamin B-12  1,000 mcg Oral Daily   Continuous Infusions: . sodium chloride 75 mL/hr at 04/03/20 0556    LOS: 2 days   Kerney Elbe, DO [facility] PAGER is on AMION  If 7PM-7AM, please contact night-coverage [email or web address]

## 2020-04-04 LAB — CBC WITH DIFFERENTIAL/PLATELET
Abs Immature Granulocytes: 0.06 K/uL (ref 0.00–0.07)
Basophils Absolute: 0.1 K/uL (ref 0.0–0.1)
Basophils Relative: 0 %
Eosinophils Absolute: 0.2 K/uL (ref 0.0–0.5)
Eosinophils Relative: 2 %
HCT: 34.3 % — ABNORMAL LOW (ref 39.0–52.0)
Hemoglobin: 11.5 g/dL — ABNORMAL LOW (ref 13.0–17.0)
Immature Granulocytes: 0 %
Lymphocytes Relative: 26 %
Lymphs Abs: 3.5 K/uL (ref 0.7–4.0)
MCH: 32 pg (ref 26.0–34.0)
MCHC: 33.5 g/dL (ref 30.0–36.0)
MCV: 95.5 fL (ref 80.0–100.0)
Monocytes Absolute: 1 K/uL (ref 0.1–1.0)
Monocytes Relative: 7 %
Neutro Abs: 8.8 K/uL — ABNORMAL HIGH (ref 1.7–7.7)
Neutrophils Relative %: 65 %
Platelets: 216 K/uL (ref 150–400)
RBC: 3.59 MIL/uL — ABNORMAL LOW (ref 4.22–5.81)
RDW: 14.6 % (ref 11.5–15.5)
WBC: 13.7 K/uL — ABNORMAL HIGH (ref 4.0–10.5)
nRBC: 0 % (ref 0.0–0.2)

## 2020-04-04 LAB — PHOSPHORUS: Phosphorus: 2.7 mg/dL (ref 2.5–4.6)

## 2020-04-04 LAB — COMPREHENSIVE METABOLIC PANEL
ALT: 18 U/L (ref 0–44)
AST: 26 U/L (ref 15–41)
Albumin: 3 g/dL — ABNORMAL LOW (ref 3.5–5.0)
Alkaline Phosphatase: 73 U/L (ref 38–126)
Anion gap: 7 (ref 5–15)
BUN: 19 mg/dL (ref 8–23)
CO2: 26 mmol/L (ref 22–32)
Calcium: 8 mg/dL — ABNORMAL LOW (ref 8.9–10.3)
Chloride: 108 mmol/L (ref 98–111)
Creatinine, Ser: 1.08 mg/dL (ref 0.61–1.24)
GFR calc Af Amer: >60 mL/min (ref >60)
GFR calc non Af Amer: >60 mL/min (ref >60)
Glucose, Bld: 108 mg/dL — ABNORMAL HIGH (ref 70–99)
Potassium: 3.8 mmol/L (ref 3.5–5.1)
Sodium: 141 mmol/L (ref 135–145)
Total Bilirubin: 1 mg/dL (ref 0.3–1.2)
Total Protein: 6 g/dL — ABNORMAL LOW (ref 6.5–8.1)

## 2020-04-04 LAB — SARS CORONAVIRUS 2 BY RT PCR (HOSPITAL ORDER, PERFORMED IN ~~LOC~~ HOSPITAL LAB): SARS Coronavirus 2: NEGATIVE

## 2020-04-04 LAB — CK: Total CK: 118 U/L (ref 49–397)

## 2020-04-04 LAB — MAGNESIUM: Magnesium: 2 mg/dL (ref 1.7–2.4)

## 2020-04-04 NOTE — TOC Initial Note (Signed)
Transition of Care [facility]) - Initial/Assessment Note    Patient Details  Name: Kristopher Evans MRN: [id number] Date of Birth: 1935-04-20  Transition of Care [facility]) CM/SW Contact:    Lia Hopping, [place] Phone Number: 04/04/2020, 9:42 AM  Clinical Narrative:    Patient admitted after a unwitnessed fall at SNF. CT of hip was obtained. There is a suspected nondisplaced intertrochanteric component. Patient admitted for pain control.                Patient alert to self only. CSW reached out to the patient niece to discuss patient discharge plan to SNF for rehab. Patient niece agrees the patient will benefit from rehab before returning to memory care where he was ambulatory without assistance. Per med tech. Olivia Mackie, the patient did use an ambulatory device and "walked all day." Patient receives assistance with bathing and dressing. Patient niece report the patient went to [facility] about six months ago and rehab well. She would like for the patient to return if a bed is available.  FL2 completed.  HTA Authorization initiated.   Patient has been vaccinated ( January and February)  Expected Discharge Plan: [place] Barriers to Discharge: Continued Medical Work up, Ship broker   Patient Goals and CMS Choice   CMS Medicare.gov Compare Post Acute Care list provided to:: Patient Choice offered to / list presented to : Patient  Expected Discharge Plan and Services Expected Discharge Plan: [place] In-house Referral: Clinical Social Work Discharge Planning Services: CM Consult Post Acute Care Choice: [place] arrangements for the past 2 months: [place] (Memory Care)                 DME Arranged: N/A DME Agency: NA       HH Arranged: NA [place] Agency: NA        Prior Living Arrangements/Services Living arrangements for the past 2 months: [place] (Memory Care) Lives with::  Facility Resident Patient language and need for interpreter reviewed:: No Do you feel safe going back to the place where you live?: Yes      Need for Family Participation in Patient Care: Yes (Comment) Care giver support system in place?: Yes (comment)   Criminal Activity/Legal Involvement Pertinent to Current Situation/Hospitalization: No - Comment as needed  Activities of Daily Living Home Assistive Devices/Equipment: Blood pressure cuff, Hospital bed, Grab bars around toilet, Grab bars in shower, Hand-held shower hose, Scales, Wheelchair ADL Screening (condition at time of admission) Patient's cognitive ability adequate to safely complete daily activities?: No Is the patient deaf or have difficulty hearing?: Yes Does the patient have difficulty seeing, even when wearing glasses/contacts?: No Does the patient have difficulty concentrating, remembering, or making decisions?: Yes Patient able to express need for assistance with ADLs?: No Does the patient have difficulty dressing or bathing?: Yes Independently performs ADLs?: No Communication: Independent Dressing (OT): Needs assistance Is this a change from baseline?: Pre-admission baseline Grooming: Needs assistance Is this a change from baseline?: Pre-admission baseline Feeding: Needs assistance Is this a change from baseline?: Pre-admission baseline Bathing: Needs assistance Is this a change from baseline?: Pre-admission baseline Toileting: Dependent Is this a change from baseline?: Change from baseline, expected to last >3days In/Out Bed: Dependent Is this a change from baseline?: Change from baseline, expected to last >3 days Walks in Home: Dependent Is this a change from baseline?: Change from baseline, expected to last >3 days Does the patient have difficulty  walking or climbing stairs?: Yes Weakness of Legs: Both Weakness of Arms/Hands: None  Permission Sought/Granted Permission sought to share information with : Family  Supports Permission granted to share information with : Yes, Verbal Permission Granted  Share Information with NAME: Graylon Good  Permission granted to share info w AGENCY: [place]/ SNF's in the area  Permission granted to share info w Relationship: niece  Permission granted to share info w Contact Information: [phone]  Emotional Assessment Appearance:: Appears stated age   Affect (typically observed): Unable to Assess Orientation: : Oriented to Self Alcohol / Substance Use: Not Applicable Psych Involvement: No (comment)  Admission diagnosis:  Closed left hip fracture (HCC) [S72.002A] Closed fracture of left hip, initial encounter [facility]) [S72.002A] Patient Active Problem List   Diagnosis Date Noted   Closed left hip fracture (HCC) 04/01/2020   Vitamin D deficiency 04/01/2020   Vitamin B12 deficiency 04/01/2020   Acute respiratory failure with hypoxia (HCC) 09/05/2019   Hypophosphatemia 09/05/2019   Pressure injury of skin 09/04/2019   Hypokalemia 09/04/2019   Normocytic anemia 09/04/2019   Neck abscess 09/02/2019   Dementia (HCC) 09/02/2019   HTN (hypertension) 09/02/2019   Leucocytosis 09/02/2019   PCP:  Patient, No Pcp Per Pharmacy:  No Pharmacies Listed    Social Determinants of Health (SDOH) Interventions    Readmission Risk Interventions No flowsheet data found.

## 2020-04-04 NOTE — TOC Progression Note (Signed)
Transition of Care [facility]) - Progression Note    Patient Details  Name: Kristopher Evans MRN: [id number] Date of Birth: 06-09-1935  Transition of Care [facility]) CM/SW Contact  Clearance Coots, LCSW Phone Number: 04/04/2020, 12:40 PM  Clinical Narrative:    [facility] accepted.  HTA auth pending.  Patient will need an updated covid test prior to discharge. Physician notified.   Expected Discharge Plan: Skilled Nursing Facility Barriers to Discharge: Continued Medical Work up, English as a second language teacher  Expected Discharge Plan and Services Expected Discharge Plan: Skilled Nursing Facility In-house Referral: Clinical Social Work Discharge Planning Services: CM Consult Post Acute Care Choice: Skilled Nursing Facility Living arrangements for the past 2 months: Assisted Living Facility (Memory Care)                 DME Arranged: N/A DME Agency: NA       HH Arranged: NA HH Agency: NA         Social Determinants of Health (SDOH) Interventions    Readmission Risk Interventions No flowsheet data found.

## 2020-04-04 NOTE — Care Management Important Message (Signed)
Important Message  Patient Details IM Letter given to [name] SW Case Manager to present to the Patient Name: BRIDGER PIZZI MRN: [id number] Date of Birth: February 06, 1935   Medicare Important Message Given:  Yes     Caren Macadam 04/04/2020, 11:34 AM

## 2020-04-04 NOTE — Progress Notes (Signed)
PHYSICAL THERAPY  Pt unable to participate due to inability to arouse and follow directions.  Pt was evaluated on 6/19 with rec for SNF.  Pt will need a higher level of care vs prior ALF.  Felecia Shelling  PTA Acute  Rehabilitation Services Pager      [phone] Office      [phone]

## 2020-04-04 NOTE — Progress Notes (Signed)
PROGRESS NOTE    Kristopher Evans  OIN:[id number] DOB: Dec 17, 1934 DOA: 04/01/2020 PCP: Patient, No Pcp Per   Brief Narrative:  HPI per Dr. Nicole Kindred on 04/01/20 Kristopher Evans is a 85 y.o. male with medical history significant of dementia, hypertension, hyperlipidemia, peripheral vascular disease, psoriasis, vitamin D deficiency, vitamin B12 deficiency who presented to the ED from [place] placed today with left hip pain after an unwitnessed fall that was thought to have occurred yesterday.  History is limited by patient's dementia.  He is unable to tell me if he had a fall.  Currently denies pain and does not appear in any discomfort.  He does report his left hip hurts when I press on it.  Per report from EMS, staff at the facility expressed concerned for possible UTI as patient has been more confused than his baseline recently.  At his functional baseline, patient ambulates independently without assist device.  His niece reports that he spends his days walking the halls at his memory care facility.  She states that 99% of the time, patient is pleasant and cooperative, but did require sitter for safety during a previous admission last year.  ED Course: Temp 99.0, hypertensive at 178/84 with otherwise normal vitals.  CBC notable for leukocytosis 13.3k.  BMP unremarkable except for very mild hyperglycemia.  CT head was negative for acute findings, showed old lacunar infarcts, atrophy and chronic small vessel disease.  CT cervical spine was negative for fractures, showed mild degenerative changes.  Left hip x-ray was read as negative for fracture, therefore CT hip was obtained which does show comminuted fracture of the greater trochanter and possibly intertrochanteric as well.  Discussed with orthopedist Dr. Erlinda Hong who recommended initially admitting for pain control and attempt PT evaluation prior to proceeding with surgery.  **Interim History  Patient did not provide very much history again today and he  is more interactive yesterday.  Working with PT the day before yesterday and they are recommending SNF but they did not do any weightbearing as tolerated.  Will need orthopedic evaluation prior to safe discharge disposition given that he possibly will require surgical intervention however Dr. Erlinda Hong is reviewed the CT in detail cannot find conclusive evidence of an IT fracture and recommends continue to see the patient mobilize with PT and weightbearing as tolerated.  He recommends obtaining left hip x-ray again in a week.  PT to reevaluate for weightbearing as tolerated.  Social work assisting with placement and he has been accepted to a facility but insurance authorization is pending and Covid testing is pending.  We will follow up on PT recommendations for his weightbearing and orthopedic recommendations.  Assessment & Plan:   Principal Problem:   Closed left hip fracture (HCC) Active Problems:   Dementia ([place])   HTN (hypertension)   Leucocytosis   Vitamin D deficiency   Vitamin B12 deficiency  Closed Left Hip Fracture  -Present on admission, s/p unwitnessed fall at SNF.   -Xray read as negative for fracture, but due to high clinical suspicion, hip CT was obtained -Hip CT showed "Mildly comminuted fracture of the greater trochanter. Although highly indistinct on CT, there is a suspected nondisplaced intertrochanteric component, with part of this shown on image 38/6. Right groin hernia contains loops of bowel and extends down towards the scrotum. Sigmoid colon diverticulosis. Dependent calcifications in the urinary bladder, probably small bladder stones. Iliac and left common femoral artery atherosclerosis." -Ortho following, Dr. Erlinda Hong and appreciate further evaluation and insight and  Dr. Roda Shutters is reviewed the CT in detail cannot find conclusive evidence of an IT fracture recommends continuing to see how the patient mobilized with PT and weightbearing as tolerated -Admit for pain control and attempt to  work with PT prior to proceeding with Surgery; ET evaluated and patient up to sitting at the edge of the bed but required assistance to maintain his balance entire time.  His standing and his gait was deferred secondary to the patient's limited participation.  He will need to be weightbearing as tolerated -Pain control per orders and he is on IV morphine 0.5 mg every 2 hours as needed for severe pain as well as hydrocodone-acetaminophen 1 tab p.o. every 6 as needed for moderate pain -Continue Bowel Regimen with MiraLAX 17 g p.o. daily as needed for moderate constipation along with Senokot 8.6 mg 1 tab p.o. twice daily; patient also has 5 mg of bisacodyl enteric coated tablets daily as needed for moderate constipation -Maintenance IV fluids initiated at 75 MLS per hour now stopped today  -PT evaluation and they evaluated and recommending SNF however standing/gait was deferred secondary to patient limited participation lack of weightbearing however I put in the weightbearing status as tolerated and will need repeat PT evaluation -TOC consulted and found the patient placement insurance authorization is pending  Unwitnessed Fall -Patient's CT Head and Cervical Spine showed "No evidence of acute intracranial abnormality. 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 Atrophy with small vessel ischemic changes. No evidence of traumatic injury to the cervical spine. Mild degenerative changes.." -PT/OT to evaluate and Treat and recommending SNF -U/A not consistent with infection  -Continue fall precautions  Leukocytosis  -present on admission, WBC 13.3k on admission and is was trending down to 11.7 but acutely went up and is now 13.7 today -Per report from EMS, concern for UTI due to recently increased confusion over his baseline.   -U/A showed a clear appearance with negative hemoglobin, negative glucose, 20 ketones, negative leukocytes, negative nitrites, rare bacteria, 0-5 RBC per high-power field, 0-5  WBCs and 0-5 squamous epithelial cells -Chest x-ray on Admission showed "No acute cardiopulmonary abnormality or acute traumatic injury Identified." -Likely leukocytosis is reactive in the setting of his fall and was trending down -Continue to monitor for signs and symptoms of infection; Currently none -Repeat CBC in the a.m. and monitor temperature curve  Essential Hypertension  -Uncontrolled on admission 178/84, suspect due to pain.   -Appears he is not on antihypertensive medications outpatient.   -PRN oral hydralazine for now will be added 10 mg every 6 hours for systolic blood pressure greater than 160 or diastolic blood pressure greater than 90 -Monitor and consider daily medication if indicated   -Patient's blood pressure is 147/83 now  Elevated CK -Patient CK was 540 and now trending down to 118 and normalized -Continued IV fluid hydration as above but now stopped  -Repeat CK level in a.m.  Hypocalcemia -Ca2+ was 7.7 and corrected for the 2.9 Albumin is 8.6; now Ca2+ is 8.0 and corrected to 8.8 -Replete with IV Calcium Gluconate 1 gram yesterday -Continue to Monitor and Replete as Necessary -Repeat CMP in the AM  Normocytic Anemia -? Dilutional Drop -Hgb/Hct went from 14.2/43.5 -> 12.4/37.9 -> 11.3/34.8 -> 11.5/34.3 -Check Anemia Panel in the AM -Continue to Monitor for S/Sx of Bleeding; Currently no overt bleeding noted -Repeat CBC in the AM   Dementia -Unknown if any behavioral disturbances.   -Not on medications per med history. Continues to [place] -Delirium precautions  Vitamin D Deficiency -Continue daily supplement with cholecalciferol 2000 units p.o. daily  Vitamin B12 Deficiency  -Continue daily supplement with cyanocobalamin 1000 mcg p.o. daily  GOC: DNR, poA  DVT prophylaxis: SCDs Code Status: DO NOT RESUSCITATE  Family Communication: No family present at bedside  Disposition Plan: Pending further improvement and evaluation by Orthopedic  Surgery; Needs to Ambulate and have PT with [facility]  Status is: Inpatient  Remains inpatient appropriate because:Unsafe d/c plan, IV treatments appropriate due to intensity of illness or inability to take PO and Inpatient level of care appropriate due to severity of illness   Dispo: The patient is from: SNF              Anticipated d/c is to: SNF              Anticipated d/c date is: 1-2 Days               Patient currently is not medically stable to d/c.  Consultants:   Orthopedics Dr. Roda ShuttersXu   Procedures: None  Antimicrobials:  Anti-infectives (From admission, onward)   None      Subjective: Seen and examined at bedside and he was not as responsive as he was yesterday.  Getting cleaned up by the nurse tech.  No nausea or vomiting.  Unable to provide a subjective history due to his advanced dementia.  Denies any complaints.  No other concerns at this time.  Objective: Vitals:   04/03/20 0559 04/03/20 1301 04/03/20 2139 04/04/20 0533  BP: (!) 148/68 (!) 143/81 (!) 144/87 (!) 147/83  Pulse: 80 78 82 80  Resp: 16 14 15 16   Temp: 98.6 F (37 C) 99.6 F (37.6 C) 98.9 F (37.2 C) 98.6 F (37 C)  TempSrc: Oral  Oral Axillary  SpO2: 96% 97% 98% 99%  Weight:      Height:        Intake/Output Summary (Last 24 hours) at 04/04/2020 1409 Last data filed at 04/04/2020 [id number] Gross per 24 hour  Intake 1515.68 ml  Output 650 ml  Net 865.68 ml   Filed Weights   04/01/20 2012  Weight: 54.1 kg   Examination: Physical Exam:  Constitutional: Patient is a thin elderly demented Caucasian male currently in no acute distress and he is not as alert as he was yesterday but he does interact. Eyes: Lids and conjunctivae normal, sclerae anicteric  ENMT: External Ears, Nose appear normal. Grossly normal hearing.  Neck: Appears normal, supple, no cervical masses, normal ROM, no appreciable thyromegaly; no JVD Respiratory: Diminished to auscultation bilaterally, no wheezing, rales, rhonchi or  crackles. Normal respiratory effort and patient is not tachypenic. No accessory muscle use.  Unlabored breathing Cardiovascular: RRR, no murmurs / rubs / gallops. S1 and S2 auscultated. No extremity edema. 2+ pedal pulses. No carotid bruits.  Abdomen: Soft, non-tender, non-distended. Bowel sounds positive.  GU: Deferred. Musculoskeletal: No clubbing / cyanosis of digits/nails. No joint deformity upper and lower extremities.  Skin: No rashes, lesions, ulcers on limited skin evaluation. No induration; Warm and dry.  Neurologic: CN 2-12 grossly intact with no focal deficits but is a little bit drowsy today.  Romberg sign cerebellar reflexes not assessed.  Psychiatric: Impaired judgment and insight.  Not alert and oriented x 3.  He is a little somnolent and drowsy today.  Data Reviewed: I have personally reviewed following labs and imaging studies  CBC: Recent Labs  Lab 04/01/20 0844 04/02/20 0246 04/03/20 0326 04/04/20 0327  WBC 13.3* 12.8* 11.7* 13.7*  NEUTROABS 9.0*  --  6.8 8.8*  HGB 14.2 12.4* 11.3* 11.5*  HCT 43.5 37.9* 34.8* 34.3*  MCV 94.2 94.0 96.1 95.5  PLT 262 222 207 216   Basic Metabolic Panel: Recent Labs  Lab 04/01/20 0844 04/02/20 0246 04/03/20 0326 04/04/20 0327  NA 136 136 137 141  K 3.7 3.6 3.6 3.8  CL 99 104 109 108  CO2 27 26 27 26   GLUCOSE 107* 92 94 108*  BUN 21 21 22 19   CREATININE 0.98 1.01 0.99 1.08  CALCIUM 8.8* 8.2* 7.7* 8.0*  MG  --   --  2.1 2.0  PHOS  --   --  2.5 2.7   GFR: Estimated Creatinine Clearance: 40.4 mL/min (by C-G formula based on SCr of 1.08 mg/dL). Liver Function Tests: Recent Labs  Lab 04/03/20 0326 04/04/20 0327  AST 25 26  ALT 16 18  ALKPHOS 73 73  BILITOT 1.0 1.0  PROT 5.8* 6.0*  ALBUMIN 2.9* 3.0*   No results for input(s): LIPASE, AMYLASE in the last 168 hours. No results for input(s): AMMONIA in the last 168 hours. Coagulation Profile: Recent Labs  Lab 04/01/20 0844  INR 1.0   Cardiac Enzymes: Recent Labs   Lab 04/01/20 0844 04/02/20 0900 04/04/20 0327  CKTOTAL 540* 455* 118   BNP (last 3 results) No results for input(s): PROBNP in the last 8760 hours. HbA1C: No results for input(s): HGBA1C in the last 72 hours. CBG: No results for input(s): GLUCAP in the last 168 hours. Lipid Profile: No results for input(s): CHOL, HDL, LDLCALC, TRIG, CHOLHDL, LDLDIRECT in the last 72 hours. Thyroid Function Tests: No results for input(s): TSH, T4TOTAL, FREET4, T3FREE, THYROIDAB in the last 72 hours. Anemia Panel: No results for input(s): VITAMINB12, FOLATE, FERRITIN, TIBC, IRON, RETICCTPCT in the last 72 hours. Sepsis Labs: No results for input(s): PROCALCITON, LATICACIDVEN in the last 168 hours.  Recent Results (from the past 240 hour(s))  SARS Coronavirus 2 by RT PCR (hospital order, performed in [facility] hospital lab) Nasopharyngeal Nasopharyngeal Swab     Status: None   Collection Time: 04/01/20  8:44 AM   Specimen: Nasopharyngeal Swab  Result Value Ref Range Status   SARS Coronavirus 2 NEGATIVE NEGATIVE Final    Comment: (NOTE) SARS-CoV-2 target nucleic acids are NOT DETECTED.  The SARS-CoV-2 RNA is generally detectable in upper and lower respiratory specimens during the acute phase of infection. The lowest concentration of SARS-CoV-2 viral copies this assay can detect is 250 copies / mL. A negative result does not preclude SARS-CoV-2 infection and should not be used as the sole basis for treatment or other patient management decisions.  A negative result may occur with improper specimen collection / handling, submission of specimen other than nasopharyngeal swab, presence of viral mutation(s) within the areas targeted by this assay, and inadequate number of viral copies (<250 copies / mL). A negative result must be combined with clinical observations, patient history, and epidemiological information.  Fact Sheet for Patients:   [facility]  Fact  Sheet for Healthcare Providers: 04/03/20  This test is not yet approved or  cleared by the [employer] FDA and has been authorized for detection and/or diagnosis of SARS-CoV-2 by FDA under an Emergency Use Authorization (EUA).  This EUA will remain in effect (meaning this test can be used) for the duration of the COVID-19 declaration under Section 564(b)(1) of the Act, 21 U.S.C. section 360bbb-3(b)(1), unless the authorization is terminated or revoked  sooner.  Performed at [facility], [address], [place], [place] [address]     RN Pressure Injury Documentation: Pressure Injury 09/03/19 Buttocks Medial Stage I -  Intact skin with non-blanchable redness of a localized area usually over a bony prominence. (Active)  09/03/19   Location: Buttocks  Location Orientation: Medial  Staging: Stage I -  Intact skin with non-blanchable redness of a localized area usually over a bony prominence.  Wound Description (Comments):   Present on Admission:      Estimated body mass index is 19.25 kg/m as calculated from the following:   Height as of this encounter: 5\' 6"  (1.676 m).   Weight as of this encounter: 54.1 kg.  Malnutrition Type:  Nutrition Problem: Increased nutrient needs Etiology: post-op healing   Malnutrition Characteristics:  Signs/Symptoms: estimated needs   Nutrition Interventions:  Interventions: Refer to RD note for recommendations   Radiology Studies: No results found. Scheduled Meds: . cholecalciferol  2,000 Units Oral Daily  . senna  1 tablet Oral BID  . traZODone  50 mg Oral QHS  . vitamin B-12  1,000 mcg Oral Daily   Continuous Infusions: . sodium chloride 75 mL/hr at 04/04/20 1008    LOS: 3 days   04/06/20, DO [facility] PAGER is on AMION  If 7PM-7AM, please contact night-coverage [email or web address]

## 2020-04-04 NOTE — [place] FL2 (Signed)
[place] MEDICAID FL2 LEVEL OF CARE SCREENING TOOL     IDENTIFICATION  Patient Name: Kristopher Evans Birthdate: [birth year] Sex: male Admission Date (Current Location): 04/01/2020  [facility] and [place] Number:  Producer, television/film/video and Address:  [facility],  [address] [place], [place] [address]      Provider Number: [id number]  Attending Physician Name and Address:  Merlene Laughter, DO  Relative Name and Phone Number:  Graylon Good Niece [phone]    Current Level of Care: Hospital Recommended Level of Care: Skilled Nursing Facility Prior Approval Number:    Date Approved/Denied:   PASRR Number: [id number]  Discharge Plan: SNF    Current Diagnoses: Patient Active Problem List   Diagnosis Date Noted  . Closed left hip fracture (HCC) 04/01/2020  . Vitamin D deficiency 04/01/2020  . Vitamin B12 deficiency 04/01/2020  . Acute respiratory failure with hypoxia (HCC) 09/05/2019  . Hypophosphatemia 09/05/2019  . Pressure injury of skin 09/04/2019  . Hypokalemia 09/04/2019  . Normocytic anemia 09/04/2019  . Neck abscess 09/02/2019  . Dementia (HCC) 09/02/2019  . HTN (hypertension) 09/02/2019  . Leucocytosis 09/02/2019    Orientation RESPIRATION BLADDER Height & Weight     Self  Normal Incontinent Weight: 119 lb 4.3 oz (54.1 kg) Height:  5\' 6"  (167.6 cm)  BEHAVIORAL SYMPTOMS/MOOD NEUROLOGICAL BOWEL NUTRITION STATUS      Incontinent Diet (Heart Healthy)  AMBULATORY STATUS COMMUNICATION OF NEEDS Skin   Extensive Assist Verbally PU Stage and Appropriate Care PU Stage 1 Dressing:  (Pressure Wound , Medial Stage I. Buttocks. Incontinent.)                     Personal Care Assistance Level of Assistance  Bathing, Feeding, Dressing Bathing Assistance: Maximum assistance Feeding assistance: Independent Dressing Assistance: Maximum assistance     Functional Limitations Info  Sight, Hearing, Speech Sight Info: Impaired Hearing Info:  Adequate Speech Info: Adequate    SPECIAL CARE FACTORS FREQUENCY  PT (By licensed PT), OT (By licensed OT)     PT Frequency: 5x/week OT Frequency: 5x/week            Contractures Contractures Info: Not present    Additional Factors Info  Code Status, Allergies, Psychotropic Code Status Info: DNR Allergies Info: Allergies: Penicillins Psychotropic Info: Trazadone         Current Medications (04/04/2020):  This is the current hospital active medication list Current Facility-Administered Medications  Medication Dose Route Frequency Provider Last Rate Last Admin  . 0.9 %  sodium chloride infusion   Intravenous Continuous 04/06/2020, DO 75 mL/hr at 04/03/20 2011 New Bag at 04/03/20 2011  . bisacodyl (DULCOLAX) EC tablet 5 mg  5 mg Oral Daily PRN 2012 A, DO      . cholecalciferol (VITAMIN D3) tablet 2,000 Units  2,000 Units Oral Daily Esaw Grandchild A, DO   2,000 Units at 04/04/20 0920  . hydrALAZINE (APRESOLINE) injection 10 mg  10 mg Intravenous Q6H PRN 04/06/20 Latif, DO   10 mg at 04/02/20 1636  . hydrALAZINE (APRESOLINE) tablet 10 mg  10 mg Oral Q6H PRN 04/04/20 Latif, DO      . HYDROcodone-acetaminophen (NORCO/VICODIN) 5-325 MG per tablet 1 tablet  1 tablet Oral Q6H PRN Marguerita Merles A, DO   1 tablet at 04/03/20 0913  . liver oil-zinc oxide (DESITIN) 40 % ointment 1 application  1 application Topical BID PRN 04/05/20 A, DO      .  morphine 2 MG/ML injection 0.5 mg  0.5 mg Intravenous Q2H PRN Rishon Thilges Kindred A, DO   0.5 mg at 04/01/20 1229  . polyethylene glycol (MIRALAX / GLYCOLAX) packet 17 g  17 g Oral Daily PRN Ilma Achee Kindred A, DO      . senna (SENOKOT) tablet 8.6 mg  1 tablet Oral BID Latara Micheli Kindred A, DO   8.6 mg at 04/04/20 0920  . traZODone (DESYREL) tablet 50 mg  50 mg Oral QHS  [place]ohick Kindred A, DO   50 mg at 04/03/20 2214  . vitamin B-12 (CYANOCOBALAMIN) tablet 1,000 mcg  1,000 mcg Oral Daily Diannia Hogenson Kindred A, DO   1,000 mcg at  04/04/20 0920     Discharge Medications: Please see discharge summary for a list of discharge medications.  Relevant Imaging Results:  Relevant Lab Results:   Additional Information SSN [id number]  Lia Hopping, LCSW

## 2020-04-05 ENCOUNTER — Inpatient Hospital Stay (HOSPITAL_COMMUNITY): Payer: PPO

## 2020-04-05 DIAGNOSIS — G9340 Encephalopathy, unspecified: Secondary | ICD-10-CM

## 2020-04-05 DIAGNOSIS — G309 Alzheimer's disease, unspecified: Secondary | ICD-10-CM

## 2020-04-05 DIAGNOSIS — R400 Somnolence: Secondary | ICD-10-CM

## 2020-04-05 DIAGNOSIS — F0280 Dementia in other diseases classified elsewhere without behavioral disturbance: Secondary | ICD-10-CM

## 2020-04-05 LAB — CBC WITH DIFFERENTIAL/PLATELET
Abs Immature Granulocytes: 0.05 10*3/uL (ref 0.00–0.07)
Basophils Absolute: 0.1 10*3/uL (ref 0.0–0.1)
Basophils Relative: 1 %
Eosinophils Absolute: 0.3 10*3/uL (ref 0.0–0.5)
Eosinophils Relative: 2 %
HCT: 39.5 % (ref 39.0–52.0)
Hemoglobin: 13 g/dL (ref 13.0–17.0)
Immature Granulocytes: 0 %
Lymphocytes Relative: 29 %
Lymphs Abs: 3.5 10*3/uL (ref 0.7–4.0)
MCH: 31.3 pg (ref 26.0–34.0)
MCHC: 32.9 g/dL (ref 30.0–36.0)
MCV: 95 fL (ref 80.0–100.0)
Monocytes Absolute: 1.1 10*3/uL — ABNORMAL HIGH (ref 0.1–1.0)
Monocytes Relative: 9 %
Neutro Abs: 7.2 10*3/uL (ref 1.7–7.7)
Neutrophils Relative %: 59 %
Platelets: 213 10*3/uL (ref 150–400)
RBC: 4.16 MIL/uL — ABNORMAL LOW (ref 4.22–5.81)
RDW: 14.6 % (ref 11.5–15.5)
WBC: 12.1 10*3/uL — ABNORMAL HIGH (ref 4.0–10.5)
nRBC: 0 % (ref 0.0–0.2)

## 2020-04-05 LAB — COMPREHENSIVE METABOLIC PANEL
ALT: 20 U/L (ref 0–44)
AST: 28 U/L (ref 15–41)
Albumin: 3 g/dL — ABNORMAL LOW (ref 3.5–5.0)
Alkaline Phosphatase: 77 U/L (ref 38–126)
Anion gap: 9 (ref 5–15)
BUN: 15 mg/dL (ref 8–23)
CO2: 24 mmol/L (ref 22–32)
Calcium: 8.1 mg/dL — ABNORMAL LOW (ref 8.9–10.3)
Chloride: 107 mmol/L (ref 98–111)
Creatinine, Ser: 0.77 mg/dL (ref 0.61–1.24)
GFR calc Af Amer: >60 mL/min (ref >60)
GFR calc non Af Amer: >60 mL/min (ref >60)
Glucose, Bld: 87 mg/dL (ref 70–99)
Potassium: 3.5 mmol/L (ref 3.5–5.1)
Sodium: 140 mmol/L (ref 135–145)
Total Bilirubin: 1.2 mg/dL (ref 0.3–1.2)
Total Protein: 6.7 g/dL (ref 6.5–8.1)

## 2020-04-05 LAB — BLOOD GAS, ARTERIAL
Acid-Base Excess: 1 mmol/L (ref 0.0–2.0)
Bicarbonate: 23.8 mmol/L (ref 20.0–28.0)
Drawn by: 25770
FIO2: 21
O2 Saturation: 92.5 %
Patient temperature: 98.6
pCO2 arterial: 33.5 mmHg (ref 32.0–48.0)
pH, Arterial: 7.467 — ABNORMAL HIGH (ref 7.350–7.450)
pO2, Arterial: 68.6 mmHg — ABNORMAL LOW (ref 83.0–108.0)

## 2020-04-05 LAB — PHOSPHORUS: Phosphorus: 3.1 mg/dL (ref 2.5–4.6)

## 2020-04-05 LAB — MAGNESIUM: Magnesium: 2.1 mg/dL (ref 1.7–2.4)

## 2020-04-05 MED ORDER — KCL IN DEXTROSE-NACL 40-5-0.9 MEQ/L-%-% IV SOLN
INTRAVENOUS | Status: DC
  Filled 2020-04-05: qty 1000

## 2020-04-05 MED ORDER — LORAZEPAM 0.5 MG PO TABS
0.5000 mg | ORAL_TABLET | Freq: Four times a day (QID) | ORAL | Status: DC | PRN
  Administered 2020-04-05 – 2020-04-09 (×4): 0.5 mg via ORAL
  Filled 2020-04-05 (×4): qty 1

## 2020-04-05 MED ORDER — HYDROCODONE-ACETAMINOPHEN 10-325 MG PO TABS
1.0000 | ORAL_TABLET | Freq: Every day | ORAL | Status: DC
  Administered 2020-04-06 – 2020-04-10 (×5): 1 via ORAL
  Filled 2020-04-05 (×5): qty 1

## 2020-04-05 MED ORDER — ACETAMINOPHEN 325 MG PO TABS
650.0000 mg | ORAL_TABLET | Freq: Three times a day (TID) | ORAL | Status: DC
  Administered 2020-04-06 – 2020-04-11 (×16): 650 mg via ORAL
  Filled 2020-04-05 (×16): qty 2

## 2020-04-05 NOTE — Progress Notes (Signed)
Patient drowsy and responds to pain. Dr. Marland Mcalpine notified. New orders implemented.

## 2020-04-05 NOTE — Progress Notes (Signed)
PROGRESS NOTE    ALANDIS BLUEMEL  HAL:[id number] DOB: 07-20-1935 DOA: 04/01/2020 PCP: Patient, No Pcp Per   Brief Narrative:  HPI per Dr. Esaw Grandchild on 04/01/20 TRASK VOSLER is a 85 y.o. male with medical history significant of dementia, hypertension, hyperlipidemia, peripheral vascular disease, psoriasis, vitamin D deficiency, vitamin B12 deficiency who presented to the ED from watchman placed today with left hip pain after an unwitnessed fall that was thought to have occurred yesterday.  History is limited by patient's dementia.  He is unable to tell me if he had a fall.  Currently denies pain and does not appear in any discomfort.  He does report his left hip hurts when I press on it.  Per report from EMS, staff at the facility expressed concerned for possible UTI as patient has been more confused than his baseline recently.  At his functional baseline, patient ambulates independently without assist device.  His niece reports that he spends his days walking the halls at his memory care facility.  She states that 99% of the time, patient is pleasant and cooperative, but did require sitter for safety during a previous admission last year.  ED Course: Temp 99.0, hypertensive at 178/84 with otherwise normal vitals.  CBC notable for leukocytosis 13.3k.  BMP unremarkable except for very mild hyperglycemia.  CT head was negative for acute findings, showed old lacunar infarcts, atrophy and chronic small vessel disease.  CT cervical spine was negative for fractures, showed mild degenerative changes.  Left hip x-ray was read as negative for fracture, therefore CT hip was obtained which does show comminuted fracture of the greater trochanter and possibly intertrochanteric as well.  Discussed with orthopedist Dr. Roda Shutters who recommended initially admitting for pain control and attempt PT evaluation prior to proceeding with surgery.  **Interim History  Working with PT the day before yesterday and they are  recommending SNF but they did not do any weightbearing as tolerated as he is not able to follow commands.  Will need orthopedic evaluation prior to safe discharge disposition given that he possibly will require surgical intervention however Dr. Roda Shutters is reviewed the CT in detail cannot find conclusive evidence of an IT fracture and recommends continue to see the patient mobilize with PT and weightbearing as tolerated.  He recommends obtaining left hip x-ray again in a week.  PT to reevaluate for weightbearing as tolerated.  Social work assisting with placement and he has been accepted to a facility but insurance authorization but was denied today.   Patient still not medically stable to be discharged currently given that he is extremely somnolent and more drowsy and not as interactive as he was a few days ago..  I tried to call for a peer to peer but no one picked up the phone..  We will work-up the patient somnolence and altered mental status worse than baseline and ABG and an MRI.  We also called palliative care for goals of care discussion given his acute change in worsening.  Patient was extremely somnolent and drowsy this morning and so if necessary will consult neurology  Assessment & Plan:   Principal Problem:   Closed left hip fracture (HCC) Active Problems:   Dementia (HCC)   HTN (hypertension)   Leucocytosis   Vitamin D deficiency   Vitamin B12 deficiency  Closed Left Hip Fracture  -Present on admission, s/p unwitnessed fall at SNF.   -Xray read as negative for fracture, but due to high clinical suspicion, hip CT was  obtained -Hip CT showed "Mildly comminuted fracture of the greater trochanter. Although highly indistinct on CT, there is a suspected nondisplaced intertrochanteric component, with part of this shown on image 38/6. Right groin hernia contains loops of bowel and extends down towards the scrotum. Sigmoid colon diverticulosis. Dependent calcifications in the urinary bladder,  probably small bladder stones. Iliac and left common femoral artery atherosclerosis." -Ortho following, Dr. Roda Shutters and appreciate further evaluation and insight and Dr. Roda Shutters is reviewed the CT in detail cannot find conclusive evidence of an IT fracture recommends continuing to see how the patient mobilized with PT and weightbearing as tolerated -Admit for pain control and attempt to work with PT prior to proceeding with Surgery; ET evaluated and patient up to sitting at the edge of the bed but required assistance to maintain his balance entire time.  His standing and his gait was deferred secondary to the patient's limited participation.  He will need to be weightbearing as tolerated -Pain control per orders and he is on IV morphine 0.5 mg every 2 hours as needed for severe pain as well as hydrocodone-acetaminophen 1 tab p.o. every 6 as needed for moderate pain -Continue Bowel Regimen with MiraLAX 17 g p.o. daily as needed for moderate constipation along with Senokot 8.6 mg 1 tab p.o. twice daily; patient also has 5 mg of bisacodyl enteric coated tablets daily as needed for moderate constipation -Maintenance IV fluids initiated at 75 MLS per hour now stopped today  -PT evaluation and they evaluated and recommending SNF however standing/gait was deferred secondary to patient limited participation lack of weightbearing however I put in the weightbearing status as tolerated and will need repeat PT evaluation -TOC consulted and found the patient placement insurance authorization was denied.  I called for peer-to-peer however the phone kept ringing and no one picked up  Unwitnessed Fall -Patient's CT Head and Cervical Spine showed "No evidence of acute intracranial abnormality. 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 Atrophy with small vessel ischemic changes. No evidence of traumatic injury to the cervical spine. Mild degenerative changes.." -PT/OT to evaluate and Treat and recommending SNF but it was denied  for SNF -U/A not consistent with infection  -Continue fall precautions  Leukocytosis  -present on admission, WBC 13.3k on admission and is was trending down to 11.7 but acutely went up to 13.7 and today is 12.1 -Per report from EMS, concern for UTI due to recently increased confusion over his baseline.   -U/A showed a clear appearance with negative hemoglobin, negative glucose, 20 ketones, negative leukocytes, negative nitrites, rare bacteria, 0-5 RBC per high-power field, 0-5 WBCs and 0-5 squamous epithelial cells -Chest x-ray on Admission showed "No acute cardiopulmonary abnormality or acute traumatic injury Identified." -Likely leukocytosis is reactive in the setting of his fall and was trending down -Continue to monitor for signs and symptoms of infection; Currently none -Repeat CBC in the a.m. and monitor temperature curve  Essential Hypertension  -Uncontrolled on admission 178/84, suspect due to pain.   -Appears he is not on antihypertensive medications outpatient.   -PRN oral hydralazine for now will be added 10 mg every 6 hours for systolic blood pressure greater than 160 or diastolic blood pressure greater than 90 -Monitor and consider daily medication if indicated   -Patient's blood pressure is 154/62 now  Elevated CK -Patient CK was 540 and now trending down to 118 and normalized -Continued IV fluid hydration as above but now stopped  -Repeat CK level in a.m.  Hypocalcemia -Ca2+  was 7.7 and corrected for the 2.9 Albumin is 8.6; now Ca2+ is 8.0 and corrected to 8.8 -Replete with IV Calcium Gluconate 1 gram yesterday -Continue to Monitor and Replete as Necessary -Repeat CMP in the AM  Normocytic Anemia -? Dilutional Drop -Hgb/Hct went from 14.2/43.5 -> 12.4/37.9 -> 11.3/34.8 -> 11.5/34.3 -Check Anemia Panel in the AM -Continue to Monitor for S/Sx of Bleeding; Currently no overt bleeding noted -Repeat CBC in the AM   Dementia with Acute worsening  Somnolence,  Drowsiness and confusion -Unknown if any behavioral disturbances.   -He is talking to me few days ago and would not respond to me today.  I sternal rub him and he would say something but had his eyes closed the entire time. -We will further work-up his encephalopathy with MRI, ABG and check a TSH, RPR, ammonia as well as a B12 level (already done.) -I discontinued his trazodone -Consulted palliative care for goals of care discussion -ABG showed a pH of 7.467, PCO2 33.5, PO2 of 68.6, bicarbonate level 23.8, and ABG O2 saturation of 92.5% on 21% FiO2 -Not on medications per med history. Continues to [place] but today he is worse than last few days I have seen him -Delirium precautions -Continue to monitor carefully  Vitamin D Deficiency -Continue daily supplement with cholecalciferol 2000 units p.o. daily  Vitamin B12 Deficiency  -Continue daily supplement with cyanocobalamin 1000 mcg p.o. daily  GOC: DNR, poA  DVT prophylaxis: SCDs Code Status: DO NOT RESUSCITATE  Family Communication: No family present at bedside  Disposition Plan: Pending further improvement and evaluation by Orthopedic Surgery; Needs to Ambulate and have PT with [facility] and PT OT recommending SNF however unfortunately has been denied and I could not contact the physician advisor for peer-to-peer as they did not pick up  Status is: Inpatient  Remains inpatient appropriate because:Unsafe d/c plan, IV treatments appropriate due to intensity of illness or inability to take PO and Inpatient level of care appropriate due to severity of illness   Dispo: The patient is from: SNF              Anticipated d/c is to: SNF              Anticipated d/c date is: 1-2 Days               Patient currently is not medically stable to d/c.  Consultants:   Orthopedics Dr. Roda Shutters   Procedures: None  Antimicrobials:  Anti-infectives (From admission, onward)   None      Subjective: Seen and examined at bedside and he was in  sitting in the chair but he is very difficult to arouse.  He is extremely somnolent and drowsy.  Would not follow commands or talk to me like he did a few days ago.  Sternal rub would mildly recommend he would mumble something but kept his eyes closed.  At baseline he was walking previous to this.  No other concerns or complaints but then nursing states that he was unable to tolerate his MRI.  Objective: Vitals:   04/05/20 0620 04/05/20 1000 04/05/20 1200 04/05/20 1334  BP: (!) 168/90 (!) 167/92 (!) 145/96 (!) 154/62  Pulse: 78 76 78 76  Resp: 16 15 15 17   Temp: 97.7 F (36.5 C) (!) 97.2 F (36.2 C) 98.9 F (37.2 C) (!) 97.4 F (36.3 C)  TempSrc: Oral     SpO2: 99% 95% 98% 96%  Weight:      Height:  Intake/Output Summary (Last 24 hours) at 04/05/2020 1843 Last data filed at 04/05/2020 1700 Gross per 24 hour  Intake 0 ml  Output 1150 ml  Net -1150 ml   Filed Weights   04/01/20 2012  Weight: 54.1 kg   Examination: Physical Exam:  Constitutional: Thin chronically ill-appearing Caucasian male and, NAD and appears calm but he is extremely drowsy and somnolent and difficult to arouse sitting in a chair Eyes: Would not open his eyes and kept his eyes closed.  Lids are normal.   ENMT: External Ears, Nose appear normal.  Neck: Appears normal, supple, no cervical masses, normal ROM, no appreciable thyromegaly neck: No JVD Respiratory: Diminished to auscultation bilaterally, no wheezing, rales, rhonchi or crackles. Normal respiratory effort and patient is not tachypenic. No accessory muscle use.  Cardiovascular: RRR, no murmurs / rubs / gallops. S1 and S2 auscultated. No extremity edema.  Abdomen: Soft, non-tender, non-distended. Bowel sounds positive.  GU: Deferred. Musculoskeletal: No clubbing / cyanosis of digits/nails. No joint deformity upper and lower extremities.  Skin: No rashes, lesions, ulcers. No induration; Warm and dry.  Neurologic: He is extremely somnolent and does  not follow commands today.  Very difficult to arouse and sternal rub does not really wake him up Psychiatric: Impaired judgment and insight.  He is not alert and oriented x 3.   Data Reviewed: I have personally reviewed following labs and imaging studies  CBC: Recent Labs  Lab 04/01/20 0844 04/02/20 0246 04/03/20 0326 04/04/20 0327 04/05/20 0224  WBC 13.3* 12.8* 11.7* 13.7* 12.1*  NEUTROABS 9.0*  --  6.8 8.8* 7.2  HGB 14.2 12.4* 11.3* 11.5* 13.0  HCT 43.5 37.9* 34.8* 34.3* 39.5  MCV 94.2 94.0 96.1 95.5 95.0  PLT 262 222 207 216 213   Basic Metabolic Panel: Recent Labs  Lab 04/01/20 0844 04/02/20 0246 04/03/20 0326 04/04/20 0327 04/05/20 0224  NA 136 136 137 141 140  K 3.7 3.6 3.6 3.8 3.5  CL 99 104 109 108 107  CO2 27 26 27 26 24   GLUCOSE 107* 92 94 108* 87  BUN 21 21 22 19 15   CREATININE 0.98 1.01 0.99 1.08 0.77  CALCIUM 8.8* 8.2* 7.7* 8.0* 8.1*  MG  --   --  2.1 2.0 2.1  PHOS  --   --  2.5 2.7 3.1   GFR: Estimated Creatinine Clearance: 54.5 mL/min (by C-G formula based on SCr of 0.77 mg/dL). Liver Function Tests: Recent Labs  Lab 04/03/20 0326 04/04/20 0327 04/05/20 0224  AST 25 26 28   ALT 16 18 20   ALKPHOS 73 73 77  BILITOT 1.0 1.0 1.2  PROT 5.8* 6.0* 6.7  ALBUMIN 2.9* 3.0* 3.0*   No results for input(s): LIPASE, AMYLASE in the last 168 hours. No results for input(s): AMMONIA in the last 168 hours. Coagulation Profile: Recent Labs  Lab 04/01/20 0844  INR 1.0   Cardiac Enzymes: Recent Labs  Lab 04/01/20 0844 04/02/20 0900 04/04/20 0327  CKTOTAL 540* 455* 118   BNP (last 3 results) No results for input(s): PROBNP in the last 8760 hours. HbA1C: No results for input(s): HGBA1C in the last 72 hours. CBG: No results for input(s): GLUCAP in the last 168 hours. Lipid Profile: No results for input(s): CHOL, HDL, LDLCALC, TRIG, CHOLHDL, LDLDIRECT in the last 72 hours. Thyroid Function Tests: No results for input(s): TSH, T4TOTAL, FREET4,  T3FREE, THYROIDAB in the last 72 hours. Anemia Panel: No results for input(s): VITAMINB12, FOLATE, FERRITIN, TIBC, IRON, RETICCTPCT in the last  72 hours. Sepsis Labs: No results for input(s): PROCALCITON, LATICACIDVEN in the last 168 hours.  Recent Results (from the past 240 hour(s))  SARS Coronavirus 2 by RT PCR (hospital order, performed in [facility] hospital lab) Nasopharyngeal Nasopharyngeal Swab     Status: None   Collection Time: 04/01/20  8:44 AM   Specimen: Nasopharyngeal Swab  Result Value Ref Range Status   SARS Coronavirus 2 NEGATIVE NEGATIVE Final    Comment: (NOTE) SARS-CoV-2 target nucleic acids are NOT DETECTED.  The SARS-CoV-2 RNA is generally detectable in upper and lower respiratory specimens during the acute phase of infection. The lowest concentration of SARS-CoV-2 viral copies this assay can detect is 250 copies / mL. A negative result does not preclude SARS-CoV-2 infection and should not be used as the sole basis for treatment or other patient management decisions.  A negative result may occur with improper specimen collection / handling, submission of specimen other than nasopharyngeal swab, presence of viral mutation(s) within the areas targeted by this assay, and inadequate number of viral copies (<250 copies / mL). A negative result must be combined with clinical observations, patient history, and epidemiological information.  Fact Sheet for Patients:   BoilerBrush.com.cy  Fact Sheet for Healthcare Providers: https://pope.com/  This test is not yet approved or  cleared by the [place] FDA and has been authorized for detection and/or diagnosis of SARS-CoV-2 by FDA under an Emergency Use Authorization (EUA).  This EUA will remain in effect (meaning this test can be used) for the duration of the COVID-19 declaration under Section 564(b)(1) of the Act, 21 U.S.C. section 360bbb-3(b)(1), unless the  authorization is terminated or revoked sooner.  Performed at [facility], [address] [place], [place] [address]   SARS Coronavirus 2 by RT PCR (hospital order, performed in [facility] hospital lab) Nasopharyngeal Nasopharyngeal Swab     Status: None   Collection Time: 04/04/20  2:02 PM   Specimen: Nasopharyngeal Swab  Result Value Ref Range Status   SARS Coronavirus 2 NEGATIVE NEGATIVE Final    Comment: (NOTE) SARS-CoV-2 target nucleic acids are NOT DETECTED.  The SARS-CoV-2 RNA is generally detectable in upper and lower respiratory specimens during the acute phase of infection. The lowest concentration of SARS-CoV-2 viral copies this assay can detect is 250 copies / mL. A negative result does not preclude SARS-CoV-2 infection and should not be used as the sole basis for treatment or other patient management decisions.  A negative result may occur with improper specimen collection / handling, submission of specimen other than nasopharyngeal swab, presence of viral mutation(s) within the areas targeted by this assay, and inadequate number of viral copies (<250 copies / mL). A negative result must be combined with clinical observations, patient history, and epidemiological information.  Fact Sheet for Patients:   BoilerBrush.com.cy  Fact Sheet for Healthcare Providers: https://pope.com/  This test is not yet approved or  cleared by the [place] FDA and has been authorized for detection and/or diagnosis of SARS-CoV-2 by FDA under an Emergency Use Authorization (EUA).  This EUA will remain in effect (meaning this test can be used) for the duration of the COVID-19 declaration under Section 564(b)(1) of the Act, 21 U.S.C. section 360bbb-3(b)(1), unless the authorization is terminated or revoked sooner.  Performed at [facility], [address], [place], [place] [address]     RN  Pressure Injury Documentation: Pressure Injury 09/03/19 Buttocks Medial Stage I -  Intact skin with non-blanchable redness  of a localized area usually over a bony prominence. (Active)  09/03/19   Location: Buttocks  Location Orientation: Medial  Staging: Stage I -  Intact skin with non-blanchable redness of a localized area usually over a bony prominence.  Wound Description (Comments):   Present on Admission:    Estimated body mass index is 19.25 kg/m as calculated from the following:   Height as of this encounter: 5\' 6"  (1.676 m).   Weight as of this encounter: 54.1 kg.  Malnutrition Type:  Nutrition Problem: Increased nutrient needs Etiology: post-op healing   Malnutrition Characteristics:  Signs/Symptoms: estimated needs   Nutrition Interventions:  Interventions: Refer to RD note for recommendations   Radiology Studies: MR BRAIN WO CONTRAST  Result Date: 04/05/2020 CLINICAL DATA:  Encephalopathy. EXAM: MRI HEAD WITHOUT CONTRAST TECHNIQUE: Multiplanar, multiecho pulse sequences of the brain and surrounding structures were obtained without intravenous contrast. COMPARISON:  Head CT April 01, 2020 FINDINGS: Patient unable to tolerate lying still in the scanner for study. Only scout images obtained which are nondiagnostic. IMPRESSION: Only scout images obtained which are nondiagnostic. Electronically Signed   By: Pedro Earls M.D.   On: 04/05/2020 15:09   Scheduled Meds:  cholecalciferol  2,000 Units Oral Daily   senna  1 tablet Oral BID   vitamin B-12  1,000 mcg Oral Daily   Continuous Infusions:  dextrose 5 % and 0.9 % NaCl with KCl 40 mEq/L 75 mL/hr at 04/05/20 1245    LOS: 4 days   Kerney Elbe, DO [facility] PAGER is on AMION  If 7PM-7AM, please contact night-coverage [email or web address]

## 2020-04-05 NOTE — Progress Notes (Signed)
Notified Lab ABG being sent for analysis. 

## 2020-04-05 NOTE — Consult Note (Signed)
Palliative Care  Consult Note Reason: Goals of Care  Mr. Kristopher Evans is an 85 yo man with advanced Alzheimer's type Dementia who was admitted to the hospital following an unwitnessed fall at his memory care unit. He is from [facility] where has has resided for the past several years. His QOL is walking the halls at his ALF. He does not communicate clear thoughts or demonstrate insight into his condition but can answer simple questions about what he needs. He does not consistently follow commands or instructions. In general though he has been content and happy having his needs met in this ALF residence.  I spoke at length with his HCPOA niece Katharine Look who is his only living family member able to assist him with his needs. She has a very realistic outlook on his condition and wants the approach to his care to be entirely focused on comfort, dignity, QOL and not unnecessarily prolonging his life or forcing him to undergo medical interventions not directly related to comfort care- this includes painful or difficult rehabilitation, tests, procedures or diagnostics. We discussed the decline and trajectory following a hip fracture in advanced dementia.  Prognosis: I suspect this fall and fracture will lead to a slow but progressive decline-he may require medications for his comfort consistently moving forward. He is very appropriate for hospice care referral upon discharge <6 months due to complications of immobility and deconditioning and subsequent poor appetite and agitaton. Katharine Look is agreeable to hospice services.  Recommendations:  1. DNR, comfort measures 2. Provide supportive care and align all interventions around his comfort and dignity-allow nature to take it's course and treat signs of distress and discomfort. 3. Given stated goals would discontinue invasive diagnostics, no surgery, no additional infectious or neurological work-up. 4. Katharine Look requests Mitts be removed, even if  that means we need to also remove his IV-she believes this will cause him distress. 5. Will need to evaluate his PO intake-no feeding tubes now or in the future-needs assistance with meals. Aspiration risk but will not limited or modify his intake or textures. 6. Maintain safety, fall risk and delirium prevention interventions. 7. Will schedule pain medications-will start with scheduled tylenol and PM dose of hydrocodone which he has been on in the past and tolerated. 8. Will need a bowel regimen.  Disposition: Will need to see what level of care [facility] can provide if care is supplemented with hospice services. Otherwise he will need a LTC facility with hospice Services. He will likely not be able to participate in skilled rehab due to severe dementia and new mobility limitations- we should still encourage mobility and assess his functional status to the degree he is willing to participate - walking was a huge aspect of his quality of life and without that he is likely to decline quickly. Katharine Look requested assistance with completing a medicaid application-I encouraged this since his prognosis while probably limited is not entirely certain at this point.  Will continue to follow and support during this hospitalization.  Lane Hacker, DO Palliative Medicine  Time: 60 minutes Greater than 50%  of this time was spent counseling and coordinating care related to the above assessment and plan.

## 2020-04-05 NOTE — TOC Progression Note (Addendum)
Transition of Care [facility]) - Progression Note    Patient Details  Name: Kristopher Evans MRN: [id number] Date of Birth: 04/30/1935  [facility] CM/SW Contact  Clearance Coots, LCSW Phone Number: 04/05/2020, 5:06 PM  Clinical Narrative:    SNF rehab auth. is Denied at this point. Patient not medically stable. Patient will need to show improvement with Physical Therapy.  CSW notified the physician about the peer to peer request with Dr. Logan Bores [phone]  Patient authorization for EMS transport ([id number]) approved at discharge.     Expected Discharge Plan: Skilled Nursing Facility Barriers to Discharge: Continued Medical Work up, English as a second language teacher  Expected Discharge Plan and Services Expected Discharge Plan: Skilled Nursing Facility In-house Referral: Clinical Social Work Discharge Planning Services: CM Consult Post Acute Care Choice: Skilled Nursing Facility Living arrangements for the past 2 months: Assisted Living Facility (Memory Care)                 DME Arranged: N/A DME Agency: NA       HH Arranged: NA HH Agency: NA         Social Determinants of Health (SDOH) Interventions    Readmission Risk Interventions No flowsheet data found.

## 2020-04-05 NOTE — Progress Notes (Signed)
Physical Therapy Treatment Patient Details Name: Kristopher Evans MRN: [id number] DOB: 1935/04/01 Today's Date: 04/05/2020    History of Present Illness Pt s/p fall at facility([facility]) and now with L hip contusion and per imaging, mildly comminuted fx of L greater trochanter and suspected non-displaced intertrochanteric component.  Per order "Ortho not entirely convinced of a true fx".  Pt with hx of dementia    PT Comments    Pt in bed with B mittens and unable to arouse.  Assisted to EOB to attempt stimulation.  Briefly responds to VC's but < 25%.  General bed mobility comments: Pt assisted to EOB sitting with use of pad.  Pt lethargic and roused somewhat at EOB sitting but unable to maintain to support self severe posterior lean.  General transfer comment: assisted 1/4 turn to recliner + 2 total asssit pt 0%.  Rigid fetal position.  + 2 to scoot upright in recliner.  Multiple pillows to position to comfort and offer pressure relief.  Pt will need SNF level of care.  Follow Up Recommendations  SNF     Equipment Recommendations  None recommended by PT    Recommendations for Other Services       Precautions / Restrictions Precautions Precautions: Fall Precaution Comments: advanced dementia Restrictions Weight Bearing Restrictions: No Other Position/Activity Restrictions: WBAT    Mobility  Bed Mobility Overal bed mobility: Needs Assistance Bed Mobility: Supine to Sit     Supine to sit: +2 for physical assistance;+2 for safety/equipment;Total assist     General bed mobility comments: Pt assisted to EOB sitting with use of pad.  Pt lethargic and roused somewhat at EOB sitting but unable to maintain to support self severe posterior lean  Transfers Overall transfer level: Needs assistance Equipment used: 2 person hand held assist Transfers: Stand Pivot Transfers   Stand pivot transfers: +2 physical assistance;+2 safety/equipment;Total assist;From elevated surface        General transfer comment: assisted 1/4 turn to recliner + 2 total asssit pt 0%.  Rigid fetal position.  + 2 to scoot upright in recliner.  Multiple pillows to position to comfort and offer pressure relief.  Ambulation/Gait                 Stairs             Wheelchair Mobility    Modified Rankin (Stroke Patients Only)       Balance                                            Cognition Arousal/Alertness: Lethargic Behavior During Therapy: Flat affect                                   General Comments: responded briefly to verbal <10% for most part rigid and eyes shut      Exercises      General Comments        Pertinent Vitals/Pain      Home Living                      Prior Function            PT Goals (current goals can now be found in the care plan section) Progress towards PT goals: Progressing toward goals  Frequency    Min 2X/week      PT Plan      Co-evaluation              AM-PAC PT "6 Clicks" Mobility   Outcome Measure  Help needed turning from your back to your side while in a flat bed without using bedrails?: Total Help needed moving from lying on your back to sitting on the side of a flat bed without using bedrails?: Total Help needed moving to and from a bed to a chair (including a wheelchair)?: Total Help needed standing up from a chair using your arms (e.g., wheelchair or bedside chair)?: Total Help needed to walk in hospital room?: Total Help needed climbing 3-5 steps with a railing? : Total 6 Click Score: 6    End of Session Equipment Utilized During Treatment: Gait belt Activity Tolerance: Patient limited by lethargy Patient left: in chair;with call bell/phone within reach;with chair alarm set Nurse Communication:  (RN assisted with transfer) PT Visit Diagnosis: Difficulty in walking, not elsewhere classified (R26.2)     Time: 0945-1000 PT Time Calculation  (min) (ACUTE ONLY): 15 min  Charges:  $Therapeutic Activity: 8-22 mins                     Felecia Shelling  PTA Acute  Rehabilitation Services Pager      [phone] Office      [phone]

## 2020-04-06 DIAGNOSIS — Z515 Encounter for palliative care: Secondary | ICD-10-CM

## 2020-04-06 MED ORDER — ENSURE ENLIVE PO LIQD
237.0000 mL | Freq: Two times a day (BID) | ORAL | Status: DC
  Administered 2020-04-06 – 2020-04-11 (×9): 237 mL via ORAL

## 2020-04-06 NOTE — Plan of Care (Signed)
Plan of care reviewed.  New additions to plan for comfort care.

## 2020-04-06 NOTE — TOC Progression Note (Addendum)
Transition of Care [facility]) - Progression Note    Patient Details  Name: Kristopher Evans MRN: [id number] Date of Birth: 1935/04/17  Transition [facility]) CM/SW Contact  Clearance Coots, LCSW Phone Number: 04/06/2020, 4:07 PM  Clinical Narrative:    CSW spoke with the patient niece extensively about the patient HTA denial letter. Also, palliative recommendation for Hopsice. Patient niece is in agreement  with the recommendation for hospice services. She is concern where the patient will discharge and if his insurance will cover the cost. CSW explain that insurance will not pay for long term care room and board at a facility. She reports the patient has limited amount of funds at this time, and after medical bills are paid may not any money left in his savings. She has been talking with [facility] about applying for medicaid on behalf of the patient but never received a follow up.   It would be ideal for the patient to discharge back to [facility] with Hospice services following if the facility will allow. If not, the patient will have to discharge to SNF/private pay for a few months for room and board while waiting for medicaid benefits, if the patient qualifies.  CSW will reach out to financial services for assistance with a medicaid application.  *CSW provided niece with a copy of HTA advantage denial letter via e-mail    [facility] staff will continue to assist with discharge needs.    Expected Discharge Plan: Skilled Nursing Facility Barriers to Discharge: Continued Medical Work up  Expected Discharge Plan and Services Expected Discharge Plan: Skilled Nursing Facility In-house Referral: Clinical Social Work Discharge Planning Services: CM Consult Post Acute Care Choice: Skilled Nursing Facility Living arrangements for the past 2 months: Assisted Living Facility (Memory Care)                 DME Arranged: N/A DME Agency: NA       HH Arranged: NA HH Agency: NA          Social Determinants of Health (SDOH) Interventions    Readmission Risk Interventions No flowsheet data found.

## 2020-04-06 NOTE — Progress Notes (Signed)
PROGRESS NOTE    Kristopher Evans    Code Status: DNR  ITG:[id number] DOB: [birth year] DOA: 04/01/2020 LOS: 5 days  PCP: Patient, No Pcp Per CC:  Chief Complaint  Patient presents with  [facility] Summary   This is an 85 year old male with history of dementia, hypertension, hyperlipidemia, PVD, psoriasis, vitamin D and B12 deficiencies who presented to the ED with left hip pain after an unwitnessed fall that was thought to have occurred on 6/17.  Per EMS and staff at his facility there was concern for possible UTI as the patient had been more confused than his baseline as of recently.  ED Course:Temp 99.0,hypertensive at 178/84 with otherwise normal vitals. CBC notable for leukocytosis 13.3k.BMP unremarkable except for very mild hyperglycemia. CT head was negative for acute findings, showed old lacunar infarcts, atrophy and chronic small vessel disease. CT cervical spine was negative for fractures, showed mild degenerative changes. Left hip x-ray was read as negative for fracture, therefore CT hip was obtained which does show comminuted fracture of the greater trochanter and possibly intertrochanteric as well. Discussed with orthopedist Dr. Rodolph Bong recommended initially admitting for pain control and attempt PT evaluation prior to proceeding with surgery however after further evaluation of CT there was inconclusive evidence of fracture and Ortho recommended to see the patient mobilized with PT and weight-bear as tolerated.  Unfortunately, the patient was still not medically stable to be discharged and was increasingly and extremely somnolent and more drowsy and with less interaction over the past several days.  Palliative care was consulted who recommended comfort care and hospice  A & P   Principal Problem:   Closed left hip fracture (HCC) Active Problems:   Dementia (HCC)   HTN (hypertension)   Leucocytosis   Vitamin D deficiency   Vitamin B12 deficiency   1. Closed  left hip fracture s/p unwitnessed fall at SNF a. PT initially recommended SNF however standing/gait was deferred secondary to patient's limited participation and lack of WBAT b. TOC consulted and found SNF placement however insurance authorization was denied, prior hospitalist called for peer-to-peer without success. c. No surgery at this time, no additional infectious or neurologic work-up d. Palliative care consulted: Recommending comfort measures and hospice e. TOC team consulted, appreciate assistance with placement  2. Reactive leukocytosis a. Remains elevated at 12.1  3. Hypertension, stable  4. Hypocalcemia a. Received calcium gluconate 1 g IV x1  5. Dilutional anemia, resolved  6. Advanced dementia with somnolence, drowsiness and confusion a. Continues to decline despite med rec b. Comfort measures  7. Vitamin D and B12 deficiency a. Comfort measures   DVT prophylaxis: SCDs Start: 04/01/20 1053   Family Communication: No family at bedside  Disposition Plan:  Status is: Inpatient  Remains inpatient appropriate because:Inpatient level of care appropriate due to severity of illness   Dispo: The patient is from: ALF              Anticipated d/c is to: TBD              Anticipated d/c date is: 1 day              Patient currently is not medically stable to d/c.          Pressure injury documentation   Pressure Injury 09/03/19 Buttocks Medial Stage I -  Intact skin with non-blanchable redness of a localized area usually over a bony prominence. (Active)  09/03/19  Location: Buttocks  Location Orientation: Medial  Staging: Stage I -  Intact skin with non-blanchable redness of a localized area usually over a bony prominence.  Wound Description (Comments):   Present on Admission:    None  Consultants  Palliative  Procedures  None  Antibiotics   Anti-infectives (From admission, onward)   None        Subjective   Patient is sleeping  comfortably and did not arouse to verbal stimuli. Objective   Vitals:   04/05/20 1200 04/05/20 1334 04/05/20 2259 04/06/20 1413  BP: (!) 145/96 (!) 154/62 (!) 153/71 140/85  Pulse: 78 76 70 72  Resp: 15 17 16 14   Temp: 98.9 F (37.2 C) (!) 97.4 F (36.3 C) 97.9 F (36.6 C) 98.5 F (36.9 C)  TempSrc:   Oral   SpO2: 98% 96% 100% 99%  Weight:      Height:        Intake/Output Summary (Last 24 hours) at 04/06/2020 1636 Last data filed at 04/06/2020 1330 Gross per 24 hour  Intake 576.75 ml  Output 600 ml  Net -23.25 ml   Filed Weights   04/01/20 2012  Weight: 54.1 kg    Examination:  Physical Exam Constitutional:      General: He is not in acute distress.    Appearance: He is ill-appearing.     Comments: Somnolent  Cardiovascular:     Rate and Rhythm: Regular rhythm. Tachycardia present.  Pulmonary:     Effort: No respiratory distress.     Breath sounds: No stridor.  Abdominal:     General: Abdomen is flat.  Musculoskeletal:        General: No swelling or tenderness.  Skin:    Coloration: Skin is not jaundiced.     Data Reviewed: I have personally reviewed following labs and imaging studies  CBC: Recent Labs  Lab 04/01/20 0844 04/02/20 0246 04/03/20 0326 04/04/20 0327 04/05/20 0224  WBC 13.3* 12.8* 11.7* 13.7* 12.1*  NEUTROABS 9.0*  --  6.8 8.8* 7.2  HGB 14.2 12.4* 11.3* 11.5* 13.0  HCT 43.5 37.9* 34.8* 34.3* 39.5  MCV 94.2 94.0 96.1 95.5 95.0  PLT 262 222 207 216 213   Basic Metabolic Panel: Recent Labs  Lab 04/01/20 0844 04/02/20 0246 04/03/20 0326 04/04/20 0327 04/05/20 0224  NA 136 136 137 141 140  K 3.7 3.6 3.6 3.8 3.5  CL 99 104 109 108 107  CO2 27 26 27 26 24   GLUCOSE 107* 92 94 108* 87  BUN 21 21 22 19 15   CREATININE 0.98 1.01 0.99 1.08 0.77  CALCIUM 8.8* 8.2* 7.7* 8.0* 8.1*  MG  --   --  2.1 2.0 2.1  PHOS  --   --  2.5 2.7 3.1   GFR: Estimated Creatinine Clearance: 54.5 mL/min (by C-G formula based on SCr of 0.77  mg/dL). Liver Function Tests: Recent Labs  Lab 04/03/20 0326 04/04/20 0327 04/05/20 0224  AST 25 26 28   ALT 16 18 20   ALKPHOS 73 73 77  BILITOT 1.0 1.0 1.2  PROT 5.8* 6.0* 6.7  ALBUMIN 2.9* 3.0* 3.0*   No results for input(s): LIPASE, AMYLASE in the last 168 hours. No results for input(s): AMMONIA in the last 168 hours. Coagulation Profile: Recent Labs  Lab 04/01/20 0844  INR 1.0   Cardiac Enzymes: Recent Labs  Lab 04/01/20 0844 04/02/20 0900 04/04/20 0327  CKTOTAL 540* 455* 118   BNP (last 3 results) No results for input(s): PROBNP in the  last 8760 hours. HbA1C: No results for input(s): HGBA1C in the last 72 hours. CBG: No results for input(s): GLUCAP in the last 168 hours. Lipid Profile: No results for input(s): CHOL, HDL, LDLCALC, TRIG, CHOLHDL, LDLDIRECT in the last 72 hours. Thyroid Function Tests: No results for input(s): TSH, T4TOTAL, FREET4, T3FREE, THYROIDAB in the last 72 hours. Anemia Panel: No results for input(s): VITAMINB12, FOLATE, FERRITIN, TIBC, IRON, RETICCTPCT in the last 72 hours. Sepsis Labs: No results for input(s): PROCALCITON, LATICACIDVEN in the last 168 hours.  Recent Results (from the past 240 hour(s))  SARS Coronavirus 2 by RT PCR (hospital order, performed in [facility] hospital lab) Nasopharyngeal Nasopharyngeal Swab     Status: None   Collection Time: 04/01/20  8:44 AM   Specimen: Nasopharyngeal Swab  Result Value Ref Range Status   SARS Coronavirus 2 NEGATIVE NEGATIVE Final    Comment: (NOTE) SARS-CoV-2 target nucleic acids are NOT DETECTED.  The SARS-CoV-2 RNA is generally detectable in upper and lower respiratory specimens during the acute phase of infection. The lowest concentration of SARS-CoV-2 viral copies this assay can detect is 250 copies / mL. A negative result does not preclude SARS-CoV-2 infection and should not be used as the sole basis for treatment or other patient management decisions.  A negative result may  occur with improper specimen collection / handling, submission of specimen other than nasopharyngeal swab, presence of viral mutation(s) within the areas targeted by this assay, and inadequate number of viral copies (<250 copies / mL). A negative result must be combined with clinical observations, patient history, and epidemiological information.  Fact Sheet for Patients:   BoilerBrush.com.cy  Fact Sheet for Healthcare Providers: https://pope.com/  This test is not yet approved or  cleared by the [place] FDA and has been authorized for detection and/or diagnosis of SARS-CoV-2 by FDA under an Emergency Use Authorization (EUA).  This EUA will remain in effect (meaning this test can be used) for the duration of the COVID-19 declaration under Section 564(b)(1) of the Act, 21 U.S.C. section 360bbb-3(b)(1), unless the authorization is terminated or revoked sooner.  Performed at [facility], [address], [place], [place] [address]   SARS Coronavirus 2 by RT PCR (hospital order, performed in [facility] hospital lab) Nasopharyngeal Nasopharyngeal Swab     Status: None   Collection Time: 04/04/20  2:02 PM   Specimen: Nasopharyngeal Swab  Result Value Ref Range Status   SARS Coronavirus 2 NEGATIVE NEGATIVE Final    Comment: (NOTE) SARS-CoV-2 target nucleic acids are NOT DETECTED.  The SARS-CoV-2 RNA is generally detectable in upper and lower respiratory specimens during the acute phase of infection. The lowest concentration of SARS-CoV-2 viral copies this assay can detect is 250 copies / mL. A negative result does not preclude SARS-CoV-2 infection and should not be used as the sole basis for treatment or other patient management decisions.  A negative result may occur with improper specimen collection / handling, submission of specimen other than nasopharyngeal swab, presence of viral mutation(s) within  the areas targeted by this assay, and inadequate number of viral copies (<250 copies / mL). A negative result must be combined with clinical observations, patient history, and epidemiological information.  Fact Sheet for Patients:   BoilerBrush.com.cy  Fact Sheet for Healthcare Providers: https://pope.com/  This test is not yet approved or  cleared by the [place] FDA and has been authorized for detection and/or diagnosis of SARS-CoV-2 by FDA under an Emergency Use Authorization (EUA).  This EUA will remain in effect (meaning this test can be used) for the duration of the COVID-19 declaration under Section 564(b)(1) of the Act, 21 U.S.C. section 360bbb-3(b)(1), unless the authorization is terminated or revoked sooner.  Performed at [facility], [place] [address] [place], [place] [address]          Radiology Studies: MR BRAIN WO CONTRAST  Result Date: 04/05/2020 CLINICAL DATA:  Encephalopathy. EXAM: MRI HEAD WITHOUT CONTRAST TECHNIQUE: Multiplanar, multiecho pulse sequences of the brain and surrounding structures were obtained without intravenous contrast. COMPARISON:  Head CT April 01, 2020 FINDINGS: Patient unable to tolerate lying still in the scanner for study. Only scout images obtained which are nondiagnostic. IMPRESSION: Only scout images obtained which are nondiagnostic. Electronically Signed   By: Pedro Earls M.D.   On: 04/05/2020 15:09        Scheduled Meds: . acetaminophen  650 mg Oral TID  . cholecalciferol  2,000 Units Oral Daily  . HYDROcodone-acetaminophen  1 tablet Oral QHS  . senna  1 tablet Oral BID  . vitamin B-12  1,000 mcg Oral Daily   Continuous Infusions:   Time spent: 20 minutes with over 50% of the time coordinating the patient's care    Harold Hedge, DO [facility] Pager [phone]  Call night coverage person covering after 7pm

## 2020-04-06 NOTE — Progress Notes (Signed)
Nutrition Follow-up  RD working remotely.  DOCUMENTATION CODES:   Not applicable  INTERVENTION:  - will order Ensure Enlive BID, each supplement provides 350 kcal and 20 grams of protein.  NUTRITION DIAGNOSIS:   Increased nutrient needs related to post-op healing as evidenced by estimated needs.  GOAL:   Patient will meet greater than or equal to 90% of their needs  MONITOR:   PO intake, Supplement acceptance, Labs, Weight trends, Skin  ASSESSMENT:   85 y.o. male with medical history of dementia, HTN, hyperlipidemia, PVD, psoriasis, vitamin D deficiency, and vitamin B12 deficiency. He presented to the ED from memory care facility with L hip pain after an unwitnessed fall that was thought to have occurred 6/17. Notes from the ED state that EMS reported that facility staff were concerned about possible UTI as patient has been more confused than his baseline.  Patient is disoriented x4. Per flow sheet documentation, he has been eating variably: 0-100% over the past 5 days. He has not been weighed since admission on 6/18.  Per notes: - Palliative Care following and discharge planning is ongoing - severe dementia - L hip fx s/p unwitnessed fall--no surgery planned    Labs reviewed;  Ca: 8.1 mg/dl. Medications reviewed; 2000 units cholecalciferol/day, 1 tablet senokot BID, 1000 mcg oral cyanocobalamin/day.    NUTRITION - FOCUSED PHYSICAL EXAM:  unable to complete at this time.   Diet Order:   Diet Order            Diet Heart Room service appropriate? Yes; Fluid consistency: Thin  Diet effective now                 EDUCATION NEEDS:   No education needs have been identified at this time  Skin:  Skin Assessment: Reviewed RN Assessment  Last BM:  6/23  Height:   Ht Readings from Last 1 Encounters:  04/01/20 5\' 6"  (1.676 m)    Weight:   Wt Readings from Last 1 Encounters:  04/01/20 54.1 kg    Estimated Nutritional Needs:  Kcal:  1750-1950 kcal Protein:   90-105 grams Fluid:  >/= 2 L/day     04/03/20, MS, RD, LDN, CNSC Inpatient Clinical Dietitian RD pager # available in [facility]  After hours/weekend pager # available in [facility]

## 2020-04-07 LAB — SARS CORONAVIRUS 2 (TAT 6-24 HRS): SARS Coronavirus 2: NEGATIVE

## 2020-04-07 NOTE — Progress Notes (Signed)
PROGRESS NOTE    EDDI HYMES    Code Status: DNR  UVJ:[id number] DOB: [birth year] DOA: 04/01/2020 LOS: 6 days  PCP: Patient, No Pcp Per CC:  Chief Complaint  Patient presents with  . [facility] Summary   This is an 85 year old male with history of dementia, hypertension, hyperlipidemia, PVD, psoriasis, vitamin D and B12 deficiencies who presented to the ED with left hip pain after an unwitnessed fall that was thought to have occurred on 6/17.  Per EMS and staff at his facility there was concern for possible UTI as the patient had been more confused than his baseline as of recently.  ED Course:Temp 99.0,hypertensive at 178/84 with otherwise normal vitals. CBC notable for leukocytosis 13.3k.BMP unremarkable except for very mild hyperglycemia. CT head was negative for acute findings, showed old lacunar infarcts, atrophy and chronic small vessel disease. CT cervical spine was negative for fractures, showed mild degenerative changes. Left hip x-ray was read as negative for fracture, therefore CT hip was obtained which does show comminuted fracture of the greater trochanter and possibly intertrochanteric as well. Discussed with orthopedist Dr. Rodolph Bong recommended initially admitting for pain control and attempt PT evaluation prior to proceeding with surgery however after further evaluation of CT there was inconclusive evidence of fracture and Ortho recommended to see the patient mobilized with PT and weight-bear as tolerated.  Unfortunately, the patient was still not medically stable to be discharged and was increasingly and extremely somnolent and more drowsy and with less interaction over the past several days.  Palliative care was consulted who recommended comfort care and hospice  A & P   Principal Problem:   Closed left hip fracture (HCC) Active Problems:   Dementia (HCC)   HTN (hypertension)   Leucocytosis   Vitamin D deficiency   Vitamin B12 deficiency   1. Closed  left hip fracture s/p unwitnessed fall at SNF a. PT initially recommended SNF however standing/gait was deferred secondary to patient's limited participation and lack of WBAT b. TOC consulted and found SNF placement however insurance authorization was denied, prior hospitalist called for peer-to-peer without success. c. No surgery at this time, no additional infectious or neurologic work-up d. Palliative care consulted: Recommending comfort measures and hospice e. TOC team consulted, appreciate assistance with placement f. Likely discharge tomorrow pending COVID-19 results  2. Reactive leukocytosis  3. Hypertension, stable  4. Hypocalcemia a. Received calcium gluconate 1 g IV x1  5. Dilutional anemia, resolved  6. Advanced dementia with somnolence, drowsiness and confusion a. Unfortunately he has continued to decline throughout his hospitalization though he is stable today despite med rec b. Comfort measures and hospice services at discharge  7. Vitamin D and B12 deficiency   DVT prophylaxis: SCDs Start: 04/01/20 1053   Family Communication: No family at bedside  Disposition Plan: Patient can likely be discharged tomorrow pending COVID-19 results Status is: Inpatient  Remains inpatient appropriate because:Inpatient level of care appropriate due to severity of illness   Dispo: The patient is from: ALF              Anticipated d/c is to: TBD              Anticipated d/c date is: 1 day              Patient currently is medically stable to d/c.          Pressure injury documentation   Pressure Injury 09/03/19 Buttocks Medial Stage  I -  Intact skin with non-blanchable redness of a localized area usually over a bony prominence. (Active)  09/03/19   Location: Buttocks  Location Orientation: Medial  Staging: Stage I -  Intact skin with non-blanchable redness of a localized area usually over a bony prominence.  Wound Description (Comments):   Present on Admission:     None  Consultants  Palliative  Procedures  None  Antibiotics   Anti-infectives (From admission, onward)   None        Subjective   Awake and conversant today.  Denies any complaints at this time.  No overnight events per bedside nurse. Objective   Vitals:   04/05/20 2259 04/06/20 1413 04/06/20 2133 04/07/20 0549  BP: (!) 153/71 140/85 (!) 170/68 (!) 116/58  Pulse: 70 72 62 61  Resp: 16 14 16 15   Temp: 97.9 F (36.6 C) 98.5 F (36.9 C) 97.8 F (36.6 C) 98.1 F (36.7 C)  TempSrc: Oral  Axillary Axillary  SpO2: 100% 99% 98% 97%  Weight:      Height:        Intake/Output Summary (Last 24 hours) at 04/07/2020 1356 Last data filed at 04/07/2020 0549 Gross per 24 hour  Intake 720 ml  Output 700 ml  Net 20 ml   Filed Weights   04/01/20 2012  Weight: 54.1 kg    Examination:  Physical Exam Vitals and nursing note reviewed.  Constitutional:      Appearance: Normal appearance. He is not ill-appearing.  HENT:     Head: Normocephalic and atraumatic.  Eyes:     Conjunctiva/sclera: Conjunctivae normal.  Cardiovascular:     Rate and Rhythm: Normal rate and regular rhythm.  Pulmonary:     Effort: Pulmonary effort is normal.     Breath sounds: Normal breath sounds.  Abdominal:     General: Abdomen is flat.     Palpations: Abdomen is soft.  Musculoskeletal:        General: No swelling or tenderness.  Skin:    Coloration: Skin is not jaundiced or pale.  Neurological:     Mental Status: He is alert. Mental status is at baseline.  Psychiatric:        Mood and Affect: Mood normal.        Behavior: Behavior normal.     Data Reviewed: I have personally reviewed following labs and imaging studies  CBC: Recent Labs  Lab 04/01/20 0844 04/02/20 0246 04/03/20 0326 04/04/20 0327 04/05/20 0224  WBC 13.3* 12.8* 11.7* 13.7* 12.1*  NEUTROABS 9.0*  --  6.8 8.8* 7.2  HGB 14.2 12.4* 11.3* 11.5* 13.0  HCT 43.5 37.9* 34.8* 34.3* 39.5  MCV 94.2 94.0 96.1 95.5 95.0    PLT 262 222 207 216 378   Basic Metabolic Panel: Recent Labs  Lab 04/01/20 0844 04/02/20 0246 04/03/20 0326 04/04/20 0327 04/05/20 0224  NA 136 136 137 141 140  K 3.7 3.6 3.6 3.8 3.5  CL 99 104 109 108 107  CO2 27 26 27 26 24   GLUCOSE 107* 92 94 108* 87  BUN 21 21 22 19 15   CREATININE 0.98 1.01 0.99 1.08 0.77  CALCIUM 8.8* 8.2* 7.7* 8.0* 8.1*  MG  --   --  2.1 2.0 2.1  PHOS  --   --  2.5 2.7 3.1   GFR: Estimated Creatinine Clearance: 54.5 mL/min (by C-G formula based on SCr of 0.77 mg/dL). Liver Function Tests: Recent Labs  Lab 04/03/20 0326 04/04/20 0327 04/05/20 0224  AST  25 26 28   ALT 16 18 20   ALKPHOS 73 73 77  BILITOT 1.0 1.0 1.2  PROT 5.8* 6.0* 6.7  ALBUMIN 2.9* 3.0* 3.0*   No results for input(s): LIPASE, AMYLASE in the last 168 hours. No results for input(s): AMMONIA in the last 168 hours. Coagulation Profile: Recent Labs  Lab 04/01/20 0844  INR 1.0   Cardiac Enzymes: Recent Labs  Lab 04/01/20 0844 04/02/20 0900 04/04/20 0327  CKTOTAL 540* 455* 118   BNP (last 3 results) No results for input(s): PROBNP in the last 8760 hours. HbA1C: No results for input(s): HGBA1C in the last 72 hours. CBG: No results for input(s): GLUCAP in the last 168 hours. Lipid Profile: No results for input(s): CHOL, HDL, LDLCALC, TRIG, CHOLHDL, LDLDIRECT in the last 72 hours. Thyroid Function Tests: No results for input(s): TSH, T4TOTAL, FREET4, T3FREE, THYROIDAB in the last 72 hours. Anemia Panel: No results for input(s): VITAMINB12, FOLATE, FERRITIN, TIBC, IRON, RETICCTPCT in the last 72 hours. Sepsis Labs: No results for input(s): PROCALCITON, LATICACIDVEN in the last 168 hours.  Recent Results (from the past 240 hour(s))  SARS Coronavirus 2 by RT PCR (hospital order, performed in [facility] hospital lab) Nasopharyngeal Nasopharyngeal Swab     Status: None   Collection Time: 04/01/20  8:44 AM   Specimen: Nasopharyngeal Swab  Result Value Ref Range Status    SARS Coronavirus 2 NEGATIVE NEGATIVE Final    Comment: (NOTE) SARS-CoV-2 target nucleic acids are NOT DETECTED.  The SARS-CoV-2 RNA is generally detectable in upper and lower respiratory specimens during the acute phase of infection. The lowest concentration of SARS-CoV-2 viral copies this assay can detect is 250 copies / mL. A negative result does not preclude SARS-CoV-2 infection and should not be used as the sole basis for treatment or other patient management decisions.  A negative result may occur with improper specimen collection / handling, submission of specimen other than nasopharyngeal swab, presence of viral mutation(s) within the areas targeted by this assay, and inadequate number of viral copies (<250 copies / mL). A negative result must be combined with clinical observations, patient history, and epidemiological information.  Fact Sheet for Patients:   [facility]  Fact Sheet for Healthcare Providers: 04/03/20  This test is not yet approved or  cleared by the [employer] FDA and has been authorized for detection and/or diagnosis of SARS-CoV-2 by FDA under an Emergency Use Authorization (EUA).  This EUA will remain in effect (meaning this test can be used) for the duration of the COVID-19 declaration under Section 564(b)(1) of the Act, 21 U.S.C. section 360bbb-3(b)(1), unless the authorization is terminated or revoked sooner.  Performed at [facility], [address], [place], [place]   SARS Coronavirus 2 by RT PCR (hospital order, performed in [facility] hospital lab) Nasopharyngeal Nasopharyngeal Swab     Status: None   Collection Time: 04/04/20  2:02 PM   Specimen: Nasopharyngeal Swab  Result Value Ref Range Status   SARS Coronavirus 2 NEGATIVE NEGATIVE Final    Comment: (NOTE) SARS-CoV-2 target nucleic acids are NOT DETECTED.  The SARS-CoV-2 RNA is generally  detectable in upper and lower respiratory specimens during the acute phase of infection. The lowest concentration of SARS-CoV-2 viral copies this assay can detect is 250 copies / mL. A negative result does not preclude SARS-CoV-2 infection and should not be used as the sole basis for treatment or other patient management decisions.  A negative result may occur with improper  specimen collection / handling, submission of specimen other than nasopharyngeal swab, presence of viral mutation(s) within the areas targeted by this assay, and inadequate number of viral copies (<250 copies / mL). A negative result must be combined with clinical observations, patient history, and epidemiological information.  Fact Sheet for Patients:   [employer]  Fact Sheet for Healthcare Providers: https://pope.com/  This test is not yet approved or  cleared by the [place] FDA and has been authorized for detection and/or diagnosis of SARS-CoV-2 by FDA under an Emergency Use Authorization (EUA).  This EUA will remain in effect (meaning this test can be used) for the duration of the COVID-19 declaration under Section 564(b)(1) of the Act, 21 U.S.C. section 360bbb-3(b)(1), unless the authorization is terminated or revoked sooner.  Performed at [facility], [address] [place], [place] [address]          Radiology Studies: MR BRAIN WO CONTRAST  Result Date: 04/05/2020 CLINICAL DATA:  Encephalopathy. EXAM: MRI HEAD WITHOUT CONTRAST TECHNIQUE: Multiplanar, multiecho pulse sequences of the brain and surrounding structures were obtained without intravenous contrast. COMPARISON:  Head CT April 01, 2020 FINDINGS: Patient unable to tolerate lying still in the scanner for study. Only scout images obtained which are nondiagnostic. IMPRESSION: Only scout images obtained which are nondiagnostic. Electronically Signed   By: Baldemar Lenis M.D.   On: 04/05/2020 15:09        Scheduled Meds: . acetaminophen  650 mg Oral TID  . cholecalciferol  2,000 Units Oral Daily  . feeding supplement (ENSURE ENLIVE)  237 mL Oral BID BM  . HYDROcodone-acetaminophen  1 tablet Oral QHS  . senna  1 tablet Oral BID  . vitamin B-12  1,000 mcg Oral Daily   Continuous Infusions:   Time spent: 16 minutes with over 50% of the time coordinating the patient's care    Jae Dire, DO [facility] Pager [phone]  Call night coverage person covering after 7pm

## 2020-04-07 NOTE — TOC Progression Note (Addendum)
Transition of Care [facility]) - Progression Note    Patient Details  Name: Kristopher Evans MRN: [id number] Date of Birth: 15-Jun-1935  Transition of Care [facility]) CM/SW Contact  Clearance Coots, LCSW Phone Number: 04/07/2020, 11:11 AM  Clinical Narrative:    CSW reached out to [facility]. CSW spoke with the medical technician Latoya. She reports the patient can return to [facility] with hospice services. The facility uses [facility]. The facility will need an updated FL2 and a updated covid test.   CSW inquired about medicaid assistance for the patient. Latoya reports the facility is only private pay but the facility will help the family transition the resident to another facility. She suggested the niece reach out to the Librarian, academic. CSW reached out to the patient niece and provided an update and provided her with the executive director contact information. Patient niece reports she has started completing the medicaid application online. She is hopeful to have it completed soon.    Physician notified to order a covid test.   CSW reached out to [facility], left voicemail for Chales Abrahams.  Chales Abrahams given a referral. [facility] Will follow up with the patient in community.     Expected Discharge Plan: Skilled Nursing Facility Barriers to Discharge: Continued Medical Work up  Expected Discharge Plan and Services Expected Discharge Plan: Skilled Nursing Facility In-house Referral: Clinical Social Work Discharge Planning Services: CM Consult Post Acute Care Choice: Skilled Nursing Facility Living arrangements for the past 2 months: Assisted Living Facility (Memory Care)                 DME Arranged: N/A DME Agency: NA       HH Arranged: NA HH Agency: NA         Social Determinants of Health (SDOH) Interventions    Readmission Risk Interventions No flowsheet data found.

## 2020-04-07 NOTE — Care Management Important Message (Signed)
Important Message  Patient Details IM Letter given to [name] SW  Case Manager to present to the Patient Name: ATA PECHA MRN: [id number] Date of Birth: 1934/10/21   Medicare Important Message Given:  Yes     Caren Macadam 04/07/2020, 12:13 PM

## 2020-04-07 NOTE — NC FL2 (Addendum)
[place] MEDICAID FL2 LEVEL OF CARE SCREENING TOOL     IDENTIFICATION  Patient Name: Kristopher Evans Birthdate: 1935/01/28 Sex: male Admission Date (Current Location): 04/01/2020  [facility] and [place] Number:  Producer, television/film/video and Address:  [facility],  [address] [place], [place] [address]      Provider Number: [id number]  Attending Physician Name and Address:  Jae Dire, MD  Relative Name and Phone Number:  Graylon Good Niece [phone]    Current Level of Care: Hospital Recommended Level of Care: Skilled Nursing Facility Prior Approval Number:    Date Approved/Denied:   PASRR Number: [id number]  Discharge Plan: SNF    Current Diagnoses: Patient Active Problem List   Diagnosis Date Noted  . Closed left hip fracture (HCC) 04/01/2020  . Vitamin D deficiency 04/01/2020  . Vitamin B12 deficiency 04/01/2020  . Acute respiratory failure with hypoxia (HCC) 09/05/2019  . Hypophosphatemia 09/05/2019  . Pressure injury of skin 09/04/2019  . Hypokalemia 09/04/2019  . Normocytic anemia 09/04/2019  . Neck abscess 09/02/2019  . Dementia (HCC) 09/02/2019  . HTN (hypertension) 09/02/2019  . Leucocytosis 09/02/2019    Orientation RESPIRATION BLADDER Height & Weight     Self  Normal Incontinent Weight: 119 lb 4.3 oz (54.1 kg) Height:  5\' 6"  (167.6 cm)  BEHAVIORAL SYMPTOMS/MOOD NEUROLOGICAL BOWEL NUTRITION STATUS      Incontinent Diet (Heart Healthy)  AMBULATORY STATUS COMMUNICATION OF NEEDS Skin   Extensive Assist Verbally PU Stage and Appropriate Care PU Stage 1 Dressing:  (Pressure Wound , Medial Stage I. Buttocks. Incontinent.)                     Personal Care Assistance Level of Assistance  Bathing, Feeding, Dressing Bathing Assistance: Maximum assistance Feeding assistance: Maximum assistance Dressing Assistance: Maximum assistance     Functional Limitations Info  Sight, Hearing, Speech Sight Info: Impaired Hearing Info:  Adequate Speech Info: Adequate    SPECIAL CARE FACTORS FREQUENCY                  Contractures Contractures Info: Not present    Additional Factors Info  Code Status, Allergies, Psychotropic Code Status Info: DNR Allergies Info: Allergies: Penicillins Psychotropic Info: Trazadone         Current Medications (04/07/2020):   Medication List        STOP taking these medications       traZODone 50 MG tablet Commonly known as: DESYREL             TAKE these medications       aspirin EC 81 MG tablet Take 81 mg by mouth daily.   liver oil-zinc oxide 40 % ointment Commonly known as: DESITIN Apply 1 application topically 2 (two) times daily as needed for irritation.   moxifloxacin 0.5 % ophthalmic solution Commonly known as: Vigamox 1gtt to eyes, bilaterally, q2hours while awake for 2 days, THEN q6hrs for 5 days.   triamcinolone cream 0.1 % Commonly known as: KENALOG Apply 1 application topically 3 (three) times daily as needed (rash).   vitamin B-12 1000 MCG tablet Commonly known as: CYANOCOBALAMIN Take 1,000 mcg by mouth daily.   Vitamin D 50 MCG (2000 UT) Caps Take 2,000 Units by mouth daily.                        Discharge Care Instructions  (From admission, onward)  Start     Ordered    04/08/20 0000  Discharge wound care:       Comments: Decrease pressure on wound   04/08/20 1326            Discharge Medications: Please see discharge summary for a list of discharge medications.  Relevant Imaging Results:  Relevant Lab Results:   Additional Information SSN [id number], Hospice Services to [facility].  Lia Hopping, LCSW

## 2020-04-07 NOTE — Progress Notes (Addendum)
Patent examiner [facility]) Hospital Liaison: RN note     Notified by Transition of Care Manger [name], CSW of patient/family request for [facility] services at [facility] after discharge. Chart and patient information under review by [facility] physician. Hospice eligibility confirmed.     Writer spoke with niece, Dois Davenport to initiate education related to hospice philosophy, services and team approach to care.  Dois Davenport verbalized understanding of information given.   Please send signed and completed DNR form home with patient/family. Patient will need prescriptions for discharge comfort medications.         [facility] aware of the above. Please notify [facility] when patient is ready to leave the unit at discharge. (Call [phone] or [phone] after 5pm.) ACC information and contact numbers given to  [facility].       Please call with any hospice related questions.      Thank you for this referral.     Elsie Saas, RN, [facility] (listed on AMION under Hospice [facility])   [phone]

## 2020-04-08 NOTE — TOC Transition Note (Addendum)
Transition of Care [facility]) - CM/SW Discharge Note   Patient Details  Name: Kristopher Evans MRN: [id number] Date of Birth: 1935/05/17  Transition of Care [facility]) CM/SW Contact:  Clearance Coots, LCSW Phone Number: 04/08/2020, 1:49 PM   Clinical Narrative:    Patient will return to [facility] with Hospice services.  [facility] placed orders for hospital bed and Wheelchair. Per Rep, Melissa there maybe a delay in delivery. She will keep the hospital staff updated.  [facility] to transport.  FL2 completed/D/C Summary  faxed to [phone].  Nurse call report to: [phone] DNR signed   Final next level of care: Skilled Nursing Facility Barriers to Discharge: Equipment Delay   Patient Goals and CMS Choice   CMS Medicare.gov Compare Post Acute Care list provided to:: Patient Choice offered to / list presented to : Patient  Discharge Placement                Patient to be transferred to facility by: [facility] Name of family member notified: Niece Sandy Patient and family notified of of transfer: 04/08/20  Discharge Plan and Services In-house Referral: Clinical Social Work Discharge Planning Services: [employer] Consult Post Acute Care Choice: Skilled Nursing Facility          DME Arranged: Wheelchair manual, Hospital bed DME Agency: [facility] Date DME Agency Contacted: 04/08/20 Time DME Agency Contacted: 1300 Representative spoke with at DME Agency: Efraim Kaufmann HH Arranged: NA HH Agency: [facility] Date [facility] Agency Contacted: 04/06/20 Time HH Agency Contacted: 1347 Representative spoke with at [facility] Agency: Chales Abrahams  Social Determinants of Health (SDOH) Interventions     Readmission Risk Interventions No flowsheet data found.

## 2020-04-08 NOTE — Discharge Summary (Signed)
Physician Discharge Summary  Kristopher Evans WUJ:[id number] DOB: 07-22-1935   PCP: Patient, No Pcp Per  Admit date: 04/01/2020 Discharge date: 04/08/2020 Length of Stay: 7 days   Code Status: DNR  Admitted From: [facility] Discharged to: [facility] with hospice Discharge Condition:  Stable, poor prognosis   Hospital Summary  This is an 85 year old male with history of dementia, hypertension, hyperlipidemia, PVD, psoriasis, vitamin D and B12 deficiencies who presented to the ED with left hip pain after an unwitnessed fall that was thought to have occurred on 6/17.  Per EMS and staff at his facility there was concern for possible UTI as the patient had been more confused than his baseline as of recently.  ED Course:Temp 99.0,hypertensive at 178/84 with otherwise normal vitals. CBC notable for leukocytosis 13.3k.BMP unremarkable except for very mild hyperglycemia. CT head was negative for acute findings, showed old lacunar infarcts, atrophy and chronic small vessel disease. CT cervical spine was negative for fractures, showed mild degenerative changes. Left hip x-ray was read as negative for fracture, therefore CT hip was obtained which does show comminuted fracture of the greater trochanter and possibly intertrochanteric as well.   Discussed with orthopedist Dr. Rodolph Bong recommended initially admitting for pain control and attempt PT evaluation prior to proceeding with surgery however after further evaluation of CT there was inconclusive evidence of fracture and Ortho recommended to have the patient mobilized with PT and weight-bear as tolerated. PT initially recommended SNF however standing/gait was deferred secondary to patient's limited participation and lack of WBAT. TOC consulted and found SNF placement however insurance authorization was denied, prior hospitalist called for peer-to-peer without success. Unfortunately, the patient was still not medically stable to be discharged and  was increasingly and extremely somnolent and more drowsy and with less interaction over several days.  Palliative care was consulted who recommended comfort care and hospice and refrain from surgery and other work-up.    A & P   Principal Problem:   Closed left hip fracture (HCC) Active Problems:   Dementia (HCC)   HTN (hypertension)   Leucocytosis   Vitamin D deficiency   Vitamin B12 deficiency    1. Closed left hip fracture s/p unwitnessed fall at SNF a. No surgery at this time, no additional infectious or neurologic work-up b. Palliative care consulted: Recommending comfort measures and hospice c. Discharge with hospice services  2. Reactive leukocytosis  3. Hypertension, stable  4. Hypocalcemia Received calcium gluconate 1 g IV x1  5. Dilutional anemia, resolved  6. Advanced dementia with somnolence, drowsiness and confusion a. Comfort measures and hospice services at discharge  7. Vitamin D and B12 deficiency    Consultants  . Palliative care  Procedures  . None  Antibiotics   Anti-infectives (From admission, onward)   None       Subjective  Sleeping comfortably when I presented to the bedside.  This a.m. he was a bit agitated and required Ativan.  Patient was able to wake up for lunch and was able to tolerate diet without any further agitation.  No other issues overnight   Objective   Discharge Exam: Vitals:   04/07/20 2019 04/08/20 0517  BP: (!) 177/81 (!) 162/65  Pulse: 79 71  Resp: 18 16  Temp: 98.1 F (36.7 C) 97.8 F (36.6 C)  SpO2: 96% 100%   Vitals:   04/07/20 0549 04/07/20 1415 04/07/20 2019 04/08/20 0517  BP: (!) 116/58 (!) 148/75 (!) 177/81 (!) 162/65  Pulse: 61 88 79 71  Resp: 15 18 18 16   Temp: 98.1 F (36.7 C) 98.8 F (37.1 C) 98.1 F (36.7 C) 97.8 F (36.6 C)  TempSrc: Axillary  Axillary Oral  SpO2: 97% 96% 96% 100%  Weight:      Height:        Physical Exam Vitals and nursing note reviewed.   Constitutional:      General: He is not in acute distress.    Appearance: He is not toxic-appearing.  HENT:     Head: Normocephalic.  Cardiovascular:     Rate and Rhythm: Normal rate and regular rhythm.  Pulmonary:     Effort: No respiratory distress.     Breath sounds: No wheezing.  Abdominal:     General: There is no distension.  Musculoskeletal:        General: No swelling or tenderness.       The results of significant diagnostics from this hospitalization (including imaging, microbiology, ancillary and laboratory) are listed below for reference.     Microbiology: Recent Results (from the past 240 hour(s))  SARS Coronavirus 2 by RT PCR (hospital order, performed in [facility] lab) Nasopharyngeal Nasopharyngeal Swab     Status: None   Collection Time: 04/01/20  8:44 AM   Specimen: Nasopharyngeal Swab  Result Value Ref Range Status   SARS Coronavirus 2 NEGATIVE NEGATIVE Final    Comment: (NOTE) SARS-CoV-2 target nucleic acids are NOT DETECTED.  The SARS-CoV-2 RNA is generally detectable in upper and lower respiratory specimens during the acute phase of infection. The lowest concentration of SARS-CoV-2 viral copies this assay can detect is 250 copies / mL. A negative result does not preclude SARS-CoV-2 infection and should not be used as the sole basis for treatment or other patient management decisions.  A negative result may occur with improper specimen collection / handling, submission of specimen other than nasopharyngeal swab, presence of viral mutation(s) within the areas targeted by this assay, and inadequate number of viral copies (<250 copies / mL). A negative result must be combined with clinical observations, patient history, and epidemiological information.  Fact Sheet for Patients:   BoilerBrush.com.cyhttps://www.fda.gov/media/136312/download  Fact Sheet for Healthcare Providers: https://pope.com/https://www.fda.gov/media/136313/download  This test is not yet approved or   cleared by the [place] FDA and has been authorized for detection and/or diagnosis of SARS-CoV-2 by FDA under an Emergency Use Authorization (EUA).  This EUA will remain in effect (meaning this test can be used) for the duration of the COVID-19 declaration under Section 564(b)(1) of the Act, 21 U.S.C. section 360bbb-3(b)(1), unless the authorization is terminated or revoked sooner.  Performed at [facility], [address] [place], [place] [id number]   SARS Coronavirus 2 by RT PCR (hospital order, performed in [facility] hospital lab) Nasopharyngeal Nasopharyngeal Swab     Status: None   Collection Time: 04/04/20  2:02 PM   Specimen: Nasopharyngeal Swab  Result Value Ref Range Status   SARS Coronavirus 2 NEGATIVE NEGATIVE Final    Comment: (NOTE) SARS-CoV-2 target nucleic acids are NOT DETECTED.  The SARS-CoV-2 RNA is generally detectable in upper and lower respiratory specimens during the acute phase of infection. The lowest concentration of SARS-CoV-2 viral copies this assay can detect is 250 copies / mL. A negative result does not preclude SARS-CoV-2 infection and should not be used as the sole basis for treatment or other patient management decisions.  A negative result may occur with improper specimen collection / handling, submission of specimen other than nasopharyngeal  swab, presence of viral mutation(s) within the areas targeted by this assay, and inadequate number of viral copies (<250 copies / mL). A negative result must be combined with clinical observations, patient history, and epidemiological information.  Fact Sheet for Patients:   BoilerBrush.com.cy  Fact Sheet for Healthcare Providers: https://pope.com/  This test is not yet approved or  cleared by the [place] FDA and has been authorized for detection and/or diagnosis of SARS-CoV-2 by FDA under an Emergency Use Authorization (EUA).   This EUA will remain in effect (meaning this test can be used) for the duration of the COVID-19 declaration under Section 564(b)(1) of the Act, 21 U.S.C. section 360bbb-3(b)(1), unless the authorization is terminated or revoked sooner.  Performed at [facility], [address] [place], [place] [address]   SARS CORONAVIRUS 2 (TAT 6-24 HRS) Nasopharyngeal Nasopharyngeal Swab     Status: None   Collection Time: 04/07/20 11:51 AM   Specimen: Nasopharyngeal Swab  Result Value Ref Range Status   SARS Coronavirus 2 NEGATIVE NEGATIVE Final    Comment: (NOTE) SARS-CoV-2 target nucleic acids are NOT DETECTED.  The SARS-CoV-2 RNA is generally detectable in upper and lower respiratory specimens during the acute phase of infection. Negative results do not preclude SARS-CoV-2 infection, do not rule out co-infections with other pathogens, and should not be used as the sole basis for treatment or other patient management decisions. Negative results must be combined with clinical observations, patient history, and epidemiological information. The expected result is Negative.  Fact Sheet for Patients: HairSlick.no  Fact Sheet for Healthcare Providers: quierodirigir.com  This test is not yet approved or cleared by the [place] FDA and  has been authorized for detection and/or diagnosis of SARS-CoV-2 by FDA under an Emergency Use Authorization (EUA). This EUA will remain  in effect (meaning this test can be used) for the duration of the COVID-19 declaration under Se ction 564(b)(1) of the Act, 21 U.S.C. section 360bbb-3(b)(1), unless the authorization is terminated or revoked sooner.  Performed at [facility], [address], [place], [place] [address]      Labs: BNP (last 3 results) Recent Labs    09/13/19 0903  BNP 80.9   Basic Metabolic Panel: Recent Labs  Lab 04/02/20 0246 04/03/20 0326  04/04/20 0327 04/05/20 0224  NA 136 137 141 140  K 3.6 3.6 3.8 3.5  CL 104 109 108 107  CO2 26 27 26 24   GLUCOSE 92 94 108* 87  BUN 21 22 19 15   CREATININE 1.01 0.99 1.08 0.77  CALCIUM 8.2* 7.7* 8.0* 8.1*  MG  --  2.1 2.0 2.1  PHOS  --  2.5 2.7 3.1   Liver Function Tests: Recent Labs  Lab 04/03/20 0326 04/04/20 0327 04/05/20 0224  AST 25 26 28   ALT 16 18 20   ALKPHOS 73 73 77  BILITOT 1.0 1.0 1.2  PROT 5.8* 6.0* 6.7  ALBUMIN 2.9* 3.0* 3.0*   No results for input(s): LIPASE, AMYLASE in the last 168 hours. No results for input(s): AMMONIA in the last 168 hours. CBC: Recent Labs  Lab 04/02/20 0246 04/03/20 0326 04/04/20 0327 04/05/20 0224  WBC 12.8* 11.7* 13.7* 12.1*  NEUTROABS  --  6.8 8.8* 7.2  HGB 12.4* 11.3* 11.5* 13.0  HCT 37.9* 34.8* 34.3* 39.5  MCV 94.0 96.1 95.5 95.0  PLT 222 207 216 213   Cardiac Enzymes: Recent Labs  Lab 04/02/20 0900 04/04/20 0327  CKTOTAL 455* 118   BNP: Invalid  input(s): POCBNP CBG: No results for input(s): GLUCAP in the last 168 hours. D-Dimer No results for input(s): DDIMER in the last 72 hours. Hgb A1c No results for input(s): HGBA1C in the last 72 hours. Lipid Profile No results for input(s): CHOL, HDL, LDLCALC, TRIG, CHOLHDL, LDLDIRECT in the last 72 hours. Thyroid function studies No results for input(s): TSH, T4TOTAL, T3FREE, THYROIDAB in the last 72 hours.  Invalid input(s): FREET3 Anemia work up No results for input(s): VITAMINB12, FOLATE, FERRITIN, TIBC, IRON, RETICCTPCT in the last 72 hours. Urinalysis    Component Value Date/Time   COLORURINE YELLOW 04/01/2020 1211   APPEARANCEUR CLEAR 04/01/2020 1211   LABSPEC 1.021 04/01/2020 1211   PHURINE 6.0 04/01/2020 1211   GLUCOSEU NEGATIVE 04/01/2020 1211   HGBUR NEGATIVE 04/01/2020 1211   BILIRUBINUR NEGATIVE 04/01/2020 1211   KETONESUR 20 (A) 04/01/2020 1211   PROTEINUR 30 (A) 04/01/2020 1211   NITRITE NEGATIVE 04/01/2020 1211   LEUKOCYTESUR NEGATIVE  04/01/2020 1211   Sepsis Labs Invalid input(s): PROCALCITONIN,  WBC,  LACTICIDVEN Microbiology Recent Results (from the past 240 hour(s))  SARS Coronavirus 2 by RT PCR (hospital order, performed in [facility] hospital lab) Nasopharyngeal Nasopharyngeal Swab     Status: None   Collection Time: 04/01/20  8:44 AM   Specimen: Nasopharyngeal Swab  Result Value Ref Range Status   SARS Coronavirus 2 NEGATIVE NEGATIVE Final    Comment: (NOTE) SARS-CoV-2 target nucleic acids are NOT DETECTED.  The SARS-CoV-2 RNA is generally detectable in upper and lower respiratory specimens during the acute phase of infection. The lowest concentration of SARS-CoV-2 viral copies this assay can detect is 250 copies / mL. A negative result does not preclude SARS-CoV-2 infection and should not be used as the sole basis for treatment or other patient management decisions.  A negative result may occur with improper specimen collection / handling, submission of specimen other than nasopharyngeal swab, presence of viral mutation(s) within the areas targeted by this assay, and inadequate number of viral copies (<250 copies / mL). A negative result must be combined with clinical observations, patient history, and epidemiological information.  Fact Sheet for Patients:   BoilerBrush.com.cy  Fact Sheet for Healthcare Providers: https://pope.com/  This test is not yet approved or  cleared by the [place] FDA and has been authorized for detection and/or diagnosis of SARS-CoV-2 by FDA under an Emergency Use Authorization (EUA).  This EUA will remain in effect (meaning this test can be used) for the duration of the COVID-19 declaration under Section 564(b)(1) of the Act, 21 U.S.C. section 360bbb-3(b)(1), unless the authorization is terminated or revoked sooner.  Performed at [facility], [address], [place], [place] [address]   SARS  Coronavirus 2 by RT PCR (hospital order, performed in [facility] hospital lab) Nasopharyngeal Nasopharyngeal Swab     Status: None   Collection Time: 04/04/20  2:02 PM   Specimen: Nasopharyngeal Swab  Result Value Ref Range Status   SARS Coronavirus 2 NEGATIVE NEGATIVE Final    Comment: (NOTE) SARS-CoV-2 target nucleic acids are NOT DETECTED.  The SARS-CoV-2 RNA is generally detectable in upper and lower respiratory specimens during the acute phase of infection. The lowest concentration of SARS-CoV-2 viral copies this assay can detect is 250 copies / mL. A negative result does not preclude SARS-CoV-2 infection and should not be used as the sole basis for treatment or other patient management decisions.  A negative result may occur with improper specimen collection / handling, submission of  specimen other than nasopharyngeal swab, presence of viral mutation(s) within the areas targeted by this assay, and inadequate number of viral copies (<250 copies / mL). A negative result must be combined with clinical observations, patient history, and epidemiological information.  Fact Sheet for Patients:   BoilerBrush.com.cy  Fact Sheet for Healthcare Providers: https://pope.com/  This test is not yet approved or  cleared by the [place] FDA and has been authorized for detection and/or diagnosis of SARS-CoV-2 by FDA under an Emergency Use Authorization (EUA).  This EUA will remain in effect (meaning this test can be used) for the duration of the COVID-19 declaration under Section 564(b)(1) of the Act, 21 U.S.C. section 360bbb-3(b)(1), unless the authorization is terminated or revoked sooner.  Performed at [facility], [address], [place], [place] [address]   SARS CORONAVIRUS 2 (TAT 6-24 HRS) Nasopharyngeal Nasopharyngeal Swab     Status: None   Collection Time: 04/07/20 11:51 AM   Specimen: Nasopharyngeal Swab   Result Value Ref Range Status   SARS Coronavirus 2 NEGATIVE NEGATIVE Final    Comment: (NOTE) SARS-CoV-2 target nucleic acids are NOT DETECTED.  The SARS-CoV-2 RNA is generally detectable in upper and lower respiratory specimens during the acute phase of infection. Negative results do not preclude SARS-CoV-2 infection, do not rule out co-infections with other pathogens, and should not be used as the sole basis for treatment or other patient management decisions. Negative results must be combined with clinical observations, patient history, and epidemiological information. The expected result is Negative.  Fact Sheet for Patients: HairSlick.no  Fact Sheet for Healthcare Providers: quierodirigir.com  This test is not yet approved or cleared by the [place] FDA and  has been authorized for detection and/or diagnosis of SARS-CoV-2 by FDA under an Emergency Use Authorization (EUA). This EUA will remain  in effect (meaning this test can be used) for the duration of the COVID-19 declaration under Se ction 564(b)(1) of the Act, 21 U.S.C. section 360bbb-3(b)(1), unless the authorization is terminated or revoked sooner.  Performed at [facility], [address] [place], [place] [address]     Discharge Instructions     Discharge Instructions    Diet - low sodium heart healthy   Complete by: As directed    Discharge instructions   Complete by: As directed    - Hospice services at discharge - Stop taking Trazodone   Discharge wound care:   Complete by: As directed    Decrease pressure on wound   Increase activity slowly   Complete by: As directed    No wound care   Complete by: As directed      Allergies as of 04/08/2020      Reactions   Penicillins    Not listed on [facility]      Medication List    STOP taking these medications   traZODone 50 MG tablet Commonly known as: DESYREL     TAKE these  medications   aspirin EC 81 MG tablet Take 81 mg by mouth daily.   liver oil-zinc oxide 40 % ointment Commonly known as: DESITIN Apply 1 application topically 2 (two) times daily as needed for irritation.   moxifloxacin 0.5 % ophthalmic solution Commonly known as: Vigamox 1gtt to eyes, bilaterally, q2hours while awake for 2 days, THEN q6hrs for 5 days.   triamcinolone cream 0.1 % Commonly known as: KENALOG Apply 1 application topically 3 (three) times daily as needed (rash).   vitamin B-12 1000  MCG tablet Commonly known as: CYANOCOBALAMIN Take 1,000 mcg by mouth daily.   Vitamin D 50 MCG (2000 UT) Caps Take 2,000 Units by mouth daily.            Discharge Care Instructions  (From admission, onward)         Start     Ordered   04/08/20 0000  Discharge wound care:       Comments: Decrease pressure on wound   04/08/20 1326          Contact information for after-discharge care    Destination    [facility] .   Service: Skilled Nursing Contact information: [address] [address] [phone]                 Allergies  Allergen Reactions  . Penicillins     Not listed on MAR    Dispo: The patient is from: ALF              Anticipated d/c is to: ALF              Anticipated d/c date is:Today              Patient currently is medically stable to d/c.       Time coordinating discharge: Over 30 minutes   SIGNED:   Harold Hedge, D.O. [facility] Pager: [phone]  04/08/2020, 1:33 PM

## 2020-04-08 NOTE — Progress Notes (Signed)
Patent examiner [facility]) Hospital Liaison: RN note     Notified by Transition of Care Manger [name], CSW of patient/family request for [facility] services at [facility] after discharge. Chart and patient information under review by [facility] physician. Hospice eligibility confirmed.     Writer spoke with niece, Dois Davenport to initiate education related to hospice philosophy, services and team approach to care. DME has been ordered for [facility]: Bed and Wheelchair needed per [employer]. Niece has been updated on DME.   Please call with any hospice related questions.    Yolande Jolly, RN, [facility] [phone]

## 2020-04-09 MED ORDER — QUETIAPINE FUMARATE 25 MG PO TABS
25.0000 mg | ORAL_TABLET | Freq: Every day | ORAL | Status: DC
  Administered 2020-04-09 – 2020-04-10 (×2): 25 mg via ORAL
  Filled 2020-04-09 (×2): qty 1

## 2020-04-09 NOTE — Progress Notes (Deleted)
DME and Bed have been delivered to the patient's facility. He is ready for discharge to facility with hospice services. No changes to discharge summary from 6/25.   Thank you, Whitney Post, DO

## 2020-04-09 NOTE — Progress Notes (Signed)
PROGRESS NOTE    Kristopher Evans    Code Status: DNR  JKK:[id number] DOB: [birth year] DOA: 04/01/2020 LOS: 8 days  PCP: Patient, No Pcp Per CC:  Chief Complaint  Patient presents with  . [facility] Summary   This is an 85 year old male with history of dementia, hypertension, hyperlipidemia, PVD, psoriasis, vitamin D and B12 deficiencies who presented to the ED with left hip pain after an unwitnessed fall that was thought to have occurred on 6/17.  Per EMS and staff at his facility there was concern for possible UTI as the patient had been more confused than his baseline as of recently.  ED Course:Temp 99.0,hypertensive at 178/84 with otherwise normal vitals. CBC notable for leukocytosis 13.3k.BMP unremarkable except for very mild hyperglycemia. CT head was negative for acute findings, showed old lacunar infarcts, atrophy and chronic small vessel disease. CT cervical spine was negative for fractures, showed mild degenerative changes. Left hip x-ray was read as negative for fracture, therefore CT hip was obtained which does show comminuted fracture of the greater trochanter and possibly intertrochanteric as well. Discussed with orthopedist Dr. Molli Hazard recommended initially admitting for pain control and attempt PT evaluation prior to proceeding with surgery however after further evaluation of CT there was inconclusive evidence of fracture and Ortho recommended to see the patient mobilized with PT and weight-bear as tolerated.  Unfortunately, the patient was still not medically stable to be discharged and was increasingly and extremely somnolent and more drowsy and with less interaction over the past several days.  Palliative care was consulted who recommended comfort care and hospice  Prolonged hospitalization due to waiting for DME/Bed to be delivered at accepting facility.   A & P   Principal Problem:   Closed left hip fracture (HCC) Active Problems:   Dementia (HCC)   HTN  (hypertension)   Leucocytosis   Vitamin D deficiency   Vitamin B12 deficiency   1. Closed left hip fracture s/p unwitnessed fall at SNF a. PT initially recommended SNF however standing/gait was deferred secondary to patient's limited participation and lack of WBAT b. TOC consulted and found SNF placement however insurance authorization was denied, prior hospitalist called for peer-to-peer without success. c. No surgery at this time, no additional infectious or neurologic work-up d. Palliative care consulted: Recommending comfort measures and hospice e. TOC team consulted, appreciate assistance with placement f. Initially planned for discharge on 6/25, however discharge is still pending DME/Bed delivery to accepting facility  2. Hospital Acquired Delirium a. Hold benzos b. Delirium precations c. Seroquel nightly d. Would benefit from being discharged back to a familiar place  3. Fall from bed a. Notified by nursing on 6/26 that the patient had an unwitnessed fall from his bed after his bed alarm did not go off b. No injuries noted c. Patient was placed back in bed safely with a different bed alarm  4. Reactive leukocytosis  5. Hypertension a. Continue Prn hydralazine  6. Hypocalcemia a. Received calcium gluconate 1 g IV x1  7. Dilutional anemia, resolved  8. Advanced dementia with somnolence, drowsiness and confusion a. Unfortunately he has continued to decline throughout his hospitalization though he is stable today despite med rec b. Comfort measures and hospice services at discharge  9. Vitamin D and B12 deficiency   DVT prophylaxis: SCDs Start: 04/01/20 1053   Family Communication: No family at bedside  Disposition Plan: Discharge pending DME/Bed delivery to accepting facility.  Status is: Inpatient  Remains inpatient appropriate because:Inpatient level of care appropriate due to severity of illness   Dispo: The patient is from: ALF              Anticipated  d/c is to: ALF              Anticipated d/c date is: 1 day              Patient currently is medically stable to d/c.          Pressure injury documentation   Pressure Injury 09/03/19 Buttocks Medial Stage I -  Intact skin with non-blanchable redness of a localized area usually over a bony prominence. (Active)  09/03/19   Location: Buttocks  Location Orientation: Medial  Staging: Stage I -  Intact skin with non-blanchable redness of a localized area usually over a bony prominence.  Wound Description (Comments):   Present on Admission:    None  Consultants  Palliative  Procedures  None  Antibiotics   Anti-infectives (From admission, onward)   None        Subjective   Resting comfortably with bedside sitter helping patient eat. He is smiling and laughing and in good spirits.   Notified by the bedside nurse that the patient had an unwitnessed fall from his bed today after his bed alarm did not go off to alert staff. He did not suffer any injuries and was placed safely and securely back in bed with a different alarm.  Objective   Vitals:   04/09/20 0614 04/09/20 1004 04/09/20 1205 04/09/20 1353  BP: (!) 155/73 136/73 (!) 141/92 (!) 170/63  Pulse: 67 75 (!) 104 70  Resp: 15 17 18 17   Temp: 98.9 F (37.2 C) 97.9 F (36.6 C) (!) 97.5 F (36.4 C) 97.6 F (36.4 C)  TempSrc:  Oral  Oral  SpO2: 99% 98% 100% 95%  Weight:      Height:        Intake/Output Summary (Last 24 hours) at 04/09/2020 1546 Last data filed at 04/09/2020 1300 Gross per 24 hour  Intake 680 ml  Output 1300 ml  Net -620 ml   Filed Weights   04/01/20 2012  Weight: 54.1 kg    Examination:  Physical Exam Vitals and nursing note reviewed. Exam conducted with a chaperone present.  Constitutional:      General: He is not in acute distress.    Appearance: He is not toxic-appearing.  HENT:     Head: Normocephalic.  Cardiovascular:     Rate and Rhythm: Normal rate and regular rhythm.    Abdominal:     General: There is no distension.  Musculoskeletal:        General: No swelling or tenderness.  Neurological:     Mental Status: He is alert. He is disoriented.     Data Reviewed: I have personally reviewed following labs and imaging studies  CBC: Recent Labs  Lab 04/03/20 0326 04/04/20 0327 04/05/20 0224  WBC 11.7* 13.7* 12.1*  NEUTROABS 6.8 8.8* 7.2  HGB 11.3* 11.5* 13.0  HCT 34.8* 34.3* 39.5  MCV 96.1 95.5 95.0  PLT 207 216 213   Basic Metabolic Panel: Recent Labs  Lab 04/03/20 0326 04/04/20 0327 04/05/20 0224  NA 137 141 140  K 3.6 3.8 3.5  CL 109 108 107  CO2 27 26 24   GLUCOSE 94 108* 87  BUN 22 19 15   CREATININE 0.99 1.08 0.77  CALCIUM 7.7* 8.0* 8.1*  MG 2.1 2.0 2.1  PHOS 2.5 2.7 3.1   GFR: Estimated Creatinine Clearance: 54.5 mL/min (by C-G formula based on SCr of 0.77 mg/dL). Liver Function Tests: Recent Labs  Lab 04/03/20 0326 04/04/20 0327 04/05/20 0224  AST 25 26 28   ALT 16 18 20   ALKPHOS 73 73 77  BILITOT 1.0 1.0 1.2  PROT 5.8* 6.0* 6.7  ALBUMIN 2.9* 3.0* 3.0*   No results for input(s): LIPASE, AMYLASE in the last 168 hours. No results for input(s): AMMONIA in the last 168 hours. Coagulation Profile: No results for input(s): INR, PROTIME in the last 168 hours. Cardiac Enzymes: Recent Labs  Lab 04/04/20 0327  CKTOTAL 118   BNP (last 3 results) No results for input(s): PROBNP in the last 8760 hours. HbA1C: No results for input(s): HGBA1C in the last 72 hours. CBG: No results for input(s): GLUCAP in the last 168 hours. Lipid Profile: No results for input(s): CHOL, HDL, LDLCALC, TRIG, CHOLHDL, LDLDIRECT in the last 72 hours. Thyroid Function Tests: No results for input(s): TSH, T4TOTAL, FREET4, T3FREE, THYROIDAB in the last 72 hours. Anemia Panel: No results for input(s): VITAMINB12, FOLATE, FERRITIN, TIBC, IRON, RETICCTPCT in the last 72 hours. Sepsis Labs: No results for input(s): PROCALCITON, LATICACIDVEN in the  last 168 hours.  Recent Results (from the past 240 hour(s))  SARS Coronavirus 2 by RT PCR (hospital order, performed in [facility] hospital lab) Nasopharyngeal Nasopharyngeal Swab     Status: None   Collection Time: 04/01/20  8:44 AM   Specimen: Nasopharyngeal Swab  Result Value Ref Range Status   SARS Coronavirus 2 NEGATIVE NEGATIVE Final    Comment: (NOTE) SARS-CoV-2 target nucleic acids are NOT DETECTED.  The SARS-CoV-2 RNA is generally detectable in upper and lower respiratory specimens during the acute phase of infection. The lowest concentration of SARS-CoV-2 viral copies this assay can detect is 250 copies / mL. A negative result does not preclude SARS-CoV-2 infection and should not be used as the sole basis for treatment or other patient management decisions.  A negative result may occur with improper specimen collection / handling, submission of specimen other than nasopharyngeal swab, presence of viral mutation(s) within the areas targeted by this assay, and inadequate number of viral copies (<250 copies / mL). A negative result must be combined with clinical observations, patient history, and epidemiological information.  Fact Sheet for Patients:   [facility]  Fact Sheet for Healthcare Providers: 04/03/20  This test is not yet approved or  cleared by the [employer] FDA and has been authorized for detection and/or diagnosis of SARS-CoV-2 by FDA under an Emergency Use Authorization (EUA).  This EUA will remain in effect (meaning this test can be used) for the duration of the COVID-19 declaration under Section 564(b)(1) of the Act, 21 U.S.C. section 360bbb-3(b)(1), unless the authorization is terminated or revoked sooner.  Performed at [facility], [address], [place], [place]   SARS Coronavirus 2 by RT PCR (hospital order, performed in [facility] hospital lab)  Nasopharyngeal Nasopharyngeal Swab     Status: None   Collection Time: 04/04/20  2:02 PM   Specimen: Nasopharyngeal Swab  Result Value Ref Range Status   SARS Coronavirus 2 NEGATIVE NEGATIVE Final    Comment: (NOTE) SARS-CoV-2 target nucleic acids are NOT DETECTED.  The SARS-CoV-2 RNA is generally detectable in upper and lower respiratory specimens during the acute phase of infection. The lowest concentration of SARS-CoV-2 viral copies this assay can detect is 250 copies / mL. A negative  result does not preclude SARS-CoV-2 infection and should not be used as the sole basis for treatment or other patient management decisions.  A negative result may occur with improper specimen collection / handling, submission of specimen other than nasopharyngeal swab, presence of viral mutation(s) within the areas targeted by this assay, and inadequate number of viral copies (<250 copies / mL). A negative result must be combined with clinical observations, patient history, and epidemiological information.  Fact Sheet for Patients:   [employer]  Fact Sheet for Healthcare Providers: https://pope.com/  This test is not yet approved or  cleared by the [place] FDA and has been authorized for detection and/or diagnosis of SARS-CoV-2 by FDA under an Emergency Use Authorization (EUA).  This EUA will remain in effect (meaning this test can be used) for the duration of the COVID-19 declaration under Section 564(b)(1) of the Act, 21 U.S.C. section 360bbb-3(b)(1), unless the authorization is terminated or revoked sooner.  Performed at [facility], [address] [place], [place] [address]   SARS CORONAVIRUS 2 (TAT 6-24 HRS) Nasopharyngeal Nasopharyngeal Swab     Status: None   Collection Time: 04/07/20 11:51 AM   Specimen: Nasopharyngeal Swab  Result Value Ref Range Status   SARS Coronavirus 2 NEGATIVE NEGATIVE Final     Comment: (NOTE) SARS-CoV-2 target nucleic acids are NOT DETECTED.  The SARS-CoV-2 RNA is generally detectable in upper and lower respiratory specimens during the acute phase of infection. Negative results do not preclude SARS-CoV-2 infection, do not rule out co-infections with other pathogens, and should not be used as the sole basis for treatment or other patient management decisions. Negative results must be combined with clinical observations, patient history, and epidemiological information. The expected result is Negative.  Fact Sheet for Patients: [employer]  Fact Sheet for Healthcare Providers: quierodirigir.com  This test is not yet approved or cleared by the [place] FDA and  has been authorized for detection and/or diagnosis of SARS-CoV-2 by FDA under an Emergency Use Authorization (EUA). This EUA will remain  in effect (meaning this test can be used) for the duration of the COVID-19 declaration under Se ction 564(b)(1) of the Act, 21 U.S.C. section 360bbb-3(b)(1), unless the authorization is terminated or revoked sooner.  Performed at [facility], [address], [place], [place] [address]          Radiology Studies: No results found.      Scheduled Meds: . acetaminophen  650 mg Oral TID  . cholecalciferol  2,000 Units Oral Daily  . feeding supplement (ENSURE ENLIVE)  237 mL Oral BID BM  . HYDROcodone-acetaminophen  1 tablet Oral QHS  . senna  1 tablet Oral BID  . vitamin B-12  1,000 mcg Oral Daily   Continuous Infusions:   Time spent: 20 minutes with over 50% of the time coordinating the patient's care    Jae Dire, DO [facility] Pager [phone]  Call night coverage person covering after 7pm

## 2020-04-09 NOTE — TOC Progression Note (Signed)
Transition of Care [facility]) - Progression Note    Patient Details  Name: Kristopher Evans MRN: [id number] Date of Birth: 06/22/1935  [facility] CM/SW Contact  Amada Jupiter, LCSW Phone Number: 04/09/2020, 5:03 PM  Clinical Narrative:   Hospital bed STILL not delivered to [facility].  Palliative Care team member working on the issue.  Pt cannot transfer until bed is there.  MD aware.    Expected Discharge Plan: Skilled Nursing Facility Barriers to Discharge: Equipment Delay  Expected Discharge Plan and Services Expected Discharge Plan: Skilled Nursing Facility In-house Referral: Clinical Social Work Discharge Planning Services: CM Consult Post Acute Care Choice: Skilled Nursing Facility Living arrangements for the past 2 months: Assisted Living Facility (Memory Care) Expected Discharge Date: 04/10/20               DME Arranged: Wheelchair manual, Hospital bed DME Agency: [facility] Date DME Agency Contacted: 04/08/20 Time DME Agency Contacted: 1300 Representative spoke with at DME Agency: Efraim Kaufmann HH Arranged: NA HH Agency: [facility] Date [facility] Agency Contacted: 04/06/20 Time HH Agency Contacted: 1347 Representative spoke with at [facility] Agency: Chales Abrahams   Social Determinants of Health (SDOH) Interventions    Readmission Risk Interventions No flowsheet data found.

## 2020-04-10 NOTE — Progress Notes (Signed)
Civil engineer, contracting [facility]) Hospital Liaison   Notified by Transition of Care MangerNicole Gaye Pollack, [place] patient/family request for [facility] services at [facility] discharge. Chart and patient information under review by [facility] physician. Hospice eligibilityconfirmed  Spoke with niece Dois Davenport about equipment not being delivered to [facility]. I am unable to reach anyone at [facility] today about the delivery of bed and wheelchair. [facility] will follow up in the morning as sometimes on the weekend they are hard to get in touch with.   Please call with any hospice related questions.  Yolande Jolly, RN, [facility] [phone]

## 2020-04-10 NOTE — Progress Notes (Signed)
PROGRESS NOTE    AQUIL DUHE    Code Status: DNR  QMV:[id number] DOB: 1935/04/03 DOA: 04/01/2020 LOS: 9 days  PCP: Patient, No Pcp Per CC:  Chief Complaint  Patient presents with  . [facility] Summary   This is an 85 year old male with history of dementia, hypertension, hyperlipidemia, PVD, psoriasis, vitamin D and B12 deficiencies who presented to the ED with left hip pain after an unwitnessed fall that was thought to have occurred on 6/17.  Per EMS and staff at his facility there was concern for possible UTI as the patient had been more confused than his baseline as of recently.  ED Course:Temp 99.0,hypertensive at 178/84 with otherwise normal vitals. CBC notable for leukocytosis 13.3k.BMP unremarkable except for very mild hyperglycemia. CT head was negative for acute findings, showed old lacunar infarcts, atrophy and chronic small vessel disease. CT cervical spine was negative for fractures, showed mild degenerative changes. Left hip x-ray was read as negative for fracture, therefore CT hip was obtained which does show comminuted fracture of the greater trochanter and possibly intertrochanteric as well. Discussed with orthopedist Dr. Molli Hazard recommended initially admitting for pain control and attempt PT evaluation prior to proceeding with surgery however after further evaluation of CT there was inconclusive evidence of fracture and Ortho recommended to see the patient mobilized with PT and weight-bear as tolerated.  Unfortunately, the patient was still not medically stable to be discharged and was increasingly and extremely somnolent and more drowsy and with less interaction over the past several days.  Palliative care was consulted who recommended comfort care and hospice  Prolonged hospitalization due to waiting for DME/Bed to be delivered at accepting facility.   A & P   Principal Problem:   Closed left hip fracture (HCC) Active Problems:   Dementia (HCC)   HTN  (hypertension)   Leucocytosis   Vitamin D deficiency   Vitamin B12 deficiency   1. Closed left hip fracture s/p unwitnessed fall at SNF a. PT initially recommended SNF however standing/gait was deferred secondary to patient's limited participation and lack of WBAT b. TOC consulted and found SNF placement however insurance authorization was denied, prior hospitalist called for peer-to-peer without success. c. No surgery at this time, no additional infectious or neurologic work-up d. Palliative care consulted: Recommending comfort measures and hospice e. TOC team consulted, appreciate assistance with placement f. Initially planned for discharge on 6/25, however discharge is still pending DME/Bed delivery to accepting facility  2. Hospital Acquired Delirium a. Hold benzos b. Delirium precations c. Seroquel nightly d. Would benefit from being discharged back to a familiar place  3. Fall from bed a. Notified by nursing on 6/26 that the patient had an unwitnessed fall from his bed after his bed alarm did not go off, No injuries noted, Patient was placed back in bed safely with a different bed alarm  4. Reactive leukocytosis  5. Hypertension a. Continue Prn hydralazine  6. Hypocalcemia a. Received calcium gluconate 1 g IV x1  7. Dilutional anemia, resolved  8. Advanced dementia with somnolence, drowsiness and confusion a. Unfortunately he has continued to decline throughout his hospitalization though he is stable today despite med rec b. Comfort measures and hospice services at discharge  9. Vitamin D and B12 deficiency   DVT prophylaxis: SCDs Start: 04/01/20 1053   Family Communication: discussed with Niece over the phone  Disposition Plan: Discussed with Melissa from palliative who has not been able to get  in contact with anyone regarding the status of this bed probably since it is the weekend. Hopefully discharge tomorrow pending DME/Bed delivery to accepting  facility.  Status is: Inpatient  Remains inpatient appropriate because:Inpatient level of care appropriate due to severity of illness   Dispo: The patient is from: ALF              Anticipated d/c is to: ALF              Anticipated d/c date is: 1 day              Patient currently is medically stable to d/c.          Pressure injury documentation   Pressure Injury 09/03/19 Buttocks Medial Stage I -  Intact skin with non-blanchable redness of a localized area usually over a bony prominence. (Active)  09/03/19   Location: Buttocks  Location Orientation: Medial  Staging: Stage I -  Intact skin with non-blanchable redness of a localized area usually over a bony prominence.  Wound Description (Comments):   Present on Admission:    None  Consultants  Palliative  Procedures  None  Antibiotics   Anti-infectives (From admission, onward)   None        Subjective   Sleeping and arousable to verbal stimuli.  Patient unable to provide history, No issues overnight.  Objective   Vitals:   04/09/20 1353 04/09/20 2201 04/10/20 0523 04/10/20 1407  BP: (!) 170/63 (!) 154/81 (!) 175/73 137/73  Pulse: 70 64 65 65  Resp: 17 14 15 17   Temp: 97.6 F (36.4 C) (!) 97.5 F (36.4 C) 97.7 F (36.5 C) (!) 97 F (36.1 C)  TempSrc: Oral Oral Oral   SpO2: 95% 97% 97% 97%  Weight:      Height:        Intake/Output Summary (Last 24 hours) at 04/10/2020 1452 Last data filed at 04/10/2020 1300 Gross per 24 hour  Intake 120 ml  Output 700 ml  Net -580 ml   Filed Weights   04/01/20 2012  Weight: 54.1 kg    Examination:  Physical Exam Vitals and nursing note reviewed.  Constitutional:      Appearance: Normal appearance.  HENT:     Head: Normocephalic and atraumatic.  Cardiovascular:     Rate and Rhythm: Normal rate and regular rhythm.  Pulmonary:     Effort: Pulmonary effort is normal.     Breath sounds: Normal breath sounds.  Abdominal:     General: Abdomen is  flat.     Palpations: Abdomen is soft.  Musculoskeletal:        General: No swelling or tenderness.  Skin:    Coloration: Skin is not jaundiced or pale.  Neurological:     Mental Status: He is alert. Mental status is at baseline.     Data Reviewed: I have personally reviewed following labs and imaging studies  CBC: Recent Labs  Lab 04/04/20 0327 04/05/20 0224  WBC 13.7* 12.1*  NEUTROABS 8.8* 7.2  HGB 11.5* 13.0  HCT 34.3* 39.5  MCV 95.5 95.0  PLT 216 213   Basic Metabolic Panel: Recent Labs  Lab 04/04/20 0327 04/05/20 0224  NA 141 140  K 3.8 3.5  CL 108 107  CO2 26 24  GLUCOSE 108* 87  BUN 19 15  CREATININE 1.08 0.77  CALCIUM 8.0* 8.1*  MG 2.0 2.1  PHOS 2.7 3.1   GFR: Estimated Creatinine Clearance: 54.5 mL/min (by C-G formula based  on SCr of 0.77 mg/dL). Liver Function Tests: Recent Labs  Lab 04/04/20 0327 04/05/20 0224  AST 26 28  ALT 18 20  ALKPHOS 73 77  BILITOT 1.0 1.2  PROT 6.0* 6.7  ALBUMIN 3.0* 3.0*   No results for input(s): LIPASE, AMYLASE in the last 168 hours. No results for input(s): AMMONIA in the last 168 hours. Coagulation Profile: No results for input(s): INR, PROTIME in the last 168 hours. Cardiac Enzymes: Recent Labs  Lab 04/04/20 0327  CKTOTAL 118   BNP (last 3 results) No results for input(s): PROBNP in the last 8760 hours. HbA1C: No results for input(s): HGBA1C in the last 72 hours. CBG: No results for input(s): GLUCAP in the last 168 hours. Lipid Profile: No results for input(s): CHOL, HDL, LDLCALC, TRIG, CHOLHDL, LDLDIRECT in the last 72 hours. Thyroid Function Tests: No results for input(s): TSH, T4TOTAL, FREET4, T3FREE, THYROIDAB in the last 72 hours. Anemia Panel: No results for input(s): VITAMINB12, FOLATE, FERRITIN, TIBC, IRON, RETICCTPCT in the last 72 hours. Sepsis Labs: No results for input(s): PROCALCITON, LATICACIDVEN in the last 168 hours.  Recent Results (from the past 240 hour(s))  SARS Coronavirus 2  by RT PCR (hospital order, performed in [facility] hospital lab) Nasopharyngeal Nasopharyngeal Swab     Status: None   Collection Time: 04/01/20  8:44 AM   Specimen: Nasopharyngeal Swab  Result Value Ref Range Status   SARS Coronavirus 2 NEGATIVE NEGATIVE Final    Comment: (NOTE) SARS-CoV-2 target nucleic acids are NOT DETECTED.  The SARS-CoV-2 RNA is generally detectable in upper and lower respiratory specimens during the acute phase of infection. The lowest concentration of SARS-CoV-2 viral copies this assay can detect is 250 copies / mL. A negative result does not preclude SARS-CoV-2 infection and should not be used as the sole basis for treatment or other patient management decisions.  A negative result may occur with improper specimen collection / handling, submission of specimen other than nasopharyngeal swab, presence of viral mutation(s) within the areas targeted by this assay, and inadequate number of viral copies (<250 copies / mL). A negative result must be combined with clinical observations, patient history, and epidemiological information.  Fact Sheet for Patients:   BoilerBrush.com.cy  Fact Sheet for Healthcare Providers: https://pope.com/  This test is not yet approved or  cleared by the [place] FDA and has been authorized for detection and/or diagnosis of SARS-CoV-2 by FDA under an Emergency Use Authorization (EUA).  This EUA will remain in effect (meaning this test can be used) for the duration of the COVID-19 declaration under Section 564(b)(1) of the Act, 21 U.S.C. section 360bbb-3(b)(1), unless the authorization is terminated or revoked sooner.  Performed at [facility], [address] [place], [place] [address]   SARS Coronavirus 2 by RT PCR (hospital order, performed in [facility] hospital lab) Nasopharyngeal Nasopharyngeal Swab     Status: None   Collection Time: 04/04/20  2:02 PM    Specimen: Nasopharyngeal Swab  Result Value Ref Range Status   SARS Coronavirus 2 NEGATIVE NEGATIVE Final    Comment: (NOTE) SARS-CoV-2 target nucleic acids are NOT DETECTED.  The SARS-CoV-2 RNA is generally detectable in upper and lower respiratory specimens during the acute phase of infection. The lowest concentration of SARS-CoV-2 viral copies this assay can detect is 250 copies / mL. A negative result does not preclude SARS-CoV-2 infection and should not be used as the sole basis for treatment or other patient management decisions.  A  negative result may occur with improper specimen collection / handling, submission of specimen other than nasopharyngeal swab, presence of viral mutation(s) within the areas targeted by this assay, and inadequate number of viral copies (<250 copies / mL). A negative result must be combined with clinical observations, patient history, and epidemiological information.  Fact Sheet for Patients:   BoilerBrush.com.cy  Fact Sheet for Healthcare Providers: https://pope.com/  This test is not yet approved or  cleared by the [place] FDA and has been authorized for detection and/or diagnosis of SARS-CoV-2 by FDA under an Emergency Use Authorization (EUA).  This EUA will remain in effect (meaning this test can be used) for the duration of the COVID-19 declaration under Section 564(b)(1) of the Act, 21 U.S.C. section 360bbb-3(b)(1), unless the authorization is terminated or revoked sooner.  Performed at [facility], [address], [place], [place] [address]   SARS CORONAVIRUS 2 (TAT 6-24 HRS) Nasopharyngeal Nasopharyngeal Swab     Status: None   Collection Time: 04/07/20 11:51 AM   Specimen: Nasopharyngeal Swab  Result Value Ref Range Status   SARS Coronavirus 2 NEGATIVE NEGATIVE Final    Comment: (NOTE) SARS-CoV-2 target nucleic acids are NOT DETECTED.  The SARS-CoV-2 RNA is  generally detectable in upper and lower respiratory specimens during the acute phase of infection. Negative results do not preclude SARS-CoV-2 infection, do not rule out co-infections with other pathogens, and should not be used as the sole basis for treatment or other patient management decisions. Negative results must be combined with clinical observations, patient history, and epidemiological information. The expected result is Negative.  Fact Sheet for Patients: HairSlick.no  Fact Sheet for Healthcare Providers: quierodirigir.com  This test is not yet approved or cleared by the [place] FDA and  has been authorized for detection and/or diagnosis of SARS-CoV-2 by FDA under an Emergency Use Authorization (EUA). This EUA will remain  in effect (meaning this test can be used) for the duration of the COVID-19 declaration under Se ction 564(b)(1) of the Act, 21 U.S.C. section 360bbb-3(b)(1), unless the authorization is terminated or revoked sooner.  Performed at [facility], [address], [place], [place] [address]          Radiology Studies: No results found.      Scheduled Meds: . acetaminophen  650 mg Oral TID  . cholecalciferol  2,000 Units Oral Daily  . feeding supplement (ENSURE ENLIVE)  237 mL Oral BID BM  . HYDROcodone-acetaminophen  1 tablet Oral QHS  . QUEtiapine  25 mg Oral QHS  . senna  1 tablet Oral BID  . vitamin B-12  1,000 mcg Oral Daily   Continuous Infusions:   Time spent: 16 minutes with over 50% of the time coordinating the patient's care    Jae Dire, DO [facility] Pager [phone]  Call night coverage person covering after 7pm

## 2020-04-11 NOTE — Progress Notes (Signed)
PTAR here to get patient. I attempted to call report again using provided number by the SW, no one answered.

## 2020-04-11 NOTE — Care Management Important Message (Signed)
Important Message  Patient Details IM Letter given to [name] SW Case Manager to present to the Patient Name: Kristopher Evans MRN: [id number] Date of Birth: October 06, 1935   Medicare Important Message Given:  Yes     Caren Macadam 04/11/2020, 12:50 PM

## 2020-04-11 NOTE — Progress Notes (Signed)
Patient seen and examined at bedside, no acute distress, resting comfortably.   Bed has been delivered. Plan for discharge to [facility] today with hospice services.  Medically stable for discharge. No changes to discharge summary from 04/08/20.   Whitney Post, DO [facility]

## 2020-04-11 NOTE — TOC Transition Note (Signed)
Transition of Care [facility]) - CM/SW Discharge Note   Patient Details  Name: BENECIO KLUGER MRN: [id number] Date of Birth: 04-Sep-1935  Transition of Care [facility]) CM/SW Contact:  Clearance Coots, LCSW Phone Number: 04/11/2020, 11:51 AM   Clinical Narrative:    [facility] Re. Chales Abrahams reached out DME company this am, she was notified Hospital bed will be delivered today.   CSW reached out to resident coordinator Melissa. She confirm the hospital bed has been delivered.   Nurse call report to: [phone]  PTAR to transport.    Final next level of care: Skilled Nursing Facility Barriers to Discharge: No Barriers Identified   Patient Goals and CMS Choice   CMS Medicare.gov Compare Post Acute Care list provided to:: Patient Choice offered to / list presented to : Patient  Discharge Placement                Patient to be transferred to facility by: PTAR Name of family member notified: Niece Sandy Patient and family notified of of transfer: 04/08/20  Discharge Plan and Services In-house Referral: Clinical Social Work Discharge Planning Services: [employer] Consult Post Acute Care Choice: Skilled Nursing Facility          DME Arranged: Wheelchair manual, Hospital bed DME Agency: [facility] Date DME Agency Contacted: 04/08/20 Time DME Agency Contacted: 1300 Representative spoke with at DME Agency: Efraim Kaufmann HH Arranged: NA HH Agency: [facility] Date [facility] Agency Contacted: 04/06/20 Time HH Agency Contacted: 1347 Representative spoke with at [facility] Agency: Chales Abrahams  Social Determinants of Health (SDOH) Interventions     Readmission Risk Interventions No flowsheet data found.

## 2020-04-11 NOTE — Progress Notes (Signed)
Attempted to call report to [facility] at [phone], and again at 67. No answer.

## 2020-04-12 ENCOUNTER — Emergency Department (HOSPITAL_COMMUNITY): Payer: PPO

## 2020-04-12 ENCOUNTER — Other Ambulatory Visit: Payer: Self-pay

## 2020-04-12 ENCOUNTER — Emergency Department (HOSPITAL_COMMUNITY)
Admission: EM | Admit: 2020-04-12 | Discharge: 2020-04-12 | Disposition: A | Discharge status: Skilled Nursing Facility | Payer: PPO | Attending: Emergency Medicine | Admitting: Emergency Medicine

## 2020-04-12 DIAGNOSIS — Y999 Unspecified external cause status: Secondary | ICD-10-CM | POA: Insufficient documentation

## 2020-04-12 DIAGNOSIS — I10 Essential (primary) hypertension: Secondary | ICD-10-CM | POA: Diagnosis not present

## 2020-04-12 DIAGNOSIS — F0390 Unspecified dementia without behavioral disturbance: Secondary | ICD-10-CM | POA: Insufficient documentation

## 2020-04-12 DIAGNOSIS — W19XXXA Unspecified fall, initial encounter: Secondary | ICD-10-CM | POA: Insufficient documentation

## 2020-04-12 DIAGNOSIS — S0990XA Unspecified injury of head, initial encounter: Secondary | ICD-10-CM

## 2020-04-12 DIAGNOSIS — M2550 Pain in unspecified joint: Secondary | ICD-10-CM | POA: Diagnosis not present

## 2020-04-12 DIAGNOSIS — S0001XA Abrasion of scalp, initial encounter: Secondary | ICD-10-CM | POA: Insufficient documentation

## 2020-04-12 DIAGNOSIS — R69 Illness, unspecified: Secondary | ICD-10-CM | POA: Diagnosis not present

## 2020-04-12 DIAGNOSIS — R9082 White matter disease, unspecified: Secondary | ICD-10-CM | POA: Diagnosis not present

## 2020-04-12 DIAGNOSIS — Y939 Activity, unspecified: Secondary | ICD-10-CM | POA: Insufficient documentation

## 2020-04-12 DIAGNOSIS — Z7982 Long term (current) use of aspirin: Secondary | ICD-10-CM | POA: Insufficient documentation

## 2020-04-12 DIAGNOSIS — Y929 Unspecified place or not applicable: Secondary | ICD-10-CM | POA: Diagnosis not present

## 2020-04-12 DIAGNOSIS — Z7401 Bed confinement status: Secondary | ICD-10-CM | POA: Diagnosis not present

## 2020-04-12 DIAGNOSIS — R5381 Other malaise: Secondary | ICD-10-CM | POA: Diagnosis not present

## 2020-04-12 DIAGNOSIS — S199XXA Unspecified injury of neck, initial encounter: Secondary | ICD-10-CM | POA: Diagnosis not present

## 2020-04-12 DIAGNOSIS — T148XXA Other injury of unspecified body region, initial encounter: Secondary | ICD-10-CM

## 2020-04-12 NOTE — Discharge Instructions (Signed)
Continue the medications as previously prescribed. Follow-up with your primary care provider. Return to the ER for worsening pain, chest pain, injuries or falls.

## 2020-04-12 NOTE — ED Notes (Signed)
Patient transported to CT 

## 2020-04-12 NOTE — ED Notes (Addendum)
Patient arrives via [facility] via [place] after a fall today.  Patient has a left lateral posterior laceration, bleeding controlled with pressure.  Patient alert and oriented x1, appears to be his baseline.  Facility denies loc and GCS with EMS was 13.  Patient had another recent fall and may possibly have a fractured left hip.  C-collar in place.  Yellow DNR form with patient.

## 2020-04-12 NOTE — ED Provider Notes (Signed)
[facility]-EMERGENCY DEPT Provider Note   CSN: [id number] Arrival date & time: 04/12/20  1514     History Chief Complaint  Patient presents with  . Fall  . Head Laceration    Kristopher Evans is a 85 y.o. male with a past medical history of dementia, hypertension, HLD, with recent admission, discharge 3 days ago for possible fracture of the left greater trochanter, treated with PT and weightbearing as tolerated, transfer to an SNF with palliative care consult presenting to the ED after fall that occurred prior to arrival.  History is provided by caretaker at SNF over the phone. States that patient was told he is nonweightbearing but attempted to ambulate today and hit his head on the table. He states that he is at his mental baseline secondary to his dementia. Denies any recent vomiting or other illness.  HPI     Past Medical History:  Diagnosis Date  . Dementia (HCC)   . Hyperlipidemia   . Hypertension   . Psoriasis   . Vitamin B 12 deficiency   . Vitamin D deficiency     Patient Active Problem List   Diagnosis Date Noted  . Closed left hip fracture (HCC) 04/01/2020  . Vitamin D deficiency 04/01/2020  . Vitamin B12 deficiency 04/01/2020  . Acute respiratory failure with hypoxia (HCC) 09/05/2019  . Hypophosphatemia 09/05/2019  . Pressure injury of skin 09/04/2019  . Hypokalemia 09/04/2019  . Normocytic anemia 09/04/2019  . Neck abscess 09/02/2019  . Dementia (HCC) 09/02/2019  . HTN (hypertension) 09/02/2019  . Leucocytosis 09/02/2019    Past Surgical History:  Procedure Laterality Date  . MINOR IRRIGATION AND DEBRIDEMENT OF WOUND Left 09/04/2019   Procedure: IRRIGATION AND DEBRIDEMENT OF NECK ABCESS;  Surgeon: Osborn Coho, MD;  Location: [facility] ORS;  Service: ENT;  Laterality: Left;       No family history on file.  Social History   Tobacco Use  . Smoking status: Never Smoker  . Smokeless tobacco: Never Used  Substance Use Topics  .  Alcohol use: Yes  . Drug use: Not on file    Home Medications Prior to Admission medications   Medication Sig Start Date End Date Taking? Authorizing Provider  aspirin EC 81 MG tablet Take 81 mg by mouth daily.    [provider]  Cholecalciferol (VITAMIN D) 2000 units CAPS Take 2,000 Units by mouth daily.    [provider]  liver oil-zinc oxide (DESITIN) 40 % ointment Apply 1 application topically 2 (two) times daily as needed for irritation.     [provider]  moxifloxacin (VIGAMOX) 0.5 % ophthalmic solution 1gtt to eyes, bilaterally, q2hours while awake for 2 days, THEN q6hrs for 5 days. Patient not taking: Reported on 09/02/2019 06/24/18   Myrna Blazer, MD  triamcinolone cream (KENALOG) 0.1 % Apply 1 application topically 3 (three) times daily as needed (rash).     [provider]  vitamin B-12 (CYANOCOBALAMIN) 1000 MCG tablet Take 1,000 mcg by mouth daily.    [provider]    Allergies    Penicillins  Review of Systems   Review of Systems  Unable to perform ROS: Dementia    Physical Exam Updated Vital Signs BP 128/69   Pulse 91   Temp 97.7 F (36.5 C)   Resp 20   Ht 5\' 6"  (1.676 m)   Wt 55 kg   SpO2 99%   BMI 19.57 kg/m   Physical Exam Vitals and nursing note  reviewed.  Constitutional:      General: He is not in acute distress.    Appearance: He is well-developed.  HENT:     Head: Normocephalic and atraumatic.     Nose: Nose normal.  Eyes:     General: No scleral icterus.       Right eye: No discharge.        Left eye: No discharge.     Conjunctiva/sclera: Conjunctivae normal.     Pupils: Pupils are equal, round, and reactive to light.  Cardiovascular:     Rate and Rhythm: Normal rate and regular rhythm.     Heart sounds: Normal heart sounds. No murmur heard.  No friction rub. No gallop.   Pulmonary:     Effort: Pulmonary effort is normal. No respiratory distress.     Breath sounds: Normal  breath sounds.  Abdominal:     General: Bowel sounds are normal. There is no distension.     Palpations: Abdomen is soft.     Tenderness: There is no abdominal tenderness. There is no guarding.  Musculoskeletal:        General: Normal range of motion.     Cervical back: Normal range of motion and neck supple.     Comments: Moving extremities without difficulty.  Skin:    General: Skin is warm and dry.     Findings: No rash.     Comments: Abrasion to R posterior scalp.  Neurological:     Mental Status: He is alert.     Motor: No abnormal muscle tone.     Coordination: Coordination normal.     ED Results / Procedures / Treatments   Labs (all labs ordered are listed, but only abnormal results are displayed) Labs Reviewed - No data to display  EKG None  Radiology CT Head Wo Contrast  Result Date: 04/12/2020 CLINICAL DATA:  Fall EXAM: CT HEAD WITHOUT CONTRAST CT CERVICAL SPINE WITHOUT CONTRAST TECHNIQUE: Multidetector CT imaging of the head and cervical spine was performed following the standard protocol without intravenous contrast. Multiplanar CT image reconstructions of the cervical spine were also generated. COMPARISON:  04/01/2020 FINDINGS: CT HEAD FINDINGS Brain: No evidence of acute infarction, hemorrhage, hydrocephalus, extra-axial collection or mass lesion/mass effect. Periventricular and deep white matter hypodensity, with redemonstrated multifocal encephalomalacia, including of the left frontal lobe, right caudate, and left cerebellar hemisphere. Vascular: No hyperdense vessel or unexpected calcification. Skull: Normal. Negative for fracture or focal lesion. Sinuses/Orbits: No acute finding. Other: None. CT CERVICAL SPINE FINDINGS Alignment: Normal. Skull base and vertebrae: No acute fracture. No primary bone lesion or focal pathologic process. Soft tissues and spinal canal: No prevertebral fluid or swelling. No visible canal hematoma. Disc levels: Mild to moderate multilevel  disc space height loss and osteophytosis. Upper chest: Negative. Other: None. IMPRESSION: 1. No acute intracranial pathology. 2. Small-vessel white matter disease and redemonstrated multifocal encephalomalacia. 3. No fracture or static subluxation of the cervical spine. 4. Mild to moderate multilevel disc space height loss and osteophytosis. Electronically Signed   By: Lauralyn PrimesAlex  Bibbey M.D.   On: 04/12/2020 16:56   CT Cervical Spine Wo Contrast  Result Date: 04/12/2020 CLINICAL DATA:  Fall EXAM: CT HEAD WITHOUT CONTRAST CT CERVICAL SPINE WITHOUT CONTRAST TECHNIQUE: Multidetector CT imaging of the head and cervical spine was performed following the standard protocol without intravenous contrast. Multiplanar CT image reconstructions of the cervical spine were also generated. COMPARISON:  04/01/2020 FINDINGS: CT HEAD FINDINGS Brain: No evidence of acute infarction, hemorrhage, hydrocephalus,  extra-axial collection or mass lesion/mass effect. Periventricular and deep white matter hypodensity, with redemonstrated multifocal encephalomalacia, including of the left frontal lobe, right caudate, and left cerebellar hemisphere. Vascular: No hyperdense vessel or unexpected calcification. Skull: Normal. Negative for fracture or focal lesion. Sinuses/Orbits: No acute finding. Other: None. CT CERVICAL SPINE FINDINGS Alignment: Normal. Skull base and vertebrae: No acute fracture. No primary bone lesion or focal pathologic process. Soft tissues and spinal canal: No prevertebral fluid or swelling. No visible canal hematoma. Disc levels: Mild to moderate multilevel disc space height loss and osteophytosis. Upper chest: Negative. Other: None. IMPRESSION: 1. No acute intracranial pathology. 2. Small-vessel white matter disease and redemonstrated multifocal encephalomalacia. 3. No fracture or static subluxation of the cervical spine. 4. Mild to moderate multilevel disc space height loss and osteophytosis. Electronically Signed   By:  Lauralyn Primes M.D.   On: 04/12/2020 16:56    Procedures Procedures (including critical care time)  Medications Ordered in ED Medications - No data to display  ED Course  I have reviewed the triage vital signs and the nursing notes.  Pertinent labs & imaging results that were available during my care of the patient were reviewed by me and considered in my medical decision making (see chart for details).    MDM Rules/Calculators/A&P                          85 year old male with past medical history of dementia, hypertension, hyperlipidemia with recent admission for possible fracture of the left hip treated with PT and weightbearing as tolerated, discharged to SNF with palliative care consult presenting to the ED for fall that occurred prior to arrival.  Staff over the phone at SNF states that he was told to not ambulate.  However he tried to ambulate and then fell and hit the back of his head on a table.  States that he is at his mental baseline.  Denies any preceding illness or loss of consciousness.  He has a skin abrasion noted on the posterior scalp without laceration.  This was cleaned.  He appears to be moving extremities without difficulty.  Equal grip strength noted.  However remainder of physical exam is limited as he is not willing to participate.  Pupils are equal and reactive.  CT of the head and cervical spine showed no acute abnormalities.  Patient remains hemodynamically stable.  Will be transferred back to facility with PCP follow-up. Patient discussed with and seen by the attending, Dr. Lynelle Doctor.  Patient is hemodynamically stable, in NAD, and able to ambulate in the ED. Evaluation does not show pathology that would require ongoing emergent intervention or inpatient treatment. I explained the diagnosis to the patient. Pain has been managed and has no complaints prior to discharge. Patient is comfortable with above plan and is stable for discharge at this time. All questions were  answered prior to disposition. Strict return precautions for returning to the ED were discussed. Encouraged follow up with PCP.   An After Visit Summary was printed and given to the patient.   Portions of this note were generated with Scientist, clinical (histocompatibility and immunogenetics). Dictation errors may occur despite best attempts at proofreading.  Final Clinical Impression(s) / ED Diagnoses Final diagnoses:  Injury of head, initial encounter  Fall, initial encounter  Skin abrasion    Rx / DC Orders ED Discharge Orders    None       Dietrich Pates, PA-C 04/12/20 1709  Linwood Dibbles, MD 04/13/20 [phone]

## 2020-04-12 NOTE — ED Notes (Signed)
[facility] contacted for transport back to facility  

## 2020-04-12 NOTE — ED Notes (Signed)
Attempted to give report to [facility]. No answer x 3.

## 2020-05-15 DEATH — deceased

## 2022-05-18 IMAGING — CT CT CERVICAL SPINE W/O CM
4 of 7 series · 14 of 33 positions shown, 15 images · non-contrast
Comparison: 04/01/2020

CLINICAL DATA: Fall

EXAM:
CT HEAD WITHOUT CONTRAST
CT CERVICAL SPINE WITHOUT CONTRAST
TECHNIQUE: Multidetector CT imaging of the head and cervical spine was
performed following the standard protocol without intravenous
contrast. Multiplanar CT image reconstructions of the cervical spine
were also generated.

[Series 9: c spine soft · axial · 0.35mm/px · z∈[-206,-92]mm · 4 of 95 slices shown]
[im 19/95  soft-tissue]
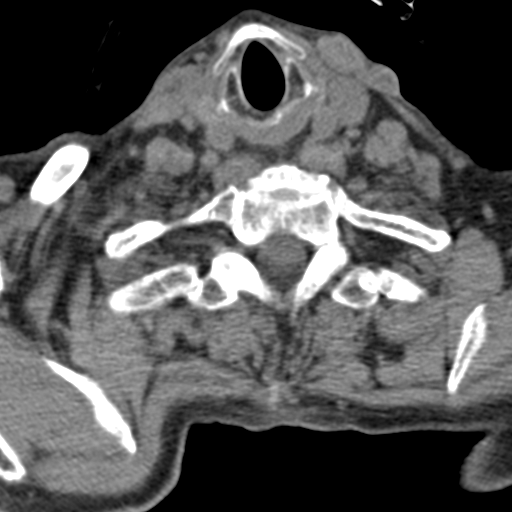
[im 38/95  soft-tissue]
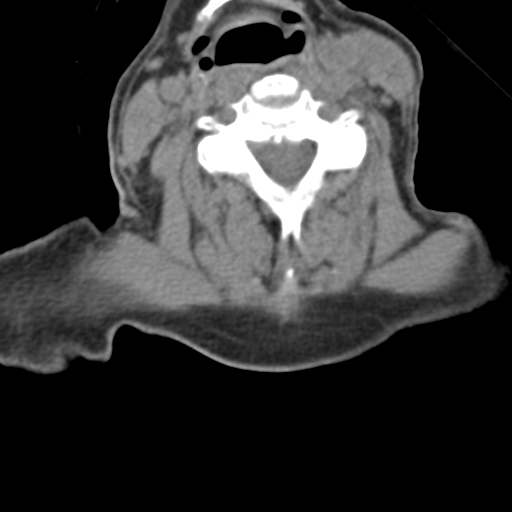
[im 57/95  soft-tissue]
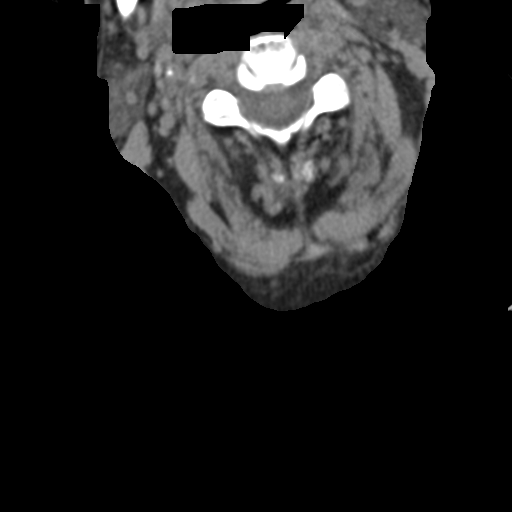
[im 76/95  soft-tissue]
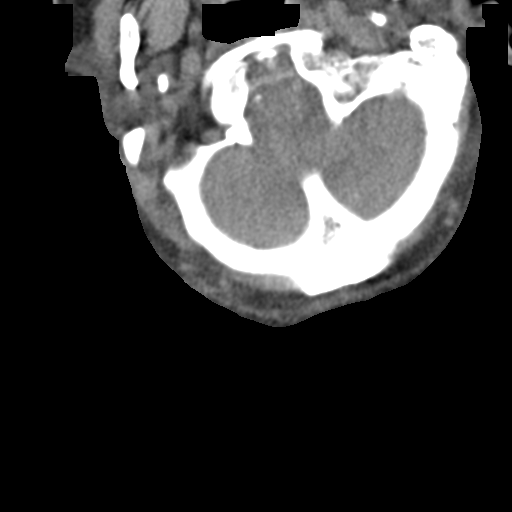

[Series 10: orthogonal bone · axial · 0.23mm/px · z∈[-229,-134]mm · 4 of 92 slices shown, 5 images]
[im 19/92  soft-tissue]
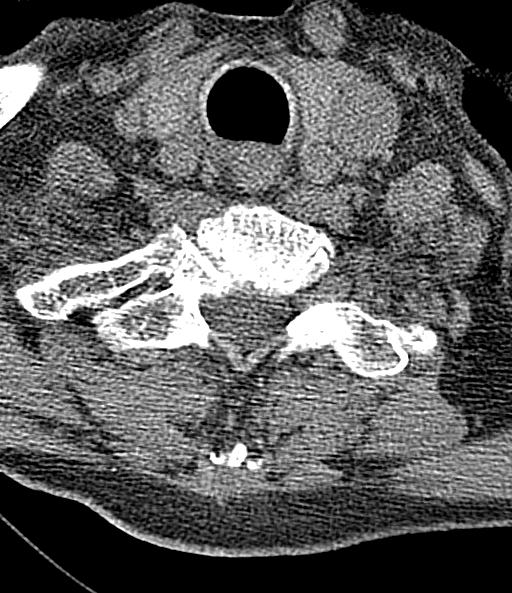
[im 19/92  bone]
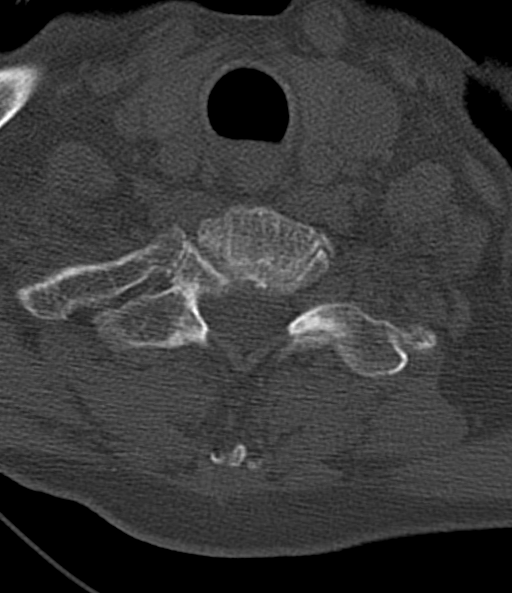
[im 37/92  bone]
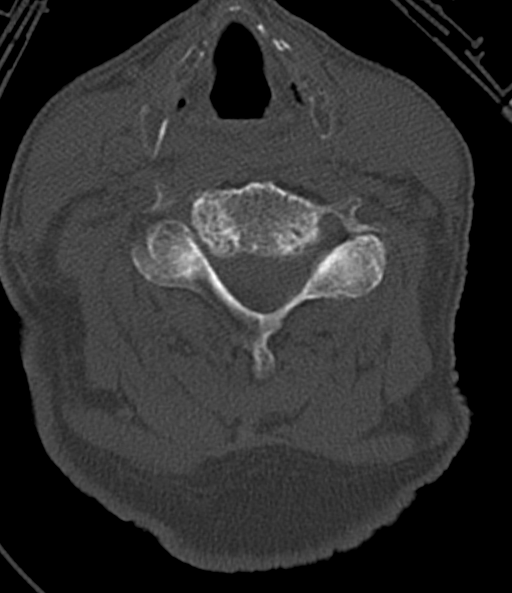
[im 55/92  bone]
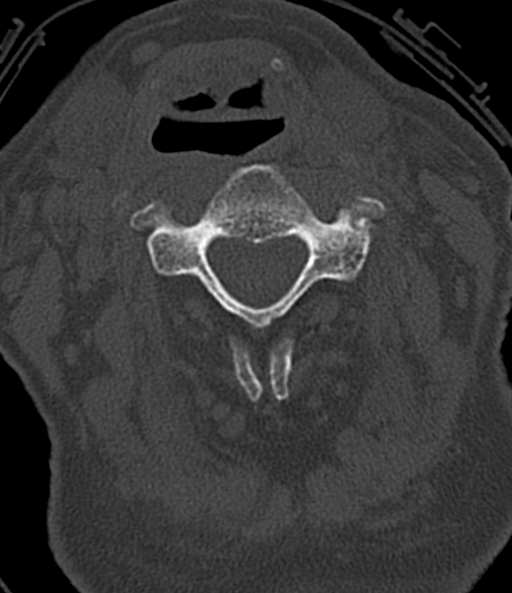
[im 73/92  bone]
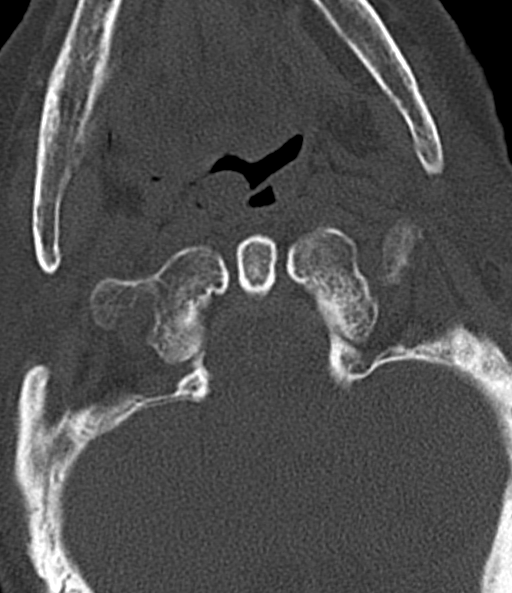

[Series 11: coronal bone · coronal · 0.28mm/px · 1 of 73 slices shown]
[im 37/73  bone]
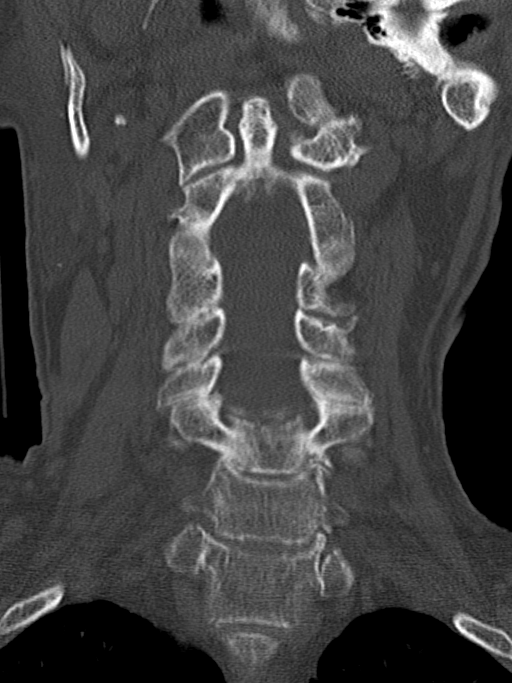

[Series 12: sagittal bone · sagittal · 0.30mm/px · 5 of 61 slices shown]
[im 11/61  bone]
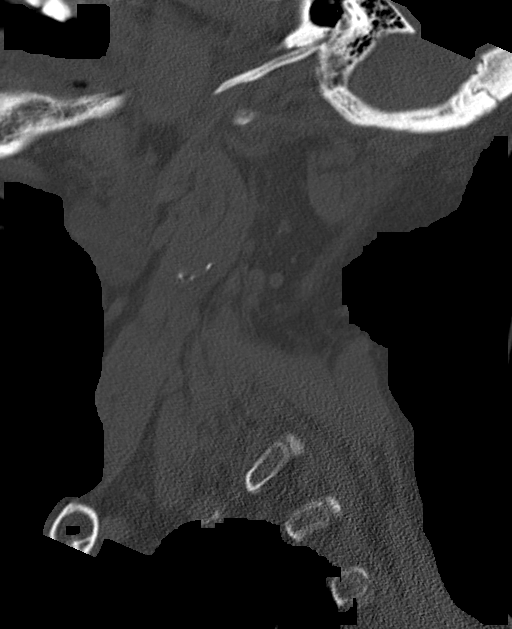
[im 21/61  bone]
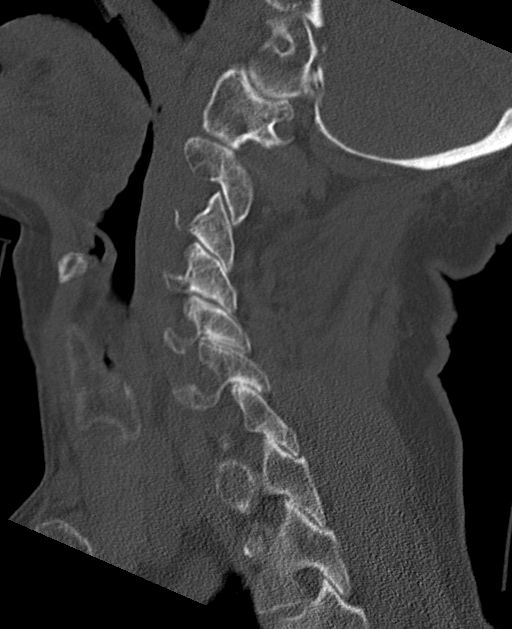
[im 31/61  bone]
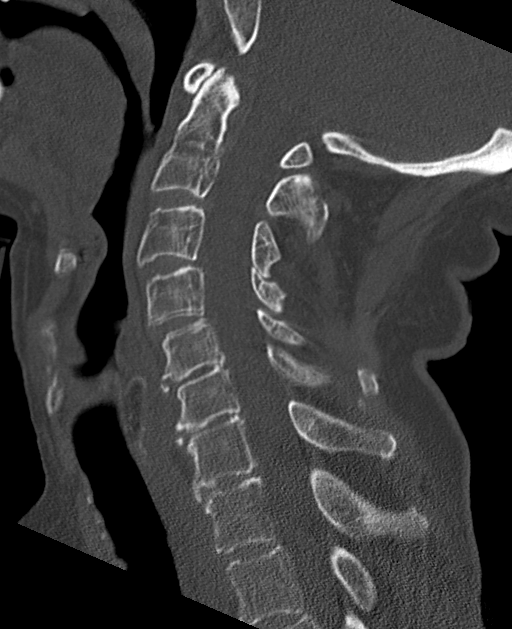
[im 41/61  bone]
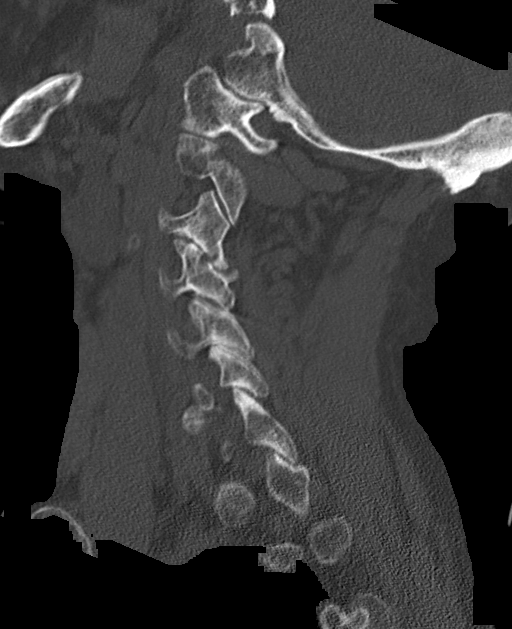
[im 51/61  bone]
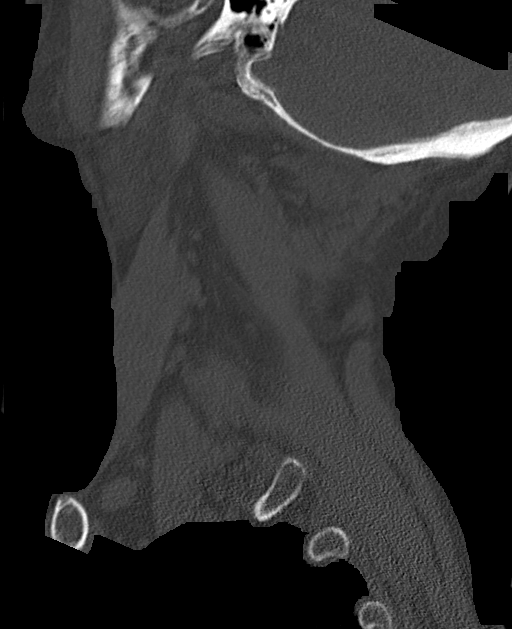

[14 of 33 positions shown; findings below may reference images not displayed]

FINDINGS: CT HEAD FINDINGS

Brain: No evidence of acute infarction, hemorrhage, hydrocephalus,
extra-axial collection or mass lesion/mass effect. Periventricular
and deep white matter hypodensity, with redemonstrated multifocal
encephalomalacia, including of the left frontal lobe, right caudate,
and left cerebellar hemisphere.

Vascular: No hyperdense vessel or unexpected calcification.

Skull: Normal. Negative for fracture or focal lesion.

Sinuses/Orbits: No acute finding.

Other: None.

CT CERVICAL SPINE FINDINGS

Alignment: Normal.

Skull base and vertebrae: No acute fracture. No primary bone lesion
or focal pathologic process.

Soft tissues and spinal canal: No prevertebral fluid or swelling. No
visible canal hematoma.

Disc levels: Mild to moderate multilevel disc space height loss and
osteophytosis.

Upper chest: Negative.

Other: None.
IMPRESSION: 1. No acute intracranial pathology.
2. Small-vessel white matter disease and redemonstrated multifocal
encephalomalacia.
3. No fracture or static subluxation of the cervical spine.
4. Mild to moderate multilevel disc space height loss and
osteophytosis.
